# Patient Record
Sex: Female | Born: 1960 | Race: Black or African American | Hispanic: No | Smoking: Never smoker
Health system: Southern US, Community
[De-identification: ages and names within clinical notes are randomized; demographics above are authoritative.]

## PROBLEM LIST (undated history)

## (undated) DIAGNOSIS — F32A Depression, unspecified: Secondary | ICD-10-CM

## (undated) DIAGNOSIS — R079 Chest pain, unspecified: Secondary | ICD-10-CM

## (undated) DIAGNOSIS — D649 Anemia, unspecified: Secondary | ICD-10-CM

## (undated) DIAGNOSIS — M1990 Unspecified osteoarthritis, unspecified site: Secondary | ICD-10-CM

## (undated) DIAGNOSIS — F329 Major depressive disorder, single episode, unspecified: Secondary | ICD-10-CM

## (undated) DIAGNOSIS — E559 Vitamin D deficiency, unspecified: Secondary | ICD-10-CM

## (undated) DIAGNOSIS — J45909 Unspecified asthma, uncomplicated: Secondary | ICD-10-CM

## (undated) DIAGNOSIS — D219 Benign neoplasm of connective and other soft tissue, unspecified: Secondary | ICD-10-CM

## (undated) DIAGNOSIS — G4700 Insomnia, unspecified: Secondary | ICD-10-CM

## (undated) DIAGNOSIS — R002 Palpitations: Secondary | ICD-10-CM

## (undated) DIAGNOSIS — K219 Gastro-esophageal reflux disease without esophagitis: Secondary | ICD-10-CM

## (undated) DIAGNOSIS — Z91018 Allergy to other foods: Secondary | ICD-10-CM

## (undated) DIAGNOSIS — R0602 Shortness of breath: Secondary | ICD-10-CM

## (undated) DIAGNOSIS — M2550 Pain in unspecified joint: Secondary | ICD-10-CM

## (undated) HISTORY — DX: Gastro-esophageal reflux disease without esophagitis: K21.9

## (undated) HISTORY — DX: Unspecified osteoarthritis, unspecified site: M19.90

## (undated) HISTORY — DX: Benign neoplasm of connective and other soft tissue, unspecified: D21.9

## (undated) HISTORY — DX: Depression, unspecified: F32.A

## (undated) HISTORY — DX: Vitamin D deficiency, unspecified: E55.9

## (undated) HISTORY — DX: Chest pain, unspecified: R07.9

## (undated) HISTORY — PX: ABDOMINAL HYSTERECTOMY: SHX81

## (undated) HISTORY — PX: KNEE SURGERY: SHX244

## (undated) HISTORY — PX: OOPHORECTOMY: SHX86

## (undated) HISTORY — DX: Pain in unspecified joint: M25.50

## (undated) HISTORY — PX: HERNIA REPAIR: SHX51

## (undated) HISTORY — DX: Anemia, unspecified: D64.9

## (undated) HISTORY — DX: Insomnia, unspecified: G47.00

## (undated) HISTORY — DX: Allergy to other foods: Z91.018

---
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
## 1999-08-27 ENCOUNTER — Other Ambulatory Visit: Admission: RE | Admit: 1999-08-27 | Discharge: 1999-08-27 | Payer: Self-pay | Admitting: *Deleted

## 1999-10-23 ENCOUNTER — Encounter (INDEPENDENT_AMBULATORY_CARE_PROVIDER_SITE_OTHER): Payer: Self-pay | Admitting: Specialist

## 1999-10-23 ENCOUNTER — Inpatient Hospital Stay (HOSPITAL_COMMUNITY): Admission: RE | Admit: 1999-10-23 | Discharge: 1999-10-24 | Payer: Self-pay | Admitting: *Deleted

## 2000-05-28 ENCOUNTER — Emergency Department (HOSPITAL_COMMUNITY): Admission: EM | Admit: 2000-05-28 | Discharge: 2000-05-28 | Payer: Self-pay | Admitting: *Deleted

## 2000-09-21 ENCOUNTER — Emergency Department (HOSPITAL_COMMUNITY): Admission: EM | Admit: 2000-09-21 | Discharge: 2000-09-21 | Payer: Self-pay | Admitting: Emergency Medicine

## 2000-11-28 ENCOUNTER — Encounter: Payer: Self-pay | Admitting: *Deleted

## 2000-11-28 ENCOUNTER — Encounter: Admission: RE | Admit: 2000-11-28 | Discharge: 2000-11-28 | Payer: Self-pay | Admitting: *Deleted

## 2000-11-28 ENCOUNTER — Other Ambulatory Visit: Admission: RE | Admit: 2000-11-28 | Discharge: 2000-11-28 | Payer: Self-pay | Admitting: *Deleted

## 2002-05-06 ENCOUNTER — Other Ambulatory Visit: Admission: RE | Admit: 2002-05-06 | Discharge: 2002-05-06 | Payer: Self-pay | Admitting: *Deleted

## 2003-05-30 ENCOUNTER — Other Ambulatory Visit: Admission: RE | Admit: 2003-05-30 | Discharge: 2003-05-30 | Payer: Self-pay | Admitting: *Deleted

## 2003-06-02 ENCOUNTER — Encounter: Payer: Self-pay | Admitting: *Deleted

## 2003-06-02 ENCOUNTER — Encounter: Admission: RE | Admit: 2003-06-02 | Discharge: 2003-06-02 | Payer: Self-pay | Admitting: *Deleted

## 2003-07-18 ENCOUNTER — Emergency Department (HOSPITAL_COMMUNITY): Admission: EM | Admit: 2003-07-18 | Discharge: 2003-07-19 | Payer: Self-pay | Admitting: Emergency Medicine

## 2004-06-17 ENCOUNTER — Emergency Department (HOSPITAL_COMMUNITY): Admission: EM | Admit: 2004-06-17 | Discharge: 2004-06-17 | Payer: Self-pay | Admitting: Emergency Medicine

## 2004-11-30 ENCOUNTER — Emergency Department (HOSPITAL_COMMUNITY): Admission: EM | Admit: 2004-11-30 | Discharge: 2004-11-30 | Payer: Self-pay | Admitting: Family Medicine

## 2005-08-02 ENCOUNTER — Encounter: Admission: RE | Admit: 2005-08-02 | Discharge: 2005-08-02 | Payer: Self-pay | Admitting: Internal Medicine

## 2006-09-05 ENCOUNTER — Observation Stay (HOSPITAL_COMMUNITY): Admission: EM | Admit: 2006-09-05 | Discharge: 2006-09-06 | Payer: Self-pay | Admitting: Family Medicine

## 2008-01-19 ENCOUNTER — Other Ambulatory Visit: Admission: RE | Admit: 2008-01-19 | Discharge: 2008-01-19 | Payer: Self-pay | Admitting: Internal Medicine

## 2008-03-02 ENCOUNTER — Encounter: Admission: RE | Admit: 2008-03-02 | Discharge: 2008-03-02 | Payer: Self-pay | Admitting: Internal Medicine

## 2009-03-03 ENCOUNTER — Encounter: Admission: RE | Admit: 2009-03-03 | Discharge: 2009-03-03 | Payer: Self-pay | Admitting: Internal Medicine

## 2009-03-06 ENCOUNTER — Other Ambulatory Visit: Admission: RE | Admit: 2009-03-06 | Discharge: 2009-03-06 | Payer: Self-pay | Admitting: Internal Medicine

## 2009-03-16 ENCOUNTER — Encounter: Admission: RE | Admit: 2009-03-16 | Discharge: 2009-03-16 | Payer: Self-pay | Admitting: Internal Medicine

## 2009-10-02 HISTORY — PX: CORONARY ANGIOGRAM: SHX5786

## 2009-10-22 ENCOUNTER — Observation Stay (HOSPITAL_COMMUNITY): Admission: EM | Admit: 2009-10-22 | Discharge: 2009-10-24 | Payer: Self-pay | Admitting: Emergency Medicine

## 2010-03-05 ENCOUNTER — Encounter: Admission: RE | Admit: 2010-03-05 | Discharge: 2010-03-05 | Payer: Self-pay | Admitting: Internal Medicine

## 2010-10-09 ENCOUNTER — Emergency Department (HOSPITAL_COMMUNITY): Admission: EM | Admit: 2010-10-09 | Discharge: 2010-10-09 | Payer: Self-pay | Admitting: Emergency Medicine

## 2011-02-12 LAB — POCT I-STAT, CHEM 8
BUN: 8 mg/dL (ref 6–23)
Calcium, Ion: 1.22 mmol/L (ref 1.12–1.32)
Chloride: 108 mEq/L (ref 96–112)
Creatinine, Ser: 0.9 mg/dL (ref 0.4–1.2)
Glucose, Bld: 93 mg/dL (ref 70–99)
HCT: 41 % (ref 36.0–46.0)
Hemoglobin: 13.9 g/dL (ref 12.0–15.0)
Potassium: 4.3 mEq/L (ref 3.5–5.1)
Sodium: 140 mEq/L (ref 135–145)
TCO2: 23 mmol/L (ref 0–100)

## 2011-02-12 LAB — DIFFERENTIAL
Basophils Absolute: 0 10*3/uL (ref 0.0–0.1)
Basophils Relative: 0 % (ref 0–1)
Eosinophils Absolute: 0.1 10*3/uL (ref 0.0–0.7)
Eosinophils Relative: 1 % (ref 0–5)
Lymphocytes Relative: 25 % (ref 12–46)
Lymphs Abs: 2.6 10*3/uL (ref 0.7–4.0)
Monocytes Absolute: 0.7 10*3/uL (ref 0.1–1.0)
Monocytes Relative: 7 % (ref 3–12)
Neutro Abs: 6.9 10*3/uL (ref 1.7–7.7)
Neutrophils Relative %: 67 % (ref 43–77)

## 2011-02-12 LAB — POCT CARDIAC MARKERS
CKMB, poc: <1 ng/mL — ABNORMAL LOW (ref 1.0–8.0)
CKMB, poc: <1 ng/mL — ABNORMAL LOW (ref 1.0–8.0)
Myoglobin, poc: 41.1 ng/mL (ref 12–200)
Myoglobin, poc: 41.9 ng/mL (ref 12–200)
Troponin i, poc: <0.05 ng/mL (ref 0.00–0.09)
Troponin i, poc: <0.05 ng/mL (ref 0.00–0.09)

## 2011-02-12 LAB — CBC
HCT: 37.5 % (ref 36.0–46.0)
Hemoglobin: 12.7 g/dL (ref 12.0–15.0)
MCH: 31.3 pg (ref 26.0–34.0)
MCHC: 33.9 g/dL (ref 30.0–36.0)
MCV: 92.4 fL (ref 78.0–100.0)
Platelets: 286 10*3/uL (ref 150–400)
RBC: 4.06 MIL/uL (ref 3.87–5.11)
RDW: 12.9 % (ref 11.5–15.5)
WBC: 10.3 10*3/uL (ref 4.0–10.5)

## 2011-02-13 ENCOUNTER — Other Ambulatory Visit: Payer: Self-pay | Admitting: Internal Medicine

## 2011-02-13 DIAGNOSIS — Z1231 Encounter for screening mammogram for malignant neoplasm of breast: Secondary | ICD-10-CM

## 2011-03-06 LAB — DIFFERENTIAL
Basophils Absolute: 0.1 10*3/uL (ref 0.0–0.1)
Basophils Relative: 1 % (ref 0–1)
Eosinophils Absolute: 0.1 10*3/uL (ref 0.0–0.7)
Eosinophils Relative: 1 % (ref 0–5)
Lymphocytes Relative: 30 % (ref 12–46)
Lymphs Abs: 2.9 10*3/uL (ref 0.7–4.0)
Monocytes Absolute: 0.5 10*3/uL (ref 0.1–1.0)
Monocytes Relative: 5 % (ref 3–12)
Neutro Abs: 6.2 10*3/uL (ref 1.7–7.7)
Neutrophils Relative %: 64 % (ref 43–77)

## 2011-03-06 LAB — CBC
HCT: 32.2 % — ABNORMAL LOW (ref 36.0–46.0)
HCT: 32.9 % — ABNORMAL LOW (ref 36.0–46.0)
HCT: 34.6 % — ABNORMAL LOW (ref 36.0–46.0)
Hemoglobin: 10.8 g/dL — ABNORMAL LOW (ref 12.0–15.0)
Hemoglobin: 11 g/dL — ABNORMAL LOW (ref 12.0–15.0)
Hemoglobin: 11.7 g/dL — ABNORMAL LOW (ref 12.0–15.0)
MCHC: 33.3 g/dL (ref 30.0–36.0)
MCHC: 33.6 g/dL (ref 30.0–36.0)
MCHC: 33.7 g/dL (ref 30.0–36.0)
MCV: 93.1 fL (ref 78.0–100.0)
MCV: 93.4 fL (ref 78.0–100.0)
MCV: 93.9 fL (ref 78.0–100.0)
Platelets: 294 10*3/uL (ref 150–400)
Platelets: 304 10*3/uL (ref 150–400)
Platelets: 310 10*3/uL (ref 150–400)
RBC: 3.44 MIL/uL — ABNORMAL LOW (ref 3.87–5.11)
RBC: 3.51 MIL/uL — ABNORMAL LOW (ref 3.87–5.11)
RBC: 3.72 MIL/uL — ABNORMAL LOW (ref 3.87–5.11)
RDW: 13.2 % (ref 11.5–15.5)
RDW: 13.5 % (ref 11.5–15.5)
RDW: 13.8 % (ref 11.5–15.5)
WBC: 9.3 10*3/uL (ref 4.0–10.5)
WBC: 9.4 10*3/uL (ref 4.0–10.5)
WBC: 9.7 10*3/uL (ref 4.0–10.5)

## 2011-03-06 LAB — TSH: TSH: 1.351 u[IU]/mL (ref 0.350–4.500)

## 2011-03-06 LAB — CK TOTAL AND CKMB (NOT AT ARMC)
CK, MB: 0.4 ng/mL (ref 0.3–4.0)
CK, MB: 0.4 ng/mL (ref 0.3–4.0)
CK, MB: 0.4 ng/mL (ref 0.3–4.0)
Relative Index: INVALID (ref 0.0–2.5)
Relative Index: INVALID (ref 0.0–2.5)
Relative Index: INVALID (ref 0.0–2.5)
Total CK: 51 U/L (ref 7–177)
Total CK: 55 U/L (ref 7–177)
Total CK: 59 U/L (ref 7–177)

## 2011-03-06 LAB — BASIC METABOLIC PANEL
BUN: 7 mg/dL (ref 6–23)
CO2: 25 mEq/L (ref 19–32)
Calcium: 8.9 mg/dL (ref 8.4–10.5)
Chloride: 106 mEq/L (ref 96–112)
Creatinine, Ser: 0.83 mg/dL (ref 0.4–1.2)
GFR calc Af Amer: >60 mL/min (ref >60)
GFR calc non Af Amer: >60 mL/min (ref >60)
Glucose, Bld: 89 mg/dL (ref 70–99)
Potassium: 3.8 mEq/L (ref 3.5–5.1)
Sodium: 138 mEq/L (ref 135–145)

## 2011-03-06 LAB — COMPREHENSIVE METABOLIC PANEL
ALT: 12 U/L (ref 0–35)
AST: 18 U/L (ref 0–37)
Albumin: 3.4 g/dL — ABNORMAL LOW (ref 3.5–5.2)
Alkaline Phosphatase: 61 U/L (ref 39–117)
BUN: 8 mg/dL (ref 6–23)
CO2: 26 mEq/L (ref 19–32)
Calcium: 8.6 mg/dL (ref 8.4–10.5)
Chloride: 109 mEq/L (ref 96–112)
Creatinine, Ser: 0.89 mg/dL (ref 0.4–1.2)
GFR calc Af Amer: >60 mL/min (ref >60)
GFR calc non Af Amer: >60 mL/min (ref >60)
Glucose, Bld: 109 mg/dL — ABNORMAL HIGH (ref 70–99)
Potassium: 3.4 mEq/L — ABNORMAL LOW (ref 3.5–5.1)
Sodium: 140 mEq/L (ref 135–145)
Total Bilirubin: 0.6 mg/dL (ref 0.3–1.2)
Total Protein: 6.4 g/dL (ref 6.0–8.3)

## 2011-03-06 LAB — POCT CARDIAC MARKERS
CKMB, poc: <1 ng/mL — ABNORMAL LOW (ref 1.0–8.0)
Myoglobin, poc: 43.6 ng/mL (ref 12–200)
Troponin i, poc: <0.05 ng/mL (ref 0.00–0.09)

## 2011-03-06 LAB — T4, FREE: Free T4: 1.07 ng/dL (ref 0.80–1.80)

## 2011-03-06 LAB — LIPID PANEL
Cholesterol: 132 mg/dL (ref 0–200)
HDL: 41 mg/dL (ref >39)
LDL Cholesterol: 82 mg/dL (ref 0–99)
Total CHOL/HDL Ratio: 3.2 RATIO
Triglycerides: 44 mg/dL (ref <150)
VLDL: 9 mg/dL (ref 0–40)

## 2011-03-06 LAB — APTT
aPTT: 39 seconds — ABNORMAL HIGH (ref 24–37)
aPTT: 40 seconds — ABNORMAL HIGH (ref 24–37)

## 2011-03-06 LAB — LIPASE, BLOOD: Lipase: 16 U/L (ref 11–59)

## 2011-03-06 LAB — D-DIMER, QUANTITATIVE: D-Dimer, Quant: 0.3 ug/mL-FEU (ref 0.00–0.48)

## 2011-03-06 LAB — AMYLASE: Amylase: 84 U/L (ref 27–131)

## 2011-03-12 ENCOUNTER — Ambulatory Visit
Admission: RE | Admit: 2011-03-12 | Discharge: 2011-03-12 | Disposition: A | Discharge status: Home / Self Care | Payer: BLUE CROSS/BLUE SHIELD | Source: Ambulatory Visit | Attending: Internal Medicine | Admitting: Internal Medicine

## 2011-03-12 DIAGNOSIS — Z1231 Encounter for screening mammogram for malignant neoplasm of breast: Secondary | ICD-10-CM

## 2011-04-19 NOTE — H&P (Signed)
Angela Mcconnell, UNGERER NO.:  [id number]   MEDICAL RECORD NO.:  [id number]          PATIENT TYPE:  OBV   LOCATION:  6526                         FACILITY:  [facility]   PHYSICIAN:  Jackie Plum, M.D.DATE OF BIRTH:  1961-04-26   DATE OF ADMISSION:  09/05/2006  DATE OF DISCHARGE:                                HISTORY & PHYSICAL   CHIEF COMPLAINT:  Chest pain.   HISTORY OF PRESENT ILLNESS:  The patient is a 50 year old lady without any  previous history of hypertension, diabetes, or dyslipidemia.  The patient  does not smoke cigarettes.  She has not had any heart problems before.  She  denies any history of reflux esophagitis or any reflux disease.  She says  she has had intermittent chest pain which is pressure like and has not had  any radiation.  No diaphoresis, dizziness, dyspnea, fever, or chills.  The  patient came to the ED whereupon, 12 lead EKG was done which showed sinus  rhythm at 68 beats per minute with right bundle branch block.  There was no  acute ST-T wave changes.  The patient, on an x-ray, was diagnosed with  cardiomegaly.  The patient was admitted for further evaluation.  I discussed  the patient with Dr. Elsie Lincoln of [facility] and he agrees that  the patient, who is chest pain free now, is OK for cycling of cardiac  enzymes and if these are negative, the patient can be referred to his  office.  The office has written out the patient's address and they are going  to try to arrange for a stress test as an outpatient if she rules out.   PAST MEDICAL HISTORY:  As noted above.   FAMILY HISTORY:  Positive for hypertension.  No family history of heart  disease.   MEDICATION HISTORY:  The patient is not on any medications at the moment.   ALLERGIES:  EGGS which causes hives.   PHYSICAL EXAMINATION:  VITAL SIGNS:  Blood pressure 124/33, pulse 71, respirations 20, temperature  98.1, O2 saturation 100%.  GENERAL:  Not in acute  cardiopulmonary distress.  HEENT:  Normocephalic, atraumatic, pupils equal, round, reactive to light.  EXTREMITIES:  No cyanosis, no edema.  ABDOMEN:  Soft, nontender.  Bowel sounds present.  LUNGS:  Clear to auscultation.  CARDIAC:  Regular rate and rhythm, no gallops or murmur.  CNS:  Alert and appropriate.   LABORATORY DATA:  EKG and x-ray as noted above.  WBC 10, hemoglobin 12.8,  hematocrit 38.3, MCV 95.4, platelet count 312.  Sodium 137, potassium 4.4,  chloride 107, CO2 25, glucose 89, BUN 8, creatinine 0.9, calcium 8.9.  Point  of care cardiac markers were negative.   IMPRESSION:  Chest pain, resolved.  The patient is admitted for cycling  cardiac enzymes.  If negative for MI, she will be discharged with  appointment to follow up with [facility] for possible stress  test.  We will check her lipid panel.      Jackie Plum, M.D.  Electronically Signed     GO/MEDQ  D:  09/05/2006  T:  09/06/2006  Job:  [id number]

## 2011-04-19 NOTE — Discharge Summary (Signed)
Angela Mcconnell, Angela [name]:  [id number]   MEDICAL RECORD NO.:  [id number]          PATIENT TYPE:  OBV   LOCATION:  3743                         FACILITY:  [facility]   PHYSICIAN:  Jackie Plum, M.D.DATE OF BIRTH:  10-14-1961   DATE OF ADMISSION:  09/05/2006  DATE OF DISCHARGE:  09/06/2006                                 DISCHARGE SUMMARY   DISCHARGE DIAGNOSES:  1. Chest pain, resolved.      a.     Serial cardiac enzymes negative.      b.     Patient is going to follow up with cardiology as an outpatient.  2. Abnormal electrocardiogram with right bundle-branch block.  Patient      follow up recommended, per cardiology.   DISCHARGE MEDICATIONS:  The patient is going to continue the home  medications of Imitrex, albuterol, B-complex, vitamin C as previously.  New  medicine is Protonix 40 mg daily.   REASON FOR ADMISSION:  Chest pain.  Angela Mcconnell is a very pleasant 50-year-  old African American lady who presented to the ER with complaint of chest  pain, which was atypical in presentation.  She does not have any known  history of diabetes, hypertension, dyslipidemia.  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  In the emergency room, 12-lead EKG showed right bundle-branch block  without any acute ST wave changes.  Her chest pain is said to have been  relieved with nitroglycerin, according to the ED doctor, and she is admitted  for further evaluation and management.  Her cardiac enzymes were serially  obtained, which came back negative for MI.  She is chest pain free this  morning.  I discussed the patient with Dr. Elsie Lincoln in the emergency room, who  agreed that patient would be okay for outpatient evaluation by cardiology on  discharge.  She is going to be discharged home on Protonix 40 mg daily and I  have asked her to take some time  off of work.  I also added Xanax, she  declined and said that she would be fine with just some days off.  Possibly,  her chest pain is related to anxiety.  We cannot rule out any GI source of  pain other cardiac source at this time.  She is going to be discharged to  follow up with Dr. Elsie Lincoln next week.  Patient is discharged in stable and  satisfactory condition.   Her discharge exam was unremarkable for cardiopulmonary examination.  She  was alert and appropriate.  Her abdomen was soft, nontender.  Extremities  had no cyanosis.  Her CBC and chemistries were not repeated.  Her total  cholesterol was133, triglycerides 70, HDL 58, LDL 81.   DISCHARGE VITAL SIGNS:  BP 115/60, pulse 81, respirations 20, temperature  97.7, O2 sat of 100% on room air.      Jackie Plum, M.D.  Electronically Signed     GO/MEDQ  D:  09/06/2006  T:  09/06/2006  Job:  [id number]   cc:   Madaline Savage, M.D.

## 2011-04-19 NOTE — Discharge Summary (Signed)
[facility]  Patient:    Angela Mcconnell                         MRN: [id number] Adm. Date:  10/23/99 Disc. Date: 10/24/99 Attending:  Georgina Peer, M.D.                           Discharge Summary  ADMISSION DIAGNOSES:          1. Uterine fibroids.                               2. Abdominal pain.                               3. Anemia.  DISCHARGE DIAGNOSES:          1. Uterine fibroids.                               2. Abdominal pain.                               3. Anemia.  PROCEDURE:                    Laparoscopically-assisted vaginal hysterectomy.  BRIEF HISTORY:                This is a 50 year old black female, gravida 3, para 2, with enlarging fibroids causing pelvic pain and heavy bleeding.  HOSPITAL COURSE:              The patient was admitted for a laparoscopically-assisted vaginal hysterectomy which she underwent this on November 21, under general anesthesia.  Estimated blood loss was 150 cc.  The operation as uneventful.  The patient had adequate pain relief and minimal bleeding.  She voided without difficulty.  On postoperative day #1, she was passing flatus, ambulating, and tolerating a regular diet.  Hemoglobin went from 12.0 to 11.2.  Abdomen was  soft and nontender.  Her incisional ports were intact.  She was discharged home to follow up in the office in two weeks.  She was advised to avoid heavy lifting, intercourse, and driving until her two-week visit. DD:  12/13/99 TD:  12/13/99 Job: [id number]

## 2011-05-28 ENCOUNTER — Encounter (HOSPITAL_COMMUNITY)
Admission: RE | Admit: 2011-05-28 | Discharge: 2011-05-28 | Disposition: A | Discharge status: Home / Self Care | Payer: BC Managed Care – PPO | Source: Ambulatory Visit | Attending: Obstetrics and Gynecology | Admitting: Obstetrics and Gynecology

## 2011-05-28 LAB — CBC
HCT: 35.5 % — ABNORMAL LOW (ref 36.0–46.0)
Hemoglobin: 11.6 g/dL — ABNORMAL LOW (ref 12.0–15.0)
MCH: 30.1 pg (ref 26.0–34.0)
MCHC: 32.7 g/dL (ref 30.0–36.0)
MCV: 92.2 fL (ref 78.0–100.0)
Platelets: 327 10*3/uL (ref 150–400)
RBC: 3.85 MIL/uL — ABNORMAL LOW (ref 3.87–5.11)
RDW: 14 % (ref 11.5–15.5)
WBC: 10.8 10*3/uL — ABNORMAL HIGH (ref 4.0–10.5)

## 2011-05-28 LAB — SURGICAL PCR SCREEN
MRSA, PCR: POSITIVE — AB
Staphylococcus aureus: POSITIVE — AB

## 2011-06-04 ENCOUNTER — Ambulatory Visit (HOSPITAL_COMMUNITY)
Admission: RE | Admit: 2011-06-04 | Discharge: 2011-06-04 | Disposition: A | Discharge status: Home / Self Care | Payer: BC Managed Care – PPO | Source: Ambulatory Visit | Attending: Obstetrics and Gynecology | Admitting: Obstetrics and Gynecology

## 2011-06-04 ENCOUNTER — Other Ambulatory Visit: Payer: Self-pay | Admitting: Obstetrics and Gynecology

## 2011-06-04 DIAGNOSIS — R1031 Right lower quadrant pain: Secondary | ICD-10-CM | POA: Insufficient documentation

## 2011-06-04 DIAGNOSIS — Z01818 Encounter for other preprocedural examination: Secondary | ICD-10-CM | POA: Insufficient documentation

## 2011-06-04 DIAGNOSIS — Z01812 Encounter for preprocedural laboratory examination: Secondary | ICD-10-CM | POA: Insufficient documentation

## 2011-06-04 DIAGNOSIS — N83209 Unspecified ovarian cyst, unspecified side: Secondary | ICD-10-CM | POA: Insufficient documentation

## 2011-06-04 LAB — URINALYSIS, ROUTINE W REFLEX MICROSCOPIC
Bilirubin Urine: NEGATIVE
Glucose, UA: NEGATIVE mg/dL
Hgb urine dipstick: NEGATIVE
Ketones, ur: NEGATIVE mg/dL
Leukocytes, UA: NEGATIVE
Nitrite: NEGATIVE
Protein, ur: NEGATIVE mg/dL
Specific Gravity, Urine: <1.005 — ABNORMAL LOW (ref 1.005–1.030)
Urobilinogen, UA: 0.2 mg/dL (ref 0.0–1.0)
pH: 7 (ref 5.0–8.0)

## 2011-06-04 LAB — BASIC METABOLIC PANEL
BUN: 9 mg/dL (ref 6–23)
CO2: 25 mEq/L (ref 19–32)
Calcium: 9.2 mg/dL (ref 8.4–10.5)
Chloride: 105 mEq/L (ref 96–112)
Creatinine, Ser: 0.95 mg/dL (ref 0.50–1.10)
GFR calc Af Amer: >60 mL/min (ref >60)
GFR calc non Af Amer: >60 mL/min (ref >60)
Glucose, Bld: 81 mg/dL (ref 70–99)
Potassium: 3.6 mEq/L (ref 3.5–5.1)
Sodium: 139 mEq/L (ref 135–145)

## 2011-06-08 NOTE — Op Note (Signed)
Angela [name]:  [id number]  MEDICAL RECORD NO.:  [id number]  LOCATION:  [facility]                          FACILITY:  [facility]  PHYSICIAN:  Randye Lobo, M.D.   DATE OF BIRTH:  [birth year]  DATE OF PROCEDURE:  06/04/2011 DATE OF DISCHARGE:                              OPERATIVE REPORT   PREOPERATIVE DIAGNOSIS:  Right lower quadrant pain.  POSTOPERATIVE DIAGNOSES: 1. Right lower quadrant pain 2. Pelvic adhesions.  PROCEDURES:  Laparoscopy with lysis of adhesions and bilateral salpingo- oophorectomy.  SURGEON:  Randye Lobo, MD  ASSISTANT:  Luvenia Redden, MD  ANESTHESIA:  General endotracheal, local with 0.25% Marcaine.  IV FLUIDS:  1300 mL Ringer's lactate.  ESTIMATED BLOOD LOSS:  Minimal.  URINE OUTPUT:  400 mL.  COMPLICATIONS:  None.  INDICATIONS FOR THE PROCEDURE:  The patient is a 50 year old gravida 3, para 1-1-1-2 African American female who presents with chronic right lower quadrant pain of several years' duration.  The patient is status post laparoscopically-assisted vaginal hysterectomy in 2001 for symptomatic uterine fibroids.  The patient's pain persists despite use of nonsteroidal anti-inflammatory medication and heat.  She has used oral contraceptive pills in the past.  The patient reports a history of chronic ovarian cysts.  The patient is now desiring surgical evaluation and treatment of pain and a plan is made to proceed with a laparoscopy with anticipated lysis of adhesions and bilateral salpingo-oophorectomy. Risks, benefits, and alternatives have been discussed with the patient who wishes to proceed.  FINDINGS:  Laparoscopy demonstrated an absent uterus.  There were dense adhesions between the omentum and the vaginal cuff.  Thin omental adhesions extended across the entire pelvic floor which appeared to be along the patient's previous surgical site.  There was a small left ovarian cyst.  The right ovary and bilateral  fallopian tubes were unremarkable.  The appendix was normal.  It was visualized all the way to the tips.  In the upper abdomen, the liver was unremarkable.  There was no evidence of any adhesive disease in the upper abdomen.  SPECIMENS:  The bilateral tubes and ovaries were sent to Pathology separately.  PROCEDURE IN DETAILS:  The patient was reidentified in the preoperative hold area.  She received Ancef IV for antibiotic prophylaxis.  She received TED hose and PAS stockings for DVT prophylaxis.  In the operating room, general endotracheal anesthesia was induced and the patient was placed in the dorsal lithotomy position.  The abdomen, vagina and perineum were all sterilely prepped and draped.  A Foley catheter was placed in the bladder and left to gravity drainage throughout the procedure.  A sponge on sponge stick was placed in the vagina for the procedure.  Attention was then turned to the abdomen where a transverse infraumbilical incision was created sharply with a scalpel.  An Allis clamp was used to dissect down to the fascia.  A 10-mm trocar was then placed into the peritoneal cavity without difficulty.  The laparoscope confirmed proper placement.  The patient was placed in the Trendelenburg position.  A 5 mm right and left lower quadrant incisions were created with scalpels and the trocars were placed under the direct  visualization of the laparoscope.  The procedure then began by performing a sharp lysis of adhesions of the omental adhesions from the vaginal cuff.  Monopolar cautery and the gyrus instrument were used to create hemostasis along the omental side of the dissection.  Care was taken to ensure that there was no bowel in the region of the dissection.  After the omentum was 100% freed from the vaginal cuff, attention was then turned to the adnexa regions.  The left ureter was identified.  The left infundibulopelvic ligament was then triply cauterized and cut  with the Gyrus instrument.  Dissection was continued through the broad ligament again using the Gyrus instrument for cautery and for cutting. The left tube and ovary were freed at this time and were placed inside the cul-de-sac.  The same procedure was then performed on the right-hand side after the right ureter had been identified.  The right lower quadrant incision was then enlarged to accommodate a 10/11-mm trocar which was placed under visualization of the laparoscope. The EndoCatch bag was then placed inside the peritoneal cavity and the adnexal structures were placed inside the EndoCatch bag and were removed from the right lower quadrant incision identified, and sent separately to Pathology.  The pelvis was then irrigated and suctioned at this time.  A few additional areas along the omentum were cauterized to create good hemostasis.  A piece of Interceed was then placed across the vaginal cuff.  All operative sites were hemostatic prior to placing this.  The lower abdominal trocars were removed under visualization of the laparoscope.  A CO2 pneumoperitoneum was released and the umbilical trocar and laparoscope were removed simultaneously.  The right lower quadrant incision was closed along the fascia with 2 figure-of-eight sutures of 0 Vicryl.  All skin incisions were closed with subcuticular sutures of 3-0 plain gut suture and Dermabond was placed over the incisions.  The vaginal sponge and sponge stick were removed from the vagina and a Foley catheter was taken out.  The patient was awakened, extubated and escorted to the recovery room in stable condition.  There were no complications to the procedure.  All needle, instrument, and sponge counts were correct.     Randye Lobo, M.D.     BES/MEDQ  D:  06/04/2011  T:  06/05/2011  Job:  [id number]  Electronically Signed by Conley Simmonds M.D. on 06/08/2011 09:46:39 AM

## 2012-02-07 ENCOUNTER — Other Ambulatory Visit: Payer: Self-pay | Admitting: Internal Medicine

## 2012-02-07 DIAGNOSIS — Z1231 Encounter for screening mammogram for malignant neoplasm of breast: Secondary | ICD-10-CM

## 2012-03-12 ENCOUNTER — Ambulatory Visit
Admission: RE | Admit: 2012-03-12 | Discharge: 2012-03-12 | Disposition: A | Discharge status: Home / Self Care | Payer: BC Managed Care – PPO | Source: Ambulatory Visit | Attending: Internal Medicine | Admitting: Internal Medicine

## 2012-03-12 DIAGNOSIS — Z1231 Encounter for screening mammogram for malignant neoplasm of breast: Secondary | ICD-10-CM

## 2012-09-11 ENCOUNTER — Emergency Department (HOSPITAL_COMMUNITY)
Admission: EM | Admit: 2012-09-11 | Discharge: 2012-09-11 | Disposition: A | Discharge status: Home / Self Care | Payer: BC Managed Care – PPO | Attending: Emergency Medicine | Admitting: Emergency Medicine

## 2012-09-11 ENCOUNTER — Encounter (HOSPITAL_COMMUNITY): Payer: Self-pay | Admitting: *Deleted

## 2012-09-11 DIAGNOSIS — N390 Urinary tract infection, site not specified: Secondary | ICD-10-CM | POA: Insufficient documentation

## 2012-09-11 DIAGNOSIS — Z91012 Allergy to eggs: Secondary | ICD-10-CM | POA: Insufficient documentation

## 2012-09-11 DIAGNOSIS — J45909 Unspecified asthma, uncomplicated: Secondary | ICD-10-CM | POA: Insufficient documentation

## 2012-09-11 HISTORY — DX: Unspecified asthma, uncomplicated: J45.909

## 2012-09-11 LAB — URINALYSIS, ROUTINE W REFLEX MICROSCOPIC
Glucose, UA: NEGATIVE mg/dL
Ketones, ur: 15 mg/dL — AB
Nitrite: POSITIVE — AB
Protein, ur: 100 mg/dL — AB
Specific Gravity, Urine: 1.017 (ref 1.005–1.030)
Urobilinogen, UA: 1 mg/dL (ref 0.0–1.0)
pH: 5 (ref 5.0–8.0)

## 2012-09-11 LAB — URINE MICROSCOPIC-ADD ON

## 2012-09-11 MED ORDER — SULFAMETHOXAZOLE-TMP DS 800-160 MG PO TABS
1.0000 | ORAL_TABLET | Freq: Once | ORAL | Status: AC
  Administered 2012-09-11: 1 via ORAL
  Filled 2012-09-11: qty 1

## 2012-09-11 MED ORDER — SULFAMETHOXAZOLE-TRIMETHOPRIM 800-160 MG PO TABS: 1.0000 | ORAL_TABLET | Freq: Two times a day (BID) | ORAL | Status: DC

## 2012-09-11 NOTE — ED Provider Notes (Signed)
History     CSN: [id number]  Arrival date & time 09/11/12  2841   First MD Initiated Contact with Patient 09/11/12 [phone]      Chief Complaint  Patient presents with  . Hematuria    (Consider location/radiation/quality/duration/timing/severity/associated sxs/prior treatment) HPI  51 year old female presents complaining of right flank pain and hematuria. Reports yesterday she noticed and achy and dull sensation to her right low back. Onset is gradual, wax and wane, does radiates to both thigh but has improved.  This AM she experience urinary urgency and frequency and begin to notice blood in her urine.  Initially it was trace of blood but it has increase and she is seeing small clots with it.  Denies vaginal bleeding, discharge, abd pain.  Denies fever, chills, n/v/d.  No significant hx of URI, no prior hx of kidney stone.  Currently denies any significant back pain or flank pain.  Pt has total hysterectomy.  Past Medical History  Diagnosis Date  . Asthma     Past Surgical History  Procedure Date  . Abdominal hysterectomy   . Knee surgery     No family history on file.  History  Substance Use Topics  . Smoking status: Not on file  . Smokeless tobacco: Not on file  . Alcohol Use: No    OB History    Grav Para Term Preterm Abortions TAB SAB Ect Mult Living                  Review of Systems  Constitutional: Negative for fever.  Gastrointestinal: Negative for abdominal pain, blood in stool and anal bleeding.  Genitourinary: Positive for dysuria and hematuria.  Skin: Negative for rash and wound.  All other systems reviewed and are negative.    Allergies  Eggs or egg-derived products  Home Medications  No current outpatient prescriptions on file.  BP 137/85  Pulse 83  Temp 98.8 F (37.1 C) (Oral)  Resp 22  SpO2 100%  Physical Exam  Nursing note and vitals reviewed. Constitutional: She appears well-developed and well-nourished. No distress.       Awake,  alert, nontoxic appearance  HENT:  Head: Atraumatic.  Eyes: Conjunctivae normal are normal. Right eye exhibits no discharge. Left eye exhibits no discharge.  Neck: Neck supple.  Cardiovascular: Normal rate and regular rhythm.   Pulmonary/Chest: Effort normal. No respiratory distress. She exhibits no tenderness.  Abdominal: Soft. There is no tenderness. There is no rebound.       No CVA tenderness  No suprapubic tenderness  Genitourinary:       deferred  Musculoskeletal: She exhibits no tenderness.       ROM appears intact, no obvious focal weakness.  No midline spine tenderness or step off  Neurological: She is alert.       Mental status and motor strength appears intact  Skin: No rash noted.  Psychiatric: She has a normal mood and affect.    ED Course  Procedures (including critical care time)   Labs Reviewed  CBC WITH DIFFERENTIAL  URINALYSIS, ROUTINE W REFLEX MICROSCOPIC   Results for orders placed during the hospital encounter of 09/11/12  URINALYSIS, ROUTINE W REFLEX MICROSCOPIC      Component Value Range   Color, Urine RED (*) YELLOW   APPearance CLOUDY (*) CLEAR   Specific Gravity, Urine 1.017  1.005 - 1.030   pH 5.0  5.0 - 8.0   Glucose, UA NEGATIVE  NEGATIVE mg/dL   Hgb urine dipstick  LARGE (*) NEGATIVE   Bilirubin Urine MODERATE (*) NEGATIVE   Ketones, ur 15 (*) NEGATIVE mg/dL   Protein, ur 130 (*) NEGATIVE mg/dL   Urobilinogen, UA 1.0  0.0 - 1.0 mg/dL   Nitrite POSITIVE (*) NEGATIVE   Leukocytes, UA LARGE (*) NEGATIVE  URINE MICROSCOPIC-ADD ON      Component Value Range   Squamous Epithelial / LPF RARE  RARE   WBC, UA 7-10  <3 WBC/hpf   RBC / HPF TOO NUMEROUS TO COUNT  <3 RBC/hpf   Bacteria, UA FEW (*) RARE   No results found.  1. UTI  MDM  Pt presents with Dysuria and hematuria x 1-2 days.  She has no significant CVA tenderness.  She has no reproducible abd pain.  Will check UA.  Pt otherwise afebrile, no acute distress.    8:59 AM UA positive  for UTI.  Doubt kidney stone.  Will treat with bactrim, and will send urine culture.  Pt otherwise stable to be d/c.   BP 137/85  Pulse 83  Temp 98.8 F (37.1 C) (Oral)  Resp 22  SpO2 100%  Nursing notes reviewed and considered in documentation  Previous records reviewed and considered  All labs/vitals reviewed and considered         Fayrene Helper, PA-C 09/11/12 0901  Fayrene Helper, PA-C 09/11/12 [id number]

## 2012-09-11 NOTE — ED Notes (Signed)
Pt reports hematuria that started this am.  Reports lower abdominal pain and (r) flank pain.  Reports pain with urination, frequency and urgency.  No acute distress noted.  Pt sitting calmly in bed.

## 2012-09-11 NOTE — Discharge Instructions (Signed)
Urinary Tract Infection Urinary tract infections (UTIs) can develop anywhere along your urinary tract. Your urinary tract is your body's drainage system for removing wastes and extra water. Your urinary tract includes two kidneys, two ureters, a bladder, and a urethra. Your kidneys are a pair of bean-shaped organs. Each kidney is about the size of your fist. They are located below your ribs, one on each side of your spine. CAUSES Infections are caused by microbes, which are microscopic organisms, including fungi, viruses, and bacteria. These organisms are so small that they can only be seen through a microscope. Bacteria are the microbes that most commonly cause UTIs. SYMPTOMS  Symptoms of UTIs may vary by age and gender of the patient and by the location of the infection. Symptoms in young women typically include a frequent and intense urge to urinate and a painful, burning feeling in the bladder or urethra during urination. Older women and men are more likely to be tired, shaky, and weak and have muscle aches and abdominal pain. A fever may mean the infection is in your kidneys. Other symptoms of a kidney infection include pain in your back or sides below the ribs, nausea, and vomiting. DIAGNOSIS To diagnose a UTI, your caregiver will ask you about your symptoms. Your caregiver also will ask to provide a urine sample. The urine sample will be tested for bacteria and white blood cells. White blood cells are made by your body to help fight infection. TREATMENT  Typically, UTIs can be treated with medication. Because most UTIs are caused by a bacterial infection, they usually can be treated with the use of antibiotics. The choice of antibiotic and length of treatment depend on your symptoms and the type of bacteria causing your infection. HOME CARE INSTRUCTIONS  If you were prescribed antibiotics, take them exactly as your caregiver instructs you. Finish the medication even if you feel better after you  have only taken some of the medication.  Drink enough water and fluids to keep your urine clear or pale yellow.  Avoid caffeine, tea, and carbonated beverages. They tend to irritate your bladder.  Empty your bladder often. Avoid holding urine for long periods of time.  Empty your bladder before and after sexual intercourse.  After a bowel movement, women should cleanse from front to back. Use each tissue only once. SEEK MEDICAL CARE IF:   You have back pain.  You develop a fever.  Your symptoms do not begin to resolve within 3 days. SEEK IMMEDIATE MEDICAL CARE IF:   You have severe back pain or lower abdominal pain.  You develop chills.  You have nausea or vomiting.  You have continued burning or discomfort with urination. MAKE SURE YOU:   Understand these instructions.  Will watch your condition.  Will get help right away if you are not doing well or get worse. Document Released: 08/28/2005 Document Revised: 05/19/2012 Document Reviewed: 12/27/2011 [employer] Patient Information 2013 [employer].  

## 2012-09-11 NOTE — ED Notes (Signed)
Pt has has right flank pain since yesterday.  When she woke up this am she had urinary frequency and noticed blood in her urine as well as clots.

## 2012-09-13 LAB — URINE CULTURE: Colony Count: >=100000

## 2012-09-13 NOTE — ED Provider Notes (Signed)
Medical screening examination/treatment/procedure(s) were performed by non-physician practitioner and as supervising physician I was immediately available for consultation/collaboration.  Darien Mignogna T Layann Bluett, MD 09/13/12 0856 

## 2012-09-14 NOTE — ED Notes (Signed)
+   Urine Patient treated with cipro(Home Medication)-STS

## 2012-11-11 ENCOUNTER — Encounter (HOSPITAL_COMMUNITY): Payer: Self-pay | Admitting: *Deleted

## 2012-11-11 ENCOUNTER — Emergency Department (HOSPITAL_COMMUNITY)
Admission: EM | Admit: 2012-11-11 | Discharge: 2012-11-11 | Disposition: A | Discharge status: Home / Self Care | Payer: BC Managed Care – PPO | Attending: Emergency Medicine | Admitting: Emergency Medicine

## 2012-11-11 ENCOUNTER — Other Ambulatory Visit: Payer: Self-pay

## 2012-11-11 ENCOUNTER — Emergency Department (HOSPITAL_COMMUNITY): Payer: BC Managed Care – PPO

## 2012-11-11 DIAGNOSIS — J209 Acute bronchitis, unspecified: Secondary | ICD-10-CM | POA: Insufficient documentation

## 2012-11-11 DIAGNOSIS — J3489 Other specified disorders of nose and nasal sinuses: Secondary | ICD-10-CM | POA: Insufficient documentation

## 2012-11-11 DIAGNOSIS — J45909 Unspecified asthma, uncomplicated: Secondary | ICD-10-CM | POA: Insufficient documentation

## 2012-11-11 DIAGNOSIS — Z90710 Acquired absence of both cervix and uterus: Secondary | ICD-10-CM | POA: Insufficient documentation

## 2012-11-11 DIAGNOSIS — R1033 Periumbilical pain: Secondary | ICD-10-CM | POA: Insufficient documentation

## 2012-11-11 DIAGNOSIS — R002 Palpitations: Secondary | ICD-10-CM | POA: Insufficient documentation

## 2012-11-11 DIAGNOSIS — J029 Acute pharyngitis, unspecified: Secondary | ICD-10-CM | POA: Insufficient documentation

## 2012-11-11 DIAGNOSIS — R112 Nausea with vomiting, unspecified: Secondary | ICD-10-CM | POA: Insufficient documentation

## 2012-11-11 DIAGNOSIS — R509 Fever, unspecified: Secondary | ICD-10-CM | POA: Insufficient documentation

## 2012-11-11 HISTORY — DX: Palpitations: R00.2

## 2012-11-11 LAB — COMPREHENSIVE METABOLIC PANEL
ALT: 13 U/L (ref 0–35)
AST: 19 U/L (ref 0–37)
Albumin: 3.8 g/dL (ref 3.5–5.2)
Alkaline Phosphatase: 76 U/L (ref 39–117)
BUN: 11 mg/dL (ref 6–23)
CO2: 22 mEq/L (ref 19–32)
Calcium: 9.4 mg/dL (ref 8.4–10.5)
Chloride: 106 mEq/L (ref 96–112)
Creatinine, Ser: 0.78 mg/dL (ref 0.50–1.10)
GFR calc Af Amer: >90 mL/min (ref >90)
GFR calc non Af Amer: >90 mL/min (ref >90)
Glucose, Bld: 83 mg/dL (ref 70–99)
Potassium: 3.6 mEq/L (ref 3.5–5.1)
Sodium: 140 mEq/L (ref 135–145)
Total Bilirubin: 0.3 mg/dL (ref 0.3–1.2)
Total Protein: 7.5 g/dL (ref 6.0–8.3)

## 2012-11-11 LAB — URINALYSIS, ROUTINE W REFLEX MICROSCOPIC
Bilirubin Urine: NEGATIVE
Glucose, UA: NEGATIVE mg/dL
Hgb urine dipstick: NEGATIVE
Ketones, ur: NEGATIVE mg/dL
Leukocytes, UA: NEGATIVE
Nitrite: NEGATIVE
Protein, ur: NEGATIVE mg/dL
Specific Gravity, Urine: 1.012 (ref 1.005–1.030)
Urobilinogen, UA: 0.2 mg/dL (ref 0.0–1.0)
pH: 8 (ref 5.0–8.0)

## 2012-11-11 LAB — LIPASE, BLOOD: Lipase: 30 U/L (ref 11–59)

## 2012-11-11 LAB — CBC WITH DIFFERENTIAL/PLATELET
Basophils Absolute: 0 10*3/uL (ref 0.0–0.1)
Basophils Relative: 0 % (ref 0–1)
Eosinophils Absolute: 0.4 10*3/uL (ref 0.0–0.7)
Eosinophils Relative: 5 % (ref 0–5)
HCT: 34.4 % — ABNORMAL LOW (ref 36.0–46.0)
Hemoglobin: 11.7 g/dL — ABNORMAL LOW (ref 12.0–15.0)
Lymphocytes Relative: 25 % (ref 12–46)
Lymphs Abs: 2.3 10*3/uL (ref 0.7–4.0)
MCH: 31.1 pg (ref 26.0–34.0)
MCHC: 34 g/dL (ref 30.0–36.0)
MCV: 91.5 fL (ref 78.0–100.0)
Monocytes Absolute: 0.7 10*3/uL (ref 0.1–1.0)
Monocytes Relative: 8 % (ref 3–12)
Neutro Abs: 5.6 10*3/uL (ref 1.7–7.7)
Neutrophils Relative %: 62 % (ref 43–77)
Platelets: 342 10*3/uL (ref 150–400)
RBC: 3.76 MIL/uL — ABNORMAL LOW (ref 3.87–5.11)
RDW: 14.2 % (ref 11.5–15.5)
WBC: 9.1 10*3/uL (ref 4.0–10.5)

## 2012-11-11 MED ORDER — AZITHROMYCIN 250 MG PO TABS: ORAL_TABLET | ORAL | Status: DC

## 2012-11-11 MED ORDER — SODIUM CHLORIDE 0.9 % IV SOLN
INTRAVENOUS | Status: DC
  Administered 2012-11-11: 09:00:00 via INTRAVENOUS

## 2012-11-11 MED ORDER — GUAIFENESIN-CODEINE 100-10 MG/5ML PO SYRP: 10.0000 mL | ORAL_SOLUTION | ORAL | Status: DC | PRN

## 2012-11-11 MED ORDER — GUAIFENESIN-CODEINE 100-10 MG/5ML PO SOLN
10.0000 mL | Freq: Once | ORAL | Status: AC
  Administered 2012-11-11: 10 mL via ORAL
  Filled 2012-11-11: qty 10

## 2012-11-11 MED ORDER — ONDANSETRON HCL 4 MG/2ML IJ SOLN
4.0000 mg | Freq: Once | INTRAMUSCULAR | Status: AC
  Administered 2012-11-11: 4 mg via INTRAVENOUS
  Filled 2012-11-11: qty 2

## 2012-11-11 MED ORDER — HYDROMORPHONE HCL PF 1 MG/ML IJ SOLN
1.0000 mg | Freq: Once | INTRAMUSCULAR | Status: AC
  Administered 2012-11-11: 1 mg via INTRAVENOUS
  Filled 2012-11-11: qty 1

## 2012-11-11 NOTE — ED Provider Notes (Signed)
History     CSN: [id number]  Arrival date & time 11/11/12  0704   None   7:57 AM In bathroom at 7:57 A.M.   Chief Complaint  Patient presents with  . Emesis  . heart racing     (Consider location/radiation/quality/duration/timing/severity/associated sxs/prior treatment) HPI Comments: 51 year old woman complains that she has had a cough, runny nose, and subjective sensation of fever since Sunday, 3 days ago. Last night she developed cramping abdominal pain and vomiting. She had taken some NyQuil, and then developed palpitations. She therefore sought evaluation.  Patient is a 51 y.o. female presenting with abdominal pain. The history is provided by the patient. No language interpreter was used.  Abdominal Pain The primary symptoms of the illness include abdominal pain, nausea and vomiting. The primary symptoms of the illness do not include diarrhea. The current episode started 13 to 24 hours ago. The onset of the illness was gradual. The problem has been rapidly worsening.  The abdominal pain began 13 to24 hours ago. The pain came on gradually. The abdominal pain has been gradually worsening since its onset. The abdominal pain is located in the periumbilical region. The abdominal pain does not radiate. The severity of the abdominal pain is 6/10. The abdominal pain is relieved by nothing. Exacerbated by: Nothing.  Associated with: Runny nose, sore throat, cough, for 3 days. The patient states that she believes she is currently not pregnant. The patient has not had a change in bowel habit. Risk factors for an acute abdominal problem include a history of abdominal surgery (Prior hysterectomy.). Additional symptoms associated with the illness include chills.    Past Medical History  Diagnosis Date  . Asthma   . Palpitations     Past Surgical History  Procedure Date  . Abdominal hysterectomy   . Knee surgery     No family history on file.  History  Substance Use Topics  . Smoking  status: Never Smoker   . Smokeless tobacco: Not on file  . Alcohol Use: No    OB History    Grav Para Term Preterm Abortions TAB SAB Ect Mult Living                  Review of Systems  Constitutional: Positive for chills.       Subjective sensation of fever.  HENT: Positive for sore throat and rhinorrhea.   Respiratory: Positive for cough.   Cardiovascular: Positive for palpitations.  Gastrointestinal: Positive for nausea, vomiting and abdominal pain. Negative for diarrhea.  Genitourinary: Negative.   Musculoskeletal: Negative.   Skin: Negative.   Neurological: Negative.   Psychiatric/Behavioral: Negative.     Allergies  Eggs or egg-derived products  Home Medications   Current Outpatient Rx  Name  Route  Sig  Dispense  Refill  . ESTRADIOL 0.05 MG/24HR TD PTTW   Transdermal   Place 1 patch onto the skin 2 (two) times a week.         . MULTI-VITAMIN/MINERALS PO TABS   Oral   Take 1 tablet by mouth daily.         . SULFAMETHOXAZOLE-TRIMETHOPRIM 800-160 MG PO TABS   Oral   Take 1 tablet by mouth 2 (two) times daily.   14 tablet   0     There were no vitals taken for this visit.  Physical Exam  Nursing note and vitals reviewed. Constitutional: She is oriented to person, place, and time. She appears well-developed and well-nourished.  In moderate distress with abdominal pain.  Has hacking cough.  HENT:  Head: Normocephalic and atraumatic.  Right Ear: External ear normal.  Left Ear: External ear normal.  Mouth/Throat: Oropharynx is clear and moist.  Eyes: Conjunctivae normal and EOM are normal. Pupils are equal, round, and reactive to light.  Neck: Normal range of motion. Neck supple.  Cardiovascular: Normal rate, regular rhythm and normal heart sounds.   Pulmonary/Chest: Effort normal and breath sounds normal.  Abdominal: Soft. Bowel sounds are normal.       Abdomen is soft, no mass or tenderness.  Bowel sounds are normal.    Musculoskeletal: Normal  range of motion.  Neurological: She is alert and oriented to person, place, and time.       No sensory or motor deficit.  Skin: Skin is warm and dry.  Psychiatric: She has a normal mood and affect. Her behavior is normal.    ED Course  Procedures (including critical care time)  7:14 AM  Date: 11/11/2012  Rate: 91  Rhythm: normal sinus rhythm  QRS Axis: normal  Intervals: normal  ST/T Wave abnormalities: normal  Conduction Disutrbances:right bundle branch block  Narrative Interpretation: Abnormal EKG  Old EKG Reviewed: none available  8:24 AM Pt seen --> physical exam performed.  Lab workup ordered.  IV Dilaudid and Zofran ordered for abdominal pain.  Robitussin AC for cough.  11:16 AM Results for orders placed during the hospital encounter of 11/11/12  CBC WITH DIFFERENTIAL      Component Value Range   WBC 9.1  4.0 - 10.5 K/uL   RBC 3.76 (*) 3.87 - 5.11 MIL/uL   Hemoglobin 11.7 (*) 12.0 - 15.0 g/dL   HCT 16.1 (*) 09.6 - 04.5 %   MCV 91.5  78.0 - 100.0 fL   MCH 31.1  26.0 - 34.0 pg   MCHC 34.0  30.0 - 36.0 g/dL   RDW 40.9  81.1 - 91.4 %   Platelets 342  150 - 400 K/uL   Neutrophils Relative 62  43 - 77 %   Neutro Abs 5.6  1.7 - 7.7 K/uL   Lymphocytes Relative 25  12 - 46 %   Lymphs Abs 2.3  0.7 - 4.0 K/uL   Monocytes Relative 8  3 - 12 %   Monocytes Absolute 0.7  0.1 - 1.0 K/uL   Eosinophils Relative 5  0 - 5 %   Eosinophils Absolute 0.4  0.0 - 0.7 K/uL   Basophils Relative 0  0 - 1 %   Basophils Absolute 0.0  0.0 - 0.1 K/uL  COMPREHENSIVE METABOLIC PANEL      Component Value Range   Sodium 140  135 - 145 mEq/L   Potassium 3.6  3.5 - 5.1 mEq/L   Chloride 106  96 - 112 mEq/L   CO2 22  19 - 32 mEq/L   Glucose, Bld 83  70 - 99 mg/dL   BUN 11  6 - 23 mg/dL   Creatinine, Ser 7.82  0.50 - 1.10 mg/dL   Calcium 9.4  8.4 - 95.6 mg/dL   Total Protein 7.5  6.0 - 8.3 g/dL   Albumin 3.8  3.5 - 5.2 g/dL   AST 19  0 - 37 U/L   ALT 13  0 - 35 U/L   Alkaline Phosphatase 76   39 - 117 U/L   Total Bilirubin 0.3  0.3 - 1.2 mg/dL   GFR calc non Af Amer >90  >90 mL/min  GFR calc Af Amer >90  >90 mL/min  LIPASE, BLOOD      Component Value Range   Lipase 30  11 - 59 U/L  URINALYSIS, ROUTINE W REFLEX MICROSCOPIC      Component Value Range   Color, Urine YELLOW  YELLOW   APPearance CLEAR  CLEAR   Specific Gravity, Urine 1.012  1.005 - 1.030   pH 8.0  5.0 - 8.0   Glucose, UA NEGATIVE  NEGATIVE mg/dL   Hgb urine dipstick NEGATIVE  NEGATIVE   Bilirubin Urine NEGATIVE  NEGATIVE   Ketones, ur NEGATIVE  NEGATIVE mg/dL   Protein, ur NEGATIVE  NEGATIVE mg/dL   Urobilinogen, UA 0.2  0.0 - 1.0 mg/dL   Nitrite NEGATIVE  NEGATIVE   Leukocytes, UA NEGATIVE  NEGATIVE   Dg Abd Acute W/chest  11/11/2012  *RADIOLOGY REPORT*  Clinical Data: Cough with fever.  Nausea and vomiting.  Mid abdominal cramping.  ACUTE ABDOMEN SERIES (ABDOMEN 2 VIEW & CHEST 1 VIEW)  Comparison: Chest x-ray from 10/09/2010  Findings: The lungs are clear without focal consolidation, edema, effusion or pneumothorax.  Cardiopericardial silhouette is within normal limits for size.  Imaged bony structures of the thorax are intact.  Upright film shows no evidence for intraperitoneal free air. Supine film shows no gaseous bowel dilatation to suggest obstruction.  No unexpected abdominopelvic calcification.  Several phleboliths are seen overlying the anatomic pelvis.  IMPRESSION: Normal chest x-ray.  No evidence for intraperitoneal free air or bowel obstruction.   Original Report Authenticated By: Kennith Center, M.D.     Lab tests were reassuringly normal.  Pt feeling better, cough better.  Rx for bronchitis with Z-pak, Robitussin AC.  1. Acute bronchitis         Carleene Cooper III, MD 11/11/12 1119

## 2012-11-11 NOTE — ED Notes (Signed)
Patient transported to X-ray 

## 2012-11-11 NOTE — ED Notes (Signed)
Pt is here with abdominal pain that started last nite.  Reported cold symptoms.  Started vomiting this am.  Pt reports heart is racing.

## 2012-11-11 NOTE — Discharge Instructions (Signed)

## 2013-02-02 ENCOUNTER — Other Ambulatory Visit: Payer: Self-pay

## 2013-02-02 DIAGNOSIS — Z1231 Encounter for screening mammogram for malignant neoplasm of breast: Secondary | ICD-10-CM

## 2013-03-15 ENCOUNTER — Ambulatory Visit
Admission: RE | Admit: 2013-03-15 | Discharge: 2013-03-15 | Disposition: A | Discharge status: Home / Self Care | Payer: BC Managed Care – PPO | Source: Ambulatory Visit

## 2013-03-15 DIAGNOSIS — Z1231 Encounter for screening mammogram for malignant neoplasm of breast: Secondary | ICD-10-CM

## 2013-04-01 ENCOUNTER — Other Ambulatory Visit: Payer: Self-pay | Admitting: Gastroenterology

## 2013-05-01 ENCOUNTER — Other Ambulatory Visit: Payer: Self-pay | Admitting: Obstetrics and Gynecology

## 2013-07-15 ENCOUNTER — Emergency Department (HOSPITAL_COMMUNITY): Payer: BC Managed Care – PPO

## 2013-07-15 ENCOUNTER — Emergency Department (HOSPITAL_COMMUNITY)
Admission: EM | Admit: 2013-07-15 | Discharge: 2013-07-15 | Disposition: A | Discharge status: Nursing Home (Includes ICF) | Payer: BC Managed Care – PPO | Source: Home / Self Care | Attending: Emergency Medicine | Admitting: Emergency Medicine

## 2013-07-15 ENCOUNTER — Encounter (HOSPITAL_COMMUNITY): Payer: Self-pay

## 2013-07-15 ENCOUNTER — Observation Stay (HOSPITAL_COMMUNITY): Payer: BC Managed Care – PPO

## 2013-07-15 ENCOUNTER — Observation Stay (HOSPITAL_COMMUNITY)
Admission: EM | Admit: 2013-07-15 | Discharge: 2013-07-16 | Disposition: A | Discharge status: Home / Self Care | Payer: BC Managed Care – PPO | Attending: Internal Medicine | Admitting: Internal Medicine

## 2013-07-15 ENCOUNTER — Encounter (HOSPITAL_COMMUNITY): Payer: Self-pay | Admitting: Emergency Medicine

## 2013-07-15 DIAGNOSIS — Z79899 Other long term (current) drug therapy: Secondary | ICD-10-CM | POA: Insufficient documentation

## 2013-07-15 DIAGNOSIS — R0609 Other forms of dyspnea: Secondary | ICD-10-CM | POA: Diagnosis present

## 2013-07-15 DIAGNOSIS — I4510 Unspecified right bundle-branch block: Secondary | ICD-10-CM | POA: Diagnosis present

## 2013-07-15 DIAGNOSIS — R079 Chest pain, unspecified: Principal | ICD-10-CM | POA: Diagnosis present

## 2013-07-15 DIAGNOSIS — IMO0001 Reserved for inherently not codable concepts without codable children: Secondary | ICD-10-CM

## 2013-07-15 DIAGNOSIS — I200 Unstable angina: Secondary | ICD-10-CM

## 2013-07-15 DIAGNOSIS — R0989 Other specified symptoms and signs involving the circulatory and respiratory systems: Secondary | ICD-10-CM | POA: Insufficient documentation

## 2013-07-15 DIAGNOSIS — R0600 Dyspnea, unspecified: Secondary | ICD-10-CM | POA: Insufficient documentation

## 2013-07-15 DIAGNOSIS — D649 Anemia, unspecified: Secondary | ICD-10-CM | POA: Diagnosis present

## 2013-07-15 DIAGNOSIS — J45909 Unspecified asthma, uncomplicated: Secondary | ICD-10-CM | POA: Diagnosis present

## 2013-07-15 HISTORY — DX: Shortness of breath: R06.02

## 2013-07-15 HISTORY — DX: Unspecified osteoarthritis, unspecified site: M19.90

## 2013-07-15 LAB — CBC
HCT: 32.1 % — ABNORMAL LOW (ref 36.0–46.0)
HCT: 30.4 % — ABNORMAL LOW (ref 36.0–46.0)
Hemoglobin: 11.1 g/dL — ABNORMAL LOW (ref 12.0–15.0)
Hemoglobin: 10.4 g/dL — ABNORMAL LOW (ref 12.0–15.0)
MCH: 30.9 pg (ref 26.0–34.0)
MCH: 30.6 pg (ref 26.0–34.0)
MCHC: 34.6 g/dL (ref 30.0–36.0)
MCHC: 34.2 g/dL (ref 30.0–36.0)
MCV: 89.4 fL (ref 78.0–100.0)
MCV: 89.4 fL (ref 78.0–100.0)
Platelets: 323 10*3/uL (ref 150–400)
Platelets: 316 10*3/uL (ref 150–400)
RBC: 3.59 MIL/uL — ABNORMAL LOW (ref 3.87–5.11)
RBC: 3.4 MIL/uL — ABNORMAL LOW (ref 3.87–5.11)
RDW: 14.1 % (ref 11.5–15.5)
RDW: 14.1 % (ref 11.5–15.5)
WBC: 8.1 10*3/uL (ref 4.0–10.5)
WBC: 9.4 10*3/uL (ref 4.0–10.5)

## 2013-07-15 LAB — D-DIMER, QUANTITATIVE: D-Dimer, Quant: <0.27 ug/mL-FEU (ref 0.00–0.48)

## 2013-07-15 LAB — T4, FREE: Free T4: 1.02 ng/dL (ref 0.80–1.80)

## 2013-07-15 LAB — BASIC METABOLIC PANEL
BUN: 12 mg/dL (ref 6–23)
CO2: 24 mEq/L (ref 19–32)
Calcium: 9.5 mg/dL (ref 8.4–10.5)
Chloride: 105 mEq/L (ref 96–112)
Creatinine, Ser: 0.9 mg/dL (ref 0.50–1.10)
GFR calc Af Amer: 85 mL/min — ABNORMAL LOW (ref >90)
GFR calc non Af Amer: 73 mL/min — ABNORMAL LOW (ref >90)
Glucose, Bld: 87 mg/dL (ref 70–99)
Potassium: 3.7 mEq/L (ref 3.5–5.1)
Sodium: 139 mEq/L (ref 135–145)

## 2013-07-15 LAB — TSH: TSH: 1.646 u[IU]/mL (ref 0.350–4.500)

## 2013-07-15 LAB — TROPONIN I
Troponin I: <0.3 ng/mL (ref <0.30)
Troponin I: <0.3 ng/mL (ref <0.30)

## 2013-07-15 LAB — CREATININE, SERUM
Creatinine, Ser: 0.84 mg/dL (ref 0.50–1.10)
GFR calc Af Amer: >90 mL/min (ref >90)
GFR calc non Af Amer: 80 mL/min — ABNORMAL LOW (ref >90)

## 2013-07-15 LAB — POCT I-STAT TROPONIN I: Troponin i, poc: 0 ng/mL (ref 0.00–0.08)

## 2013-07-15 LAB — MRSA PCR SCREENING: MRSA by PCR: NEGATIVE

## 2013-07-15 MED ORDER — ESTRADIOL 0.05 MG/24HR TD PTWK
0.0500 mg | MEDICATED_PATCH | TRANSDERMAL | Status: DC
  Filled 2013-07-15: qty 1

## 2013-07-15 MED ORDER — SODIUM CHLORIDE 0.9 % IV SOLN: 250.0000 mL | INTRAVENOUS | Status: DC | PRN

## 2013-07-15 MED ORDER — SODIUM CHLORIDE 0.9 % IJ SOLN: 3.0000 mL | Freq: Two times a day (BID) | INTRAMUSCULAR | Status: DC

## 2013-07-15 MED ORDER — ONDANSETRON HCL 4 MG PO TABS: 4.0000 mg | ORAL_TABLET | Freq: Four times a day (QID) | ORAL | Status: DC | PRN

## 2013-07-15 MED ORDER — ADULT MULTIVITAMIN W/MINERALS CH
1.0000 | ORAL_TABLET | Freq: Every day | ORAL | Status: DC
  Administered 2013-07-15 – 2013-07-16 (×2): 1 via ORAL
  Filled 2013-07-15 (×2): qty 1

## 2013-07-15 MED ORDER — GI COCKTAIL ~~LOC~~: 30.0000 mL | Freq: Two times a day (BID) | ORAL | Status: DC | PRN

## 2013-07-15 MED ORDER — ONDANSETRON HCL 4 MG/2ML IJ SOLN: 4.0000 mg | Freq: Four times a day (QID) | INTRAMUSCULAR | Status: DC | PRN

## 2013-07-15 MED ORDER — NITROGLYCERIN 0.4 MG SL SUBL
SUBLINGUAL_TABLET | SUBLINGUAL | Status: AC
  Filled 2013-07-15: qty 25

## 2013-07-15 MED ORDER — ASPIRIN 81 MG PO CHEW
CHEWABLE_TABLET | ORAL | Status: AC
  Filled 2013-07-15: qty 1

## 2013-07-15 MED ORDER — NITROGLYCERIN 0.4 MG SL SUBL
0.4000 mg | SUBLINGUAL_TABLET | SUBLINGUAL | Status: DC | PRN
  Administered 2013-07-15: 0.4 mg via SUBLINGUAL

## 2013-07-15 MED ORDER — ALBUTEROL SULFATE HFA 108 (90 BASE) MCG/ACT IN AERS
2.0000 | INHALATION_SPRAY | RESPIRATORY_TRACT | Status: DC | PRN
  Filled 2013-07-15: qty 6.7

## 2013-07-15 MED ORDER — NITROGLYCERIN 0.4 MG SL SUBL
0.4000 mg | SUBLINGUAL_TABLET | SUBLINGUAL | Status: AC | PRN
  Administered 2013-07-15: 0.4 mg via SUBLINGUAL

## 2013-07-15 MED ORDER — SENNOSIDES-DOCUSATE SODIUM 8.6-50 MG PO TABS
1.0000 | ORAL_TABLET | Freq: Every evening | ORAL | Status: DC | PRN
  Filled 2013-07-15: qty 1

## 2013-07-15 MED ORDER — HYDROCODONE-ACETAMINOPHEN 5-325 MG PO TABS
1.0000 | ORAL_TABLET | ORAL | Status: DC | PRN
  Administered 2013-07-15 (×2): 1 via ORAL
  Filled 2013-07-15: qty 1
  Filled 2013-07-15: qty 2

## 2013-07-15 MED ORDER — HYDROCOD POLST-CHLORPHEN POLST 10-8 MG/5ML PO LQCR
5.0000 mL | Freq: Once | ORAL | Status: AC
Start: 2013-07-15 — End: 2013-07-15
  Administered 2013-07-15: 5 mL via ORAL
  Filled 2013-07-15: qty 5

## 2013-07-15 MED ORDER — MORPHINE SULFATE 2 MG/ML IJ SOLN
2.0000 mg | INTRAMUSCULAR | Status: DC | PRN
  Administered 2013-07-15 – 2013-07-16 (×2): 2 mg via INTRAVENOUS
  Filled 2013-07-15 (×2): qty 1

## 2013-07-15 MED ORDER — GUAIFENESIN ER 600 MG PO TB12
1200.0000 mg | ORAL_TABLET | Freq: Two times a day (BID) | ORAL | Status: DC | PRN
  Filled 2013-07-15: qty 2

## 2013-07-15 MED ORDER — NITROGLYCERIN 0.4 MG SL SUBL: 0.4000 mg | SUBLINGUAL_TABLET | SUBLINGUAL | Status: DC | PRN

## 2013-07-15 MED ORDER — ENOXAPARIN SODIUM 40 MG/0.4ML ~~LOC~~ SOLN
40.0000 mg | SUBCUTANEOUS | Status: DC
  Administered 2013-07-15: 40 mg via SUBCUTANEOUS
  Filled 2013-07-15 (×2): qty 0.4

## 2013-07-15 MED ORDER — ASPIRIN 81 MG PO CHEW
324.0000 mg | CHEWABLE_TABLET | Freq: Once | ORAL | Status: AC
  Administered 2013-07-15: 324 mg via ORAL

## 2013-07-15 MED ORDER — ALUM & MAG HYDROXIDE-SIMETH 200-200-20 MG/5ML PO SUSP: 30.0000 mL | Freq: Four times a day (QID) | ORAL | Status: DC | PRN

## 2013-07-15 MED ORDER — IOHEXOL 350 MG/ML SOLN
100.0000 mL | Freq: Once | INTRAVENOUS | Status: AC | PRN
  Administered 2013-07-15: 100 mL via INTRAVENOUS

## 2013-07-15 MED ORDER — ALBUTEROL SULFATE (5 MG/ML) 0.5% IN NEBU
2.5000 mg | INHALATION_SOLUTION | Freq: Once | RESPIRATORY_TRACT | Status: AC
  Administered 2013-07-15: 2.5 mg via RESPIRATORY_TRACT
  Filled 2013-07-15: qty 0.5

## 2013-07-15 MED ORDER — ASPIRIN 81 MG PO CHEW
CHEWABLE_TABLET | ORAL | Status: AC
  Filled 2013-07-15: qty 4

## 2013-07-15 MED ORDER — SODIUM CHLORIDE 0.9 % IV SOLN
INTRAVENOUS | Status: DC
  Administered 2013-07-15: 11:00:00 via INTRAVENOUS

## 2013-07-15 MED ORDER — PANTOPRAZOLE SODIUM 40 MG PO TBEC
40.0000 mg | DELAYED_RELEASE_TABLET | Freq: Every day | ORAL | Status: DC
  Administered 2013-07-15 – 2013-07-16 (×2): 40 mg via ORAL
  Filled 2013-07-15 (×2): qty 1

## 2013-07-15 MED ORDER — ACETAMINOPHEN 650 MG RE SUPP: 650.0000 mg | Freq: Four times a day (QID) | RECTAL | Status: DC | PRN

## 2013-07-15 MED ORDER — ASPIRIN EC 81 MG PO TBEC
81.0000 mg | DELAYED_RELEASE_TABLET | Freq: Every day | ORAL | Status: DC
  Administered 2013-07-16: 81 mg via ORAL
  Filled 2013-07-15 (×2): qty 1

## 2013-07-15 MED ORDER — SODIUM CHLORIDE 0.9 % IJ SOLN
3.0000 mL | Freq: Two times a day (BID) | INTRAMUSCULAR | Status: DC
  Administered 2013-07-15 – 2013-07-16 (×2): 3 mL via INTRAVENOUS

## 2013-07-15 MED ORDER — SODIUM CHLORIDE 0.9 % IJ SOLN: 3.0000 mL | INTRAMUSCULAR | Status: DC | PRN

## 2013-07-15 MED ORDER — ACETAMINOPHEN 325 MG PO TABS: 650.0000 mg | ORAL_TABLET | Freq: Four times a day (QID) | ORAL | Status: DC | PRN

## 2013-07-15 NOTE — H&P (Addendum)
[facility] History and Physical  Angela Mcconnell ZOX:[id number] DOB: [birth year] DOA: 07/15/2013  Referring physician: EDP PCP: No PCP Per Patient  Specialists: [facility]  Chief Complaint: chest pain and shortness of breath  HPI: Angela Mcconnell is a 52 y.o. female with a history of asthma and right bundle branch block, who had a clean cardiac cath in 10/2009 by [facility].  She presented to [facility] this am with complaints of chest pain and shortness of breath.  Per Angela Mcconnell, Sunday and Monday she felt very fatigued and developed a dry cough which she attributed to seasonal allergies.  On Tuesday night she was awakened from sleep with sudden shortness of breath and mid sternal chest pain.  She had to sit up for the rest of the night.  She normally exercises (water aerobics) and takes the stairs every day at work.  She noticed decreased exercise tolerance this week in that she could not climb the stairs.  Wednesday night she experienced PND again and noticed chest pain radiating to her right jaw and right arm.  She went to urgent care this morning and was sent to the emergency department for further work up.  She does not drink alcohol or smoke tobacco.  She does not have DM, HTN, HLD.  She is not obese.  She has had no recent travel or surgery, but she is sedentary at work and has an estrogen patch.  POC troponin is negative, EKG shows pre-existing RBBB, D-Dimer and CT Angio Chest are ordered.  Review of Systems: The patient denies anorexia, fever, weight loss,, vision loss, decreased hearing, hoarseness, abdominal pain, melena, hematochezia, severe indigestion/heartburn, hematuria, incontinence, genital sores, muscle weakness, suspicious skin lesions, transient blindness, difficulty walking, depression, unusual weight change, abnormal bleeding.  She endorses some lower extremity swelling bilaterally and fatigue.   Past Medical History  Diagnosis Date  . Asthma   . Palpitations    Past  Surgical History  Procedure Laterality Date  . Abdominal hysterectomy    . Knee surgery     Social History:  reports that she has never smoked. She does not have any smokeless tobacco history on file. She reports that she does not drink alcohol or use illicit drugs.  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   Allergies  Allergen Reactions  . Eggs Or Egg-Derived Products     Hives, mouth and throat itching    History reviewed. No pertinent family history.  Mother with   Prior to Admission medications   Medication Sig Start Date End Date Taking? Authorizing Provider  BIOTIN PO Take 1 tablet by mouth daily.   Yes Historical Provider, MD  Ephedrine-Guaifenesin (BRONKAID PO) Take 1 tablet by mouth daily as needed (for difficulty breathing).    Yes Historical Provider, MD  estradiol (VIVELLE-DOT) 0.05 MG/24HR Place 1 patch onto the skin 2 (two) times a week. On Saturday and Wednesday   Yes Historical Provider, MD  guaiFENesin (MUCINEX) 600 MG 12 hr tablet Take 1,200 mg by mouth 2 (two) times daily as needed for congestion.   Yes Historical Provider, MD  Multiple Vitamins-Minerals (MULTIVITAMIN WITH MINERALS) tablet Take 1 tablet by mouth daily.   Yes Historical Provider, MD   Physical Exam: Filed Vitals:   07/15/13 1300  BP: 133/84  Pulse: 78  Temp:   Resp: 11     General:  Wd, wn, aa female, slightly tremulous  Eyes: PEERLA  ENT: oropharynx with out exudates or erythema  Neck:  supple, no jvd, no lymphadenopathy  Cardiovascular: rrr, no m/r/g, no LE edema. Chest non tender to palpation.    Respiratory: CTA  Abdomen: soft, nt, nd, +BS, no masses  Skin: no rashes, bruises, or lesions  Musculoskeletal: 5/5  Psychiatric: A&O, well groomed, cooperative  Neurologic: cn 2-12 grossly in tact, non focal  Labs on Admission:  Basic Metabolic Panel:  Recent Labs Lab 07/15/13 1136  NA 139  K 3.7  CL 105  CO2 24  GLUCOSE 87  BUN 12  CREATININE 0.90   CALCIUM 9.5   CBC:  Recent Labs Lab 07/15/13 1136  WBC 8.1  HGB 11.1*  HCT 32.1*  MCV 89.4  PLT 323    Radiological Exams on Admission: Dg Chest Port 1 View  07/15/2013   *RADIOLOGY REPORT*  Clinical Data: Mid chest pain with difficulty breathing.  Coughing.  PORTABLE CHEST - 1 VIEW  Comparison: 11/11/2012.  Findings: Trachea is midline.  Heart size normal.  Lungs are clear. No pleural fluid.  IMPRESSION: No acute findings.   Original Report Authenticated By: Leanna Battles, M.D.    EKG: Independently reviewed. RBBB  Assessment/Plan Active Problems:   Chest pain at rest   PND (paroxysmal nocturnal dyspnea)   Asthma  Chest pain with shortness of breath Will rule out ACS and PE.  Does not appear to be GI related or musculo-skeletal  Admit to cardiac tele / observation T1 q 6 hours x 3, TSH 2D Echo CT Angio Chest SL Nitro PRN, Protonix, Maalox / GI Cocktail.  Asthma Stable  CXR Clear, exam clear PRN albuterol inhaler.  Code Status: full Family Communication: daughter at bedside Disposition Plan:  Observation   Time spent: 60 min.  Conley Canal [facility] Pager [phone]  If 7PM-7AM, please contact night-coverage [email or web address] Password [facility] 07/15/2013, 1:59 PM   Patient seen and examined. Agree with above note, assessment and plan by Remigio Eisenmenger, PA. Patient here with CP and SOB. Doubt cardiac in etiology given her clean cath in 2010. Suspicious for PE given her sedentary lifestyle and the way SOB plays a prominent role. She is also on HRT with estrogen. Will check CT Angio. If positive will need to discuss anticoagulation and cessation of HRT. Will also check TSH to r/o hypo/hyperthyroidism. Can probably go home in am if rules out and CT Angio is negative.  Peggye Pitt, MD [facility] Pager: [phone]

## 2013-07-15 NOTE — ED Notes (Signed)
Notified dr Lorenz Coaster of patient complaints

## 2013-07-15 NOTE — ED Notes (Signed)
Patient reports chest pressure, dry cough.  Patient reports dry cough started Tuesday, denies cold symptoms.  Reports pressure in chest.  "someone standing on chest" .  Pressure in chest, right arm tightness.  Cough worse with lying down, climbing stairs, and with water aerobics she felt like water was heavy on chest.  Patient took "bronkaid" and reports this made her heart speed up last night.  Patient reports similar episode approx 2 years ago and had cardiac work up, but "everything fine".

## 2013-07-15 NOTE — ED Notes (Signed)
Phlebotomy back at the bedside. Physician at the bedside.

## 2013-07-15 NOTE — ED Provider Notes (Signed)
CSN: [id number]     Arrival date & time 07/15/13  1048 History     First MD Initiated Contact with Patient 07/15/13 1144     Chief Complaint  Patient presents with  . Chest Pain   (Consider location/radiation/quality/duration/timing/severity/associated sxs/prior Treatment) HPI Comments: 52 year old African American female presents emergency polyp chief complaint of chest pain. Patient was seen in urgent care today and transferred to the emergency department for further evaluation and management. Patient received full dose aspirin at the urgent care clinic and was given nitroglycerin and her pain improved.  Patient is a 52 y.o. female presenting with chest pain. The history is provided by the patient.  Chest Pain Pain location:  R chest Pain quality: aching and radiating   Pain quality: not burning, not crushing, not dull, not hot, no pressure, not sharp and not shooting   Pain radiates to:  R arm Pain radiates to the back: no   Pain severity:  Mild Onset quality:  Gradual Timing:  Intermittent Progression:  Waxing and waning Chronicity:  New Context: breathing   Context: no drug use, not eating, no intercourse, not lifting, no movement, not raising an arm, not at rest, no stress and no trauma   Relieved by:  None tried Worsened by:  Nothing tried Ineffective treatments:  None tried Associated symptoms: shortness of breath   Associated symptoms: no abdominal pain, no altered mental status, no anxiety, no back pain, no diaphoresis, no fatigue and no palpitations   Associated symptoms comment:  Patient has a history of asthma and thought that her current symptoms were related to her asthma. She does not take any outpatient medications for her asthma the   Past Medical History  Diagnosis Date  . Asthma   . Palpitations    Past Surgical History  Procedure Laterality Date  . Abdominal hysterectomy    . Knee surgery     History reviewed. No pertinent family history. History   Substance Use Topics  . Smoking status: Never Smoker   . Smokeless tobacco: Not on file  . Alcohol Use: No   OB History   Grav Para Term Preterm Abortions TAB SAB Ect Mult Living                 Review of Systems  Constitutional: Positive for activity change. Negative for chills, diaphoresis, appetite change and fatigue.  HENT: Positive for neck pain. Negative for hearing loss, ear pain, facial swelling and ear discharge.   Eyes: Negative.  Negative for photophobia, pain, redness and visual disturbance.  Respiratory: Positive for chest tightness and shortness of breath. Negative for apnea.        Patient reports a subjective shortness of breath that she attributed to her asthma  Cardiovascular: Positive for chest pain. Negative for palpitations and leg swelling.  Gastrointestinal: Positive for blood in stool. Negative for abdominal pain, constipation and anal bleeding.  Endocrine: Negative.   Genitourinary: Negative.   Musculoskeletal: Negative for back pain.  Neurological: Negative.     Allergies  Eggs or egg-derived products  Home Medications   Current Outpatient Rx  Name  Route  Sig  Dispense  Refill  . BIOTIN PO   Oral   Take 1 tablet by mouth daily.         Marland Kitchen Ephedrine-Guaifenesin (BRONKAID PO)   Oral   Take 1 tablet by mouth daily as needed (for difficulty breathing).          Marland Kitchen estradiol (VIVELLE-DOT) 0.05 MG/24HR  Transdermal   Place 1 patch onto the skin 2 (two) times a week. On Saturday and Wednesday         . guaiFENesin (MUCINEX) 600 MG 12 hr tablet   Oral   Take 1,200 mg by mouth 2 (two) times daily as needed for congestion.         . Multiple Vitamins-Minerals (MULTIVITAMIN WITH MINERALS) tablet   Oral   Take 1 tablet by mouth daily.          BP 134/73  Pulse 69  Temp(Src) 97.9 F (36.6 C) (Oral)  Resp 11  Ht 5\' 3"  (1.6 m)  Wt 179 lb (81.194 kg)  BMI 31.72 kg/m2  SpO2 100% Physical Exam  Constitutional: She is oriented to  person, place, and time. She appears well-developed and well-nourished.  HENT:  Head: Normocephalic and atraumatic.  Eyes: Conjunctivae and EOM are normal. Pupils are equal, round, and reactive to light.  Neck: Normal range of motion. Neck supple.  Cardiovascular: Normal rate, regular rhythm and normal heart sounds.   Pulmonary/Chest: Effort normal and breath sounds normal.  Abdominal: Soft. Bowel sounds are normal. She exhibits no distension. There is no tenderness. There is no rebound and no guarding.  Musculoskeletal: Normal range of motion. She exhibits no edema and no tenderness.  Neurological: She is alert and oriented to person, place, and time. She has normal reflexes.    ED Course   Procedures (including critical care time)  Labs Reviewed  CBC - Abnormal; Notable for the following:    RBC 3.59 (*)    Hemoglobin 11.1 (*)    HCT 32.1 (*)    All other components within normal limits  BASIC METABOLIC PANEL - Abnormal; Notable for the following:    GFR calc non Af Amer 73 (*)    GFR calc Af Amer 85 (*)    All other components within normal limits  TROPONIN I  TROPONIN I  TROPONIN I  POCT I-STAT TROPONIN I   Dg Chest Port 1 View  07/15/2013   *RADIOLOGY REPORT*  Clinical Data: Mid chest pain with difficulty breathing.  Coughing.  PORTABLE CHEST - 1 VIEW  Comparison: 11/11/2012.  Findings: Trachea is midline.  Heart size normal.  Lungs are clear. No pleural fluid.  IMPRESSION: No acute findings.   Original Report Authenticated By: Leanna Battles, M.D.    Results for orders placed during the hospital encounter of 07/15/13  CBC      Result Value Range   WBC 8.1  4.0 - 10.5 K/uL   RBC 3.59 (*) 3.87 - 5.11 MIL/uL   Hemoglobin 11.1 (*) 12.0 - 15.0 g/dL   HCT 16.1 (*) 09.6 - 04.5 %   MCV 89.4  78.0 - 100.0 fL   MCH 30.9  26.0 - 34.0 pg   MCHC 34.6  30.0 - 36.0 g/dL   RDW 40.9  81.1 - 91.4 %   Platelets 323  150 - 400 K/uL  BASIC METABOLIC PANEL      Result Value Range    Sodium 139  135 - 145 mEq/L   Potassium 3.7  3.5 - 5.1 mEq/L   Chloride 105  96 - 112 mEq/L   CO2 24  19 - 32 mEq/L   Glucose, Bld 87  70 - 99 mg/dL   BUN 12  6 - 23 mg/dL   Creatinine, Ser 7.82  0.50 - 1.10 mg/dL   Calcium 9.5  8.4 - 95.6 mg/dL   GFR calc non  Af Amer 73 (*) >90 mL/min   GFR calc Af Amer 85 (*) >90 mL/min  POCT I-STAT TROPONIN I      Result Value Range   Troponin i, poc 0.00  0.00 - 0.08 ng/mL   Comment 3              Date: 07/15/2013  Rate: 71  Rhythm: normal sinus rhythm  QRS Axis: normal  Intervals: normal  ST/T Wave abnormalities: nonspecific ST changes  Conduction Disutrbances:right bundle branch block  Narrative Interpretation:   Old EKG Reviewed: unchanged  12:56 PM VSS, Pt in nad, plan for admit for r/o acs.  Discussed with medicine.  Pt is aware.    No diagnosis found.  MDM  52 year old female presents emergency department with chest pain which radiated to right arm and right jaw. Symptoms were improved with nitroglycerin and aspirin. Patient was transferred from urgent care clinic for further workup and management.12:55 PM vital signs are stable patient is pain-free. First EKG and troponin were negative. Place on patient's risk factors and symptoms improve nitroglycerin we'll plan for consult for admission.  Patient also has history of asthma however she is on no outpatient therapy.  We'll give albuterol times one.  Consult Internal Medicine for admission.     Darlys Gales, MD 07/15/13 1256

## 2013-07-15 NOTE — ED Notes (Signed)
Pt from [facility] for chest pain for past three days. States it woke her up out of her sleep three days ago. Was having SOB with exertion. States the pain is in mid chest and under right breast and feels like a pressure feeling. Given ASA 324 mg at [facility] gave 1 NTG and pain went from 5/10 to 3/10. Placed on O2 at 2 liters and 20g to LAC.

## 2013-07-15 NOTE — ED Notes (Signed)
[facility] notified 

## 2013-07-15 NOTE — ED Notes (Signed)
Pt returned from [facility]. Pain in chest still 3/10. Denies any SOB. States will try another nitroglycerin tablet.

## 2013-07-15 NOTE — ED Notes (Signed)
Attempted report 

## 2013-07-15 NOTE — ED Provider Notes (Signed)
Chief Complaint:   Chief Complaint  Patient presents with  . Chest Pain    History of Present Illness:   Angela Mcconnell is a 52 year old female who has had a three-day history of chest discomfort. She describes a pressure or tightness in the substernal area which radiates to the right arm and into both shoulders and the neck. It sometimes painful. She finds a hard to breathe and notes dyspnea on exertion particularly when going up a flight of steps. She's had a dry cough but no wheezing. She denies any diaphoresis, nausea, or vomiting. She's had no fever, chills, or abdominal pain. She has had no prior history of heart disease. She denies any risk factors.  Review of Systems:  Other than noted above, the patient denies any of the following symptoms. Systemic:  No fever, chills, sweats, or fatigue. ENT:  No nasal congestion, rhinorrhea, or sore throat. Pulmonary:  No cough, wheezing, shortness of breath, sputum production, hemoptysis. Cardiac:  No palpitations, rapid heartbeat, dizziness, presyncope or syncope. GI:  No abdominal pain, heartburn, nausea, or vomiting. Ext:  No leg pain or swelling.  PMFSH:  Past medical history, family history, social history, meds, and allergies were reviewed and updated as needed. She's allergic to eggs. She uses a hormone patch. She's had asthma for years, but this usually occurs in the springtime. Her father died at age 75 of an MI. Her mother has high blood pressure.  Physical Exam:   Vital signs:  BP 129/87  Pulse 80  Temp(Src) 98 F (36.7 C) (Oral)  Resp 20  SpO2 100% Gen:  Alert, oriented, in no distress, skin warm and dry. Eye:  PERRL, lids and conjunctivas normal.  Sclera non-icteric. ENT:  Mucous membranes moist, pharynx clear. Neck:  Supple, no adenopathy or tenderness.  No JVD. Lungs:  Clear to auscultation, no wheezes, rales or rhonchi.  No respiratory distress. Heart:  Regular rhythm.  No gallops, murmers, clicks or rubs. Chest:  No  chest wall tenderness. Abdomen:  Soft, nontender, no organomegaly or mass.  Bowel sounds normal.  No pulsatile abdominal mass or bruit. Ext:  No edema.  No calf tenderness and Homann's sign negative.  Pulses full and equal. Skin:  Warm and dry.  No rash.  EKG:   Date: 07/15/2013  Rate: 77  Rhythm: normal sinus rhythm  QRS Axis: normal  Intervals: normal  ST/T Wave abnormalities: normal  Conduction Disutrbances:right bundle branch block  Narrative Interpretation: Normal sinus rhythm with right bundle branch block.  Old EKG Reviewed: none available  Course in Urgent Care Center:   She was placed on monitor, given oxygen at 2 L per minute via nasal cannula, and an IV was started with normal saline at 50 mL per hour. She was given aspirin 325 mg by mouth a nitroglycerin 0.4 mg sublingually. She will be transported to the hospital by [facility].  Assessment:  The encounter diagnosis was Unstable angina pectoris.  Plan:   1.  The following meds were prescribed:   New Prescriptions   No medications on file   2.  The patient was transported to the hospital via [facility] in stable condition.  Medical Decision Making:  52 year old female has 3 day history of chest tightness, shortness of breath, substernal pressure, DOE, dry cough, right arm pain, and bilateral neck and shoulder pain.  She has a history of asthma, but has not had any wheezing and her lungs are clear.  No risk factors.  EKG shows RBBB  which she was told she has had before.  She is being sent to ED for suspicion of unstable angina pectoris.     Reuben Likes, MD 07/15/13 1021

## 2013-07-15 NOTE — ED Notes (Signed)
Pt transported to CT ?

## 2013-07-15 NOTE — ED Notes (Signed)
Phlebotomy at the bedside  

## 2013-07-15 NOTE — ED Notes (Signed)
Pt returned from CT °

## 2013-07-15 NOTE — ED Notes (Signed)
Dr. Masneri at the bedside.  

## 2013-07-15 NOTE — Discharge Instructions (Signed)
We have determined that your problem requires further evaluation in the emergency department.  We will take care of your transport there.  Once at the emergency department, you will be evaluated by a provider and they will order whatever treatment or tests they deem necessary.  We cannot guarantee that they will do any specific test or do any specific treatment.  ° °

## 2013-07-15 NOTE — ED Notes (Signed)
Placed in gown.

## 2013-07-15 NOTE — ED Notes (Signed)
Dr. Redgie Grayer back at the bedside.

## 2013-07-16 ENCOUNTER — Observation Stay (HOSPITAL_COMMUNITY): Payer: BC Managed Care – PPO

## 2013-07-16 ENCOUNTER — Encounter (HOSPITAL_COMMUNITY): Payer: Self-pay | Admitting: Cardiology

## 2013-07-16 DIAGNOSIS — Z0389 Encounter for observation for other suspected diseases and conditions ruled out: Secondary | ICD-10-CM

## 2013-07-16 DIAGNOSIS — IMO0001 Reserved for inherently not codable concepts without codable children: Secondary | ICD-10-CM

## 2013-07-16 DIAGNOSIS — R079 Chest pain, unspecified: Secondary | ICD-10-CM

## 2013-07-16 DIAGNOSIS — R0609 Other forms of dyspnea: Secondary | ICD-10-CM

## 2013-07-16 DIAGNOSIS — R0600 Dyspnea, unspecified: Secondary | ICD-10-CM | POA: Diagnosis present

## 2013-07-16 DIAGNOSIS — I517 Cardiomegaly: Secondary | ICD-10-CM

## 2013-07-16 DIAGNOSIS — J45909 Unspecified asthma, uncomplicated: Secondary | ICD-10-CM

## 2013-07-16 DIAGNOSIS — D649 Anemia, unspecified: Secondary | ICD-10-CM

## 2013-07-16 DIAGNOSIS — I4510 Unspecified right bundle-branch block: Secondary | ICD-10-CM | POA: Diagnosis present

## 2013-07-16 LAB — CBC
HCT: 30.7 % — ABNORMAL LOW (ref 36.0–46.0)
Hemoglobin: 10.2 g/dL — ABNORMAL LOW (ref 12.0–15.0)
MCH: 30.1 pg (ref 26.0–34.0)
MCHC: 33.2 g/dL (ref 30.0–36.0)
MCV: 90.6 fL (ref 78.0–100.0)
Platelets: 306 10*3/uL (ref 150–400)
RBC: 3.39 MIL/uL — ABNORMAL LOW (ref 3.87–5.11)
RDW: 14.3 % (ref 11.5–15.5)
WBC: 8.4 10*3/uL (ref 4.0–10.5)

## 2013-07-16 LAB — BASIC METABOLIC PANEL
BUN: 13 mg/dL (ref 6–23)
CO2: 26 mEq/L (ref 19–32)
Calcium: 9 mg/dL (ref 8.4–10.5)
Chloride: 104 mEq/L (ref 96–112)
Creatinine, Ser: 0.95 mg/dL (ref 0.50–1.10)
GFR calc Af Amer: 80 mL/min — ABNORMAL LOW (ref >90)
GFR calc non Af Amer: 69 mL/min — ABNORMAL LOW (ref >90)
Glucose, Bld: 89 mg/dL (ref 70–99)
Potassium: 3.7 mEq/L (ref 3.5–5.1)
Sodium: 139 mEq/L (ref 135–145)

## 2013-07-16 LAB — TROPONIN I: Troponin I: <0.3 ng/mL (ref <0.30)

## 2013-07-16 MED ORDER — IPRATROPIUM BROMIDE 0.02 % IN SOLN
0.5000 mg | Freq: Three times a day (TID) | RESPIRATORY_TRACT | Status: DC
  Administered 2013-07-16: 0.5 mg via RESPIRATORY_TRACT
  Filled 2013-07-16: qty 2.5

## 2013-07-16 MED ORDER — ALBUTEROL SULFATE (5 MG/ML) 0.5% IN NEBU
2.5000 mg | INHALATION_SOLUTION | Freq: Three times a day (TID) | RESPIRATORY_TRACT | Status: DC
  Administered 2013-07-16: 2.5 mg via RESPIRATORY_TRACT
  Filled 2013-07-16: qty 0.5

## 2013-07-16 MED ORDER — TECHNETIUM TC 99M SESTAMIBI - CARDIOLITE
30.0000 | Freq: Once | INTRAVENOUS | Status: AC | PRN
  Administered 2013-07-16: 30 via INTRAVENOUS

## 2013-07-16 MED ORDER — HYDROCHLOROTHIAZIDE 12.5 MG PO CAPS
12.5000 mg | ORAL_CAPSULE | Freq: Every day | ORAL | Status: DC
  Filled 2013-07-16: qty 1

## 2013-07-16 MED ORDER — ALBUTEROL SULFATE HFA 108 (90 BASE) MCG/ACT IN AERS: 2.0000 | INHALATION_SPRAY | RESPIRATORY_TRACT | Status: DC | PRN

## 2013-07-16 MED ORDER — HYDROCHLOROTHIAZIDE 12.5 MG PO CAPS: 12.5000 mg | ORAL_CAPSULE | Freq: Every day | ORAL | Status: DC

## 2013-07-16 MED ORDER — TECHNETIUM TC 99M SESTAMIBI GENERIC - CARDIOLITE
10.0000 | Freq: Once | INTRAVENOUS | Status: AC | PRN
  Administered 2013-07-16: 10 via INTRAVENOUS

## 2013-07-16 MED ORDER — IPRATROPIUM BROMIDE 0.02 % IN SOLN: 0.5000 mg | RESPIRATORY_TRACT | Status: DC | PRN

## 2013-07-16 MED ORDER — ALBUTEROL SULFATE (5 MG/ML) 0.5% IN NEBU: 2.5000 mg | INHALATION_SOLUTION | Freq: Three times a day (TID) | RESPIRATORY_TRACT | Status: DC

## 2013-07-16 MED ORDER — REGADENOSON 0.4 MG/5ML IV SOLN
0.4000 mg | Freq: Once | INTRAVENOUS | Status: AC
  Administered 2013-07-16: 0.4 mg via INTRAVENOUS
  Filled 2013-07-16: qty 5

## 2013-07-16 MED ORDER — ALBUTEROL SULFATE (5 MG/ML) 0.5% IN NEBU: 2.5000 mg | INHALATION_SOLUTION | RESPIRATORY_TRACT | Status: DC | PRN

## 2013-07-16 NOTE — Progress Notes (Addendum)
[facility] PROGRESS NOTE  Angela Mcconnell ZOX:[id number] DOB: 11/05/1961 DOA: 07/15/2013 PCP: No PCP Per Patient  Assessment/Plan  Chest pain with shortness of breath this week with episode overnight that responded to morphine.   -  Telemetry unremarkable -  Troponins negative, however, story sounds concerning -  CTa negative for PE  -  ECHO pending -  Spoke with Dr. Tresa Endo from cardiology who will consult:  Possible stress test vs. Repeat catheterization? - SL Nitro PRN, Protonix, Maalox / GI Cocktail.  -  Continue asa -  Patient not on BB or statin at this time - will defer to cardiology  Asthma sounds clear, but may still be having some bronchospasm contributing to symptoms   CXR and CTa neg for PNA/PE. -  D/c PRN albuterol inhaler. -  Start duonebs to see if this helps symptoms.  Diet:  NPO Access:  PIV IVF:  KVO Proph:  lovenox  Code Status: full Family Communication: spoke with patient alone Disposition Plan: pending further eval for CP   Consultants:  [facility]  Procedures:  ECHO pending  CTa  Antibiotics:  None   HPI/Subjective:  Episode of chest heaviness with radiation to right jaw with associated SOB, lightheadedness, mild nausea, but without diaphoresis overnight.  ECG unchanged.  Got better with morphine.    Objective: Filed Vitals:   07/15/13 1458 07/15/13 2051 07/16/13 0021 07/16/13 0650  BP: 146/77 131/72 117/68 114/58  Pulse: 76 68 75 77  Temp: 97.7 F (36.5 C) 98.2 F (36.8 C) 97.9 F (36.6 C) 98.5 F (36.9 C)  TempSrc:  Oral Oral Oral  Resp: 16 16 18 16   Height:      Weight:      SpO2: 100% 100% 100% 100%    Intake/Output Summary (Last 24 hours) at 07/16/13 0841 Last data filed at 07/15/13 1950  Gross per 24 hour  Intake   1000 ml  Output    800 ml  Net    200 ml   Filed Weights   07/15/13 1057  Weight: 81.194 kg (179 lb)    Exam:   General:  Overweight AAF, No acute distress  HEENT:  NCAT,  MMM  Cardiovascular:  RRR, nl S1, S2 no mrg, 2+ pulses, warm extremities  Respiratory:  CTAB, no increased WOB  Abdomen:   NABS, soft, NT/ND  MSK:   Normal tone and bulk, no LEE  Neuro:  Grossly intact  Data Reviewed: Basic Metabolic Panel:  Recent Labs Lab 07/15/13 1136 07/15/13 1543 07/16/13 0125  NA 139  --  139  K 3.7  --  3.7  CL 105  --  104  CO2 24  --  26  GLUCOSE 87  --  89  BUN 12  --  13  CREATININE 0.90 0.84 0.95  CALCIUM 9.5  --  9.0   Liver Function Tests: No results found for this basename: AST, ALT, ALKPHOS, BILITOT, PROT, ALBUMIN,  in the last 168 hours No results found for this basename: LIPASE, AMYLASE,  in the last 168 hours No results found for this basename: AMMONIA,  in the last 168 hours CBC:  Recent Labs Lab 07/15/13 1136 07/15/13 1543 07/16/13 0125  WBC 8.1 9.4 8.4  HGB 11.1* 10.4* 10.2*  HCT 32.1* 30.4* 30.7*  MCV 89.4 89.4 90.6  PLT 323 316 306   Cardiac Enzymes:  Recent Labs Lab 07/15/13 1310 07/15/13 1824 07/16/13 0133  TROPONINI <0.30 <0.30 <0.30   BNP (last 3 results) No  results found for this basename: PROBNP,  in the last 8760 hours CBG: No results found for this basename: GLUCAP,  in the last 168 hours  Recent Results (from the past 240 hour(s))  MRSA PCR SCREENING     Status: None   Collection Time    07/15/13  7:11 PM      Result Value Range Status   MRSA by PCR NEGATIVE  NEGATIVE Final   Comment:            The GeneXpert MRSA Assay (FDA     approved for NASAL specimens     only), is one component of a     comprehensive MRSA colonization     surveillance program. It is not     intended to diagnose MRSA     infection nor to guide or     monitor treatment for     MRSA infections.     Studies: Ct Angio Chest Pe W/cm &/or Wo Cm  07/15/2013   *RADIOLOGY REPORT*  Clinical Data: Dry cough and body aches with chest heaviness.  CT ANGIOGRAPHY CHEST  Technique:  Multidetector CT imaging of the chest using the  standard protocol during bolus administration of intravenous contrast. Multiplanar reconstructed images including MIPs were obtained and reviewed to evaluate the vascular anatomy.  Contrast: OMNIPAQUE IOHEXOL 350 MG/ML SOLN  Comparison: 09/05/2006.  Findings: No pulmonary embolus.  No pathologically enlarged mediastinal, hilar or axillary lymph nodes.  Heart is at the upper limits of normal in size.  No pericardial effusion.  Minimal dependent atelectasis in the lower lobes.  Lungs are otherwise clear.  No pleural fluid.  Airway is unremarkable.  Incidental imaging of the upper abdomen shows no acute findings. No worrisome lytic or sclerotic lesions.  IMPRESSION: Negative for pulmonary embolus.  No findings to explain the patient's given symptoms.   Original Report Authenticated By: Leanna Battles, M.D.   Dg Chest Port 1 View  07/15/2013   *RADIOLOGY REPORT*  Clinical Data: Mid chest pain with difficulty breathing.  Coughing.  PORTABLE CHEST - 1 VIEW  Comparison: 11/11/2012.  Findings: Trachea is midline.  Heart size normal.  Lungs are clear. No pleural fluid.  IMPRESSION: No acute findings.   Original Report Authenticated By: Leanna Battles, M.D.    Scheduled Meds: . aspirin EC  81 mg Oral Daily  . enoxaparin (LOVENOX) injection  40 mg Subcutaneous Q24H  . [START ON 07/17/2013] estradiol  0.05 mg Transdermal Custom  . multivitamin with minerals  1 tablet Oral Daily  . pantoprazole  40 mg Oral Daily  . sodium chloride  3 mL Intravenous Q12H  . sodium chloride  3 mL Intravenous Q12H   Continuous Infusions:   Active Problems:   Chest pain at rest   PND (paroxysmal nocturnal dyspnea)   Asthma    Time spent: 30 min    Hildy Nicholl, [facility]  [facility] Pager [phone]. If 7PM-7AM, please contact night-coverage at [email or web address], password [facility] 07/16/2013, 8:41 AM  LOS: 1 day

## 2013-07-16 NOTE — Progress Notes (Signed)
Echocardiogram 2D Echocardiogram has been performed.  Safwan Tomei 07/16/2013, 9:37 AM

## 2013-07-16 NOTE — Progress Notes (Signed)
Pt c/o 6/10 mid chest pressure around 0020. VS stable and charted. Pt given 2 mg IV morpine and was pain free around 0050. Enzymes negative times 3. Will continue to monitor the pt. Sanda Linger, RN

## 2013-07-16 NOTE — Care Management (Signed)
Case Manager Provided pt with the [facility] Number for new PCP. NO further needs from CM at this time. Gala Lewandowsky, RN,BSN [phone]

## 2013-07-16 NOTE — Consult Note (Signed)
Reason for Consult: Chest pain, SOB  Requesting Physician: Claybon Jabs  HPI: This is a 52 y.o. female with a past medical history significant for prior cath in Nov 2010 that showed Nl coronaries and Nl LVF. She presented to an urgent care yesterday with complaints of SOB and DOE for several days. She also mentioned she had some SSCP "pressure". She was sent [facility] ER for further evaluation. She feels her symptoms were secondary to her asthma though she admits to have some jaw and arm pain earlier in the week. Her CTA, Troponin, and CXR are all normal.   PMHx:  Past Medical History  Diagnosis Date  . Asthma   . Palpitations   . Shortness of breath   . Arthritis    Past Surgical History  Procedure Laterality Date  . Abdominal hysterectomy    . Knee surgery    . Coronary angiogram  Nov 2010    Normal coronary arteries    FAMHx: Family History  Problem Relation Age of Onset  . Cancer Sister     SOCHx:  reports that she has never smoked. She does not have any smokeless tobacco history on file. She reports that she does not drink alcohol or use illicit drugs.  ALLERGIES: Allergies  Allergen Reactions  . Eggs Or Egg-Derived Products     Hives, mouth and throat itching    ROS: A comprehensive review of systems was negative. She denies any recent febrile illness.  She has occasional LE edema  HOME MEDICATIONS: Prescriptions prior to admission  Medication Sig Dispense Refill  . BIOTIN PO Take 1 tablet by mouth daily.      Marland Kitchen Ephedrine-Guaifenesin (BRONKAID PO) Take 1 tablet by mouth daily as needed (for difficulty breathing).       Marland Kitchen estradiol (VIVELLE-DOT) 0.05 MG/24HR Place 1 patch onto the skin 2 (two) times a week. On Saturday and Wednesday      . guaiFENesin (MUCINEX) 600 MG 12 hr tablet Take 1,200 mg by mouth 2 (two) times daily as needed for congestion.      . Multiple Vitamins-Minerals (MULTIVITAMIN WITH MINERALS) tablet Take 1 tablet by mouth daily.        HOSPITAL  MEDICATIONS: I have reviewed the patient's current medications.  VITALS: Blood pressure 114/58, pulse 77, temperature 98.5 F (36.9 C), temperature source Oral, resp. rate 16, height 5\' 3"  (1.6 m), weight 179 lb (81.194 kg), SpO2 100.00%.  PHYSICAL EXAM: General appearance: alert, cooperative, appears stated age and no distress Neck: no carotid bruit and no JVD Lungs: clear to auscultation bilaterally Heart: regular rate and rhythm, S1, S2 normal, no murmur, click, rub or gallop Abdomen: soft, non-tender; bowel sounds normal; no masses,  no organomegaly Extremities: extremities normal, atraumatic, no cyanosis or edema Pulses: 2+ and symmetric Skin: Skin color, texture, turgor normal. No rashes or lesions Neurologic: Grossly normal  LABS: Results for orders placed during the hospital encounter of 07/15/13 (from the past 48 hour(s))  CBC     Status: Abnormal   Collection Time    07/15/13 11:36 AM      Result Value Range   WBC 8.1  4.0 - 10.5 K/uL   RBC 3.59 (*) 3.87 - 5.11 MIL/uL   Hemoglobin 11.1 (*) 12.0 - 15.0 g/dL   HCT 60.4 (*) 54.0 - 98.1 %   MCV 89.4  78.0 - 100.0 fL   MCH 30.9  26.0 - 34.0 pg   MCHC 34.6  30.0 - 36.0 g/dL   RDW  14.1  11.5 - 15.5 %   Platelets 323  150 - 400 K/uL  BASIC METABOLIC PANEL     Status: Abnormal   Collection Time    07/15/13 11:36 AM      Result Value Range   Sodium 139  135 - 145 mEq/L   Potassium 3.7  3.5 - 5.1 mEq/L   Chloride 105  96 - 112 mEq/L   CO2 24  19 - 32 mEq/L   Glucose, Bld 87  70 - 99 mg/dL   BUN 12  6 - 23 mg/dL   Creatinine, Ser 1.61  0.50 - 1.10 mg/dL   Calcium 9.5  8.4 - 09.6 mg/dL   GFR calc non Af Amer 73 (*) >90 mL/min   GFR calc Af Amer 85 (*) >90 mL/min   Comment: (NOTE)     The eGFR has been calculated using the CKD EPI equation.     This calculation has not been validated in all clinical situations.     eGFR's persistently <90 mL/min signify possible Chronic Kidney     Disease.  POCT I-STAT TROPONIN I      Status: None   Collection Time    07/15/13 11:38 AM      Result Value Range   Troponin i, poc 0.00  0.00 - 0.08 ng/mL   Comment 3            Comment: Due to the release kinetics of cTnI,     a negative result within the first hours     of the onset of symptoms does not rule out     myocardial infarction with certainty.     If myocardial infarction is still suspected,     repeat the test at appropriate intervals.  TROPONIN I     Status: None   Collection Time    07/15/13  1:10 PM      Result Value Range   Troponin I <0.30  <0.30 ng/mL   Comment:            Due to the release kinetics of cTnI,     a negative result within the first hours     of the onset of symptoms does not rule out     myocardial infarction with certainty.     If myocardial infarction is still suspected,     repeat the test at appropriate intervals.  D-DIMER, QUANTITATIVE     Status: None   Collection Time    07/15/13  1:18 PM      Result Value Range   D-Dimer, Quant <0.27  0.00 - 0.48 ug/mL-FEU   Comment:            AT THE INHOUSE ESTABLISHED CUTOFF     VALUE OF 0.48 ug/mL FEU,     THIS ASSAY HAS BEEN DOCUMENTED     IN THE LITERATURE TO HAVE     A SENSITIVITY AND NEGATIVE     PREDICTIVE VALUE OF AT LEAST     98 TO 99%.  THE TEST RESULT     SHOULD BE CORRELATED WITH     AN ASSESSMENT OF THE CLINICAL     PROBABILITY OF DVT / VTE.  CBC     Status: Abnormal   Collection Time    07/15/13  3:43 PM      Result Value Range   WBC 9.4  4.0 - 10.5 K/uL   RBC 3.40 (*) 3.87 - 5.11 MIL/uL   Hemoglobin 10.4 (*)  12.0 - 15.0 g/dL   HCT 16.1 (*) 09.6 - 04.5 %   MCV 89.4  78.0 - 100.0 fL   MCH 30.6  26.0 - 34.0 pg   MCHC 34.2  30.0 - 36.0 g/dL   RDW 40.9  81.1 - 91.4 %   Platelets 316  150 - 400 K/uL  CREATININE, SERUM     Status: Abnormal   Collection Time    07/15/13  3:43 PM      Result Value Range   Creatinine, Ser 0.84  0.50 - 1.10 mg/dL   GFR calc non Af Amer 80 (*) >90 mL/min   GFR calc Af Amer >90   >90 mL/min   Comment: (NOTE)     The eGFR has been calculated using the CKD EPI equation.     This calculation has not been validated in all clinical situations.     eGFR's persistently <90 mL/min signify possible Chronic Kidney     Disease.  TSH     Status: None   Collection Time    07/15/13  3:43 PM      Result Value Range   TSH 1.646  0.350 - 4.500 uIU/mL   Comment: Performed at [facility]  T4, FREE     Status: None   Collection Time    07/15/13  3:43 PM      Result Value Range   Free T4 1.02  0.80 - 1.80 ng/dL   Comment: Performed at [facility]  TROPONIN I     Status: None   Collection Time    07/15/13  6:24 PM      Result Value Range   Troponin I <0.30  <0.30 ng/mL   Comment:            Due to the release kinetics of cTnI,     a negative result within the first hours     of the onset of symptoms does not rule out     myocardial infarction with certainty.     If myocardial infarction is still suspected,     repeat the test at appropriate intervals.  MRSA PCR SCREENING     Status: None   Collection Time    07/15/13  7:11 PM      Result Value Range   MRSA by PCR NEGATIVE  NEGATIVE   Comment:            The GeneXpert MRSA Assay (FDA     approved for NASAL specimens     only), is one component of a     comprehensive MRSA colonization     surveillance program. It is not     intended to diagnose MRSA     infection nor to guide or     monitor treatment for     MRSA infections.  BASIC METABOLIC PANEL     Status: Abnormal   Collection Time    07/16/13  1:25 AM      Result Value Range   Sodium 139  135 - 145 mEq/L   Potassium 3.7  3.5 - 5.1 mEq/L   Chloride 104  96 - 112 mEq/L   CO2 26  19 - 32 mEq/L   Glucose, Bld 89  70 - 99 mg/dL   BUN 13  6 - 23 mg/dL   Creatinine, Ser 7.82  0.50 - 1.10 mg/dL   Calcium 9.0  8.4 - 95.6 mg/dL   GFR calc non Af Amer 69 (*) >90 mL/min  GFR calc Af Amer 80 (*) >90 mL/min   Comment: (NOTE)     The eGFR has been  calculated using the CKD EPI equation.     This calculation has not been validated in all clinical situations.     eGFR's persistently <90 mL/min signify possible Chronic Kidney     Disease.  CBC     Status: Abnormal   Collection Time    07/16/13  1:25 AM      Result Value Range   WBC 8.4  4.0 - 10.5 K/uL   RBC 3.39 (*) 3.87 - 5.11 MIL/uL   Hemoglobin 10.2 (*) 12.0 - 15.0 g/dL   HCT 14.7 (*) 82.9 - 56.2 %   MCV 90.6  78.0 - 100.0 fL   MCH 30.1  26.0 - 34.0 pg   MCHC 33.2  30.0 - 36.0 g/dL   RDW 13.0  86.5 - 78.4 %   Platelets 306  150 - 400 K/uL  TROPONIN I     Status: None   Collection Time    07/16/13  1:33 AM      Result Value Range   Troponin I <0.30  <0.30 ng/mL   Comment:            Due to the release kinetics of cTnI,     a negative result within the first hours     of the onset of symptoms does not rule out     myocardial infarction with certainty.     If myocardial infarction is still suspected,     repeat the test at appropriate intervals.    EKG: NSR RBBB (old)  IMAGING: Ct Angio Chest Pe W/cm &/or Wo Cm  07/15/2013   *RADIOLOGY REPORT*  Clinical Data: Dry cough and body aches with chest heaviness.  CT ANGIOGRAPHY CHEST  Technique:  Multidetector CT imaging of the chest using the standard protocol during bolus administration of intravenous contrast. Multiplanar reconstructed images including MIPs were obtained and reviewed to evaluate the vascular anatomy.  Contrast: OMNIPAQUE IOHEXOL 350 MG/ML SOLN  Comparison: 09/05/2006.  Findings: No pulmonary embolus.  No pathologically enlarged mediastinal, hilar or axillary lymph nodes.  Heart is at the upper limits of normal in size.  No pericardial effusion.  Minimal dependent atelectasis in the lower lobes.  Lungs are otherwise clear.  No pleural fluid.  Airway is unremarkable.  Incidental imaging of the upper abdomen shows no acute findings. No worrisome lytic or sclerotic lesions.  IMPRESSION: Negative for pulmonary  embolus.  No findings to explain the patient's given symptoms.   Original Report Authenticated By: Leanna Battles, M.D.   Dg Chest Port 1 View  07/15/2013   *RADIOLOGY REPORT*  Clinical Data: Mid chest pain with difficulty breathing.  Coughing.  PORTABLE CHEST - 1 VIEW  Comparison: 11/11/2012.  Findings: Trachea is midline.  Heart size normal.  Lungs are clear. No pleural fluid.  IMPRESSION: No acute findings.   Original Report Authenticated By: Leanna Battles, M.D.    IMPRESSION: Principal Problem:   Chest pain at rest Active Problems:   Asthma   Dyspnea on exertion   RBBB   Normal coronary arteries- Nov 2010   Anemia   RECOMMENDATION: MD to see, preliminary echo report is normal, MD to read. ? Myoview, she is NPO.  Time Spent Directly with Patient: 45 minutes  Abelino Derrick [phone] beeper 07/16/2013, 9:08 AM    Patient seen and examined. Agree with assessment and plan. Pt has a history of  asthma. Prior cath in 2010 revealed normal coronaries. Pt has had recurrent similar chest pain which she thinks is her asthma, but also has noticed left arm discomfort and shortnes of breath with activity. Will risk stratisfy with nuclear scan today and assess for ishemia.   Lennette Bihari, MD, [facility] 07/16/2013 9:28 AM

## 2013-07-16 NOTE — Progress Notes (Signed)
Utilization review completed.  

## 2013-07-16 NOTE — Discharge Summary (Signed)
Physician Discharge Summary  Blayne Garlick Kusek WUJ:[id number] DOB: 1961-10-23 DOA: 07/15/2013  PCP: No PCP Per Patient  Admit date: 07/15/2013 Discharge date: 07/16/2013  Recommendations for Outpatient Follow-up:  1. Primary care doctor within 1 week for BP check and BMP after starting HCTZ.  Further work up for anemia if not already complete.   2. Continue albuterol inhaler every 4-6 hours until follow up with primary care doctor.  Consider inhaled or systemic steroids if SOB continues.    Discharge Diagnoses:  Principal Problem:   Chest pain at rest Active Problems:   Asthma   RBBB   Normal coronary arteries- Nov 2010   Dyspnea on exertion   Anemia   Discharge Condition: stable, improved  Diet recommendation:  regular  Wt Readings from Last 3 Encounters:  07/15/13 81.194 kg (179 lb)    History of present illness:  Angela Mcconnell is a 52 y.o. female with a history of asthma and right bundle branch block, who had a clean cardiac cath in 10/2009 by [facility]. She presented to [facility]gent care this am with complaints of chest pain and shortness of breath. Per Ms. Gettis, Sunday and Monday she felt very fatigued and developed a dry cough which she attributed to seasonal allergies. On Tuesday night she was awakened from sleep with sudden shortness of breath and mid sternal chest pain. She had to sit up for the rest of the night. She normally exercises (water aerobics) and takes the stairs every day at work. She noticed decreased exercise tolerance this week in that she could not climb the stairs. Wednesday night she experienced PND again and noticed chest pain radiating to her right jaw and right arm. She went to urgent care this morning and was sent to the emergency department for further work up. She does not drink alcohol or smoke tobacco. She does not have DM, HTN, HLD. She is not obese. She has had no recent travel or surgery, but she is sedentary at work and has an estrogen patch. POC  troponin is negative, EKG shows pre-existing RBBB, D-Dimer and CT Angio Chest are ordered.  Hospital Course:   Chest pain with shortness of breath.  Story was concerning for possible angina or asthma.  Her pain responded to morphine. Her telemetry was unremarkable and her troponins were negative. Her CT angiogram the chest was negative for pulmonary embolism or pneumonia. Echocardiogram demonstrated mild LVH with mild concentric hypertrophy and an ejection fraction of 60-65%.  Valves were normal. She underwent a nuclear medicine stress test which demonstrated no areas of reversible or irreversible ischemia.  She was given Protonix, Maalox.  She was given a breathing treatment which may have helped her symptoms somewhat.  Recommended to continue using her albuterol inhaler at home. Follow up with her primary care doctor.  LVH, suggestive of underlying hypertension and her blood pressures have been somewhat elevated.  We will start her on HCTZ and have her followup with her primary care doctor in approximately one to 2 weeks.   Asthma, with clear respiratory exam. Patient is not coughing frequently and does not have forced expiratory phase or wheezing. She was given a breathing treatment and this may have helped her symptoms. Recommend she continue using an albuterol inhaler at home.  Normocytic anemia, hgb stable.  Follow up with primary care doctor for further investigation if not already complete.    Consultants:  [facility] Procedures:  ECHO pending  CTa Antibiotics:  None    Discharge Exam:  Filed Vitals:   07/16/13 1400  BP: 138/81  Pulse: 91  Temp: 99.2 F (37.3 C)  Resp: 20   Filed Vitals:   07/16/13 1208 07/16/13 1210 07/16/13 1250 07/16/13 1400  BP: 122/60 130/51  138/81  Pulse: 111 100 84 91  Temp:    99.2 F (37.3 C)  TempSrc:    Oral  Resp:   16 20  Height:      Weight:      SpO2:   100% 100%    General: Overweight AAF, No acute distress  HEENT: NCAT, MMM   Cardiovascular: RRR, nl S1, S2 no mrg, 2+ pulses, warm extremities  Respiratory: CTAB, no increased WOB  Abdomen: NABS, soft, NT/ND  MSK: Normal tone and bulk, no LEE  Neuro: Grossly intact   Discharge Instructions      Discharge Orders   Future Orders Complete By Expires   Call MD for:  difficulty breathing, headache or visual disturbances  As directed    Call MD for:  extreme fatigue  As directed    Call MD for:  hives  As directed    Call MD for:  persistant dizziness or light-headedness  As directed    Call MD for:  persistant nausea and vomiting  As directed    Call MD for:  severe uncontrolled pain  As directed    Call MD for:  temperature >100.4  As directed    Diet general  As directed    Discharge instructions  As directed    Comments:     Follow up with your primary care doctor in one week.  Please continue to use your albuterol inhaler every 4-6 hours at home until you follow up with your doctor.  You were tested for heart attack and blood clot in the lungs and all of your tests have been negative.  Your blood tests for heart attack were normal, your ultrasound of your heart showed normal function.  The muscle of your heart is a little thickened, which is usually caused by mildly elevated blood pressure.  I have given you a prescription for blood pressure medication HCTZ to try.  Please have your primary care doctor check your blood pressure and potassium at your follow up appointment.  Your stress test was negative.  Please try over the counter ranitidine for the next few weeks to see if this will help your chest pain.  You may also try naprosyn, or aleve, for your chest pain.  If you continue to have shortness of breath, talk to your doctor about trying some inhaled steroids to suppress inflammation from asthma.   Increase activity slowly  As directed        Medication List         albuterol 108 (90 BASE) MCG/ACT inhaler  Commonly known as:  PROVENTIL HFA;VENTOLIN HFA   Inhale 2 puffs into the lungs every 4 (four) hours as needed for wheezing or shortness of breath.     BIOTIN PO  Take 1 tablet by mouth daily.     BRONKAID PO  Take 1 tablet by mouth daily as needed (for difficulty breathing).     estradiol 0.05 MG/24HR patch  Commonly known as:  VIVELLE-DOT  Place 1 patch onto the skin 2 (two) times a week. On Saturday and Wednesday     guaiFENesin 600 MG 12 hr tablet  Commonly known as:  MUCINEX  Take 1,200 mg by mouth 2 (two) times daily as needed for congestion.  hydrochlorothiazide 12.5 MG capsule  Commonly known as:  MICROZIDE  Take 1 capsule (12.5 mg total) by mouth daily.     multivitamin with minerals tablet  Take 1 tablet by mouth daily.       Follow-up Information   Follow up with No PCP Per Patient. Schedule an appointment as soon as possible for a visit in 1 week.   Specialty:  General Practice   Contact information:   [address] [place] [address] [phone]        The results of significant diagnostics from this hospitalization (including imaging, microbiology, ancillary and laboratory) are listed below for reference.    Significant Diagnostic Studies: Ct Angio Chest Pe W/cm &/or Wo Cm  07/15/2013   *RADIOLOGY REPORT*  Clinical Data: Dry cough and body aches with chest heaviness.  CT ANGIOGRAPHY CHEST  Technique:  Multidetector CT imaging of the chest using the standard protocol during bolus administration of intravenous contrast. Multiplanar reconstructed images including MIPs were obtained and reviewed to evaluate the vascular anatomy.  Contrast: OMNIPAQUE IOHEXOL 350 MG/ML SOLN  Comparison: 09/05/2006.  Findings: No pulmonary embolus.  No pathologically enlarged mediastinal, hilar or axillary lymph nodes.  Heart is at the upper limits of normal in size.  No pericardial effusion.  Minimal dependent atelectasis in the lower lobes.  Lungs are otherwise clear.  No pleural fluid.  Airway is unremarkable.   Incidental imaging of the upper abdomen shows no acute findings. No worrisome lytic or sclerotic lesions.  IMPRESSION: Negative for pulmonary embolus.  No findings to explain the patient's given symptoms.   Original Report Authenticated By: Leanna Battles, M.D.   Nm Myocar Multi W/spect W/wall Motion / Ef  07/16/2013   *RADIOLOGY REPORT*  Clinical Data:  Chest pain.  Technique:  Standard myocardial SPECT imaging performed after resting intravenous injection of 10 mCi Tc-66m sestimibi. Subsequently, intravenous infusion of regadenoson performed under the supervision of the Cardiology staff.  At peak effect of the drug, 30 mCi Tc-36m sestimibi injected intravenously and standard myocardial SPECT imaging performed.  Quantitative gated imaging also performed to evaluate left ventricular wall motion and estimate left ventricular ejection fraction.  Comparison:  None  MYOCARDIAL IMAGING WITH SPECT (REST AND PHARMACOLOGIC-STRESS)  Findings:  The left ventricle myocardial perfusion is normal.  No evidence for a fixed or reversible defect.  GATED LEFT VENTRICULAR WALL MOTION STUDY  Findings:  Review of the gated images demonstrates normal wall motion.  LEFT VENTRICULAR EJECTION FRACTION  Findings:  QGS ejection fraction measures 78% , with an end- diastolic volume of 49 ml and an end-systolic volume of 11 ml.  IMPRESSION: Normal myocardial perfusion examination.  No evidence for pharmacologically induced ischemia.   Original Report Authenticated By: Richarda Overlie, M.D.   Dg Chest Port 1 View  07/15/2013   *RADIOLOGY REPORT*  Clinical Data: Mid chest pain with difficulty breathing.  Coughing.  PORTABLE CHEST - 1 VIEW  Comparison: 11/11/2012.  Findings: Trachea is midline.  Heart size normal.  Lungs are clear. No pleural fluid.  IMPRESSION: No acute findings.   Original Report Authenticated By: Leanna Battles, M.D.    Microbiology: Recent Results (from the past 240 hour(s))  MRSA PCR SCREENING     Status: None    Collection Time    07/15/13  7:11 PM      Result Value Range Status   MRSA by PCR NEGATIVE  NEGATIVE Final   Comment:  The GeneXpert MRSA Assay (FDA     approved for NASAL specimens     only), is one component of a     comprehensive MRSA colonization     surveillance program. It is not     intended to diagnose MRSA     infection nor to guide or     monitor treatment for     MRSA infections.     ------------------------------------------------------------ LV EF: 60% - 65%  ------------------------------------------------------------ Indications: Shortness of breath 786.05.  ------------------------------------------------------------ History: Risk factors: Asthma.  ------------------------------------------------------------ Study Conclusions  Left ventricle: The cavity size was normal. Wall thickness was increased in a pattern of mild LVH. There was mild concentric hypertrophy. Systolic function was normal. The estimated ejection fraction was in the range of 60% to 65%. Left ventricular diastolic function parameters were normal. Transthoracic echocardiography. M-mode, complete 2D, spectral Doppler, and color Doppler. Height: Height: 160cm. Height: 63in. Weight: Weight: 81.4kg. Weight: 179lb. Body mass index: BMI: 31.8kg/m^2. Body surface area: BSA: 1.79m^2. Blood pressure: 114/58. Patient status: Inpatient. Location: Bedside.  ------------------------------------------------------------  ------------------------------------------------------------ Left ventricle: The cavity size was normal. Wall thickness was increased in a pattern of mild LVH. There was mild concentric hypertrophy. Systolic function was normal. The estimated ejection fraction was in the range of 60% to 65%. The transmitral flow pattern was normal. The deceleration time of the early transmitral flow velocity was normal. The pulmonary vein flow pattern was normal. The tissue  Doppler parameters were normal. Left ventricular diastolic function parameters were normal.  ------------------------------------------------------------ Aortic valve: Structurally normal valve. Cusp separation was normal. Doppler: Transvalvular velocity was within the normal range. There was no stenosis. No regurgitation.  ------------------------------------------------------------ Mitral valve: Structurally normal valve. Leaflet separation was normal. Doppler: Transvalvular velocity was within the normal range. There was no evidence for stenosis. No regurgitation. Peak gradient: 3mm Hg (D).  ------------------------------------------------------------ Left atrium: The atrium was normal in size.  ------------------------------------------------------------ Right ventricle: The cavity size was normal. Wall thickness was normal. Systolic function was normal.  ------------------------------------------------------------ Pulmonic valve: Structurally normal valve. Cusp separation was normal. Doppler: Transvalvular velocity was within the normal range. No regurgitation.  ------------------------------------------------------------ Tricuspid valve: Structurally normal valve. Leaflet separation was normal. Doppler: Transvalvular velocity was within the normal range. No regurgitation.  ------------------------------------------------------------ Pulmonary artery: The main pulmonary artery was normal-sized.  ------------------------------------------------------------ Right atrium: The atrium was normal in size.  ------------------------------------------------------------ Pericardium: The pericardium was normal in appearance.  ------------------------------------------------------------ Systemic veins: Inferior vena cava: The vessel was normal in size; the respirophasic diameter changes were in the normal range (= 50%); findings are consistent with normal central  venous pressure.  ------------------------------------------------------------  2D measurements Normal Doppler measurements Normal Left ventricle Left ventricle LVID ED, 38.6 mm 43-52 Ea, lat ann, 9.6 cm/s ------ chord, tiss DP 5 PLAX E/Ea, lat 9.3 ------ LVID ES, 21.7 mm 23-38 ann, tiss DP 6 chord, Ea, med ann, 9.2 cm/s ------ PLAX tiss DP 1 FS, chord, 44 % >29 E/Ea, med 9.8 ------ PLAX ann, tiss DP LVPW, ED 11.3 mm ------ Mitral valve IVS/LVPW 0.96 <1.3 Peak E vel 90. cm/s ------ ratio, ED 3 Ventricular septum Peak A vel 87. cm/s ------ IVS, ED 10.8 mm ------ 4 Aorta Deceleration 176 ms 150-23 Root diam, 24 mm ------ time 0 ED Peak 3 mm ------ Left atrium gradient, D Hg AP dim 27 mm ------ Peak E/A 1 ------ AP dim 1.4 cm/m^2 <2.2 ratio index Right ventricle RVID ED, 32 mm 19-38 PLAX    Labs: Basic Metabolic Panel:  Recent Labs Lab 07/15/13 1136 07/15/13 1543 07/16/13 0125  NA 139  --  139  K 3.7  --  3.7  CL 105  --  104  CO2 24  --  26  GLUCOSE 87  --  89  BUN 12  --  13  CREATININE 0.90 0.84 0.95  CALCIUM 9.5  --  9.0   Liver Function Tests: No results found for this basename: AST, ALT, ALKPHOS, BILITOT, PROT, ALBUMIN,  in the last 168 hours No results found for this basename: LIPASE, AMYLASE,  in the last 168 hours No results found for this basename: AMMONIA,  in the last 168 hours CBC:  Recent Labs Lab 07/15/13 1136 07/15/13 1543 07/16/13 0125  WBC 8.1 9.4 8.4  HGB 11.1* 10.4* 10.2*  HCT 32.1* 30.4* 30.7*  MCV 89.4 89.4 90.6  PLT 323 316 306   Cardiac Enzymes:  Recent Labs Lab 07/15/13 1310 07/15/13 1824 07/16/13 0133  TROPONINI <0.30 <0.30 <0.30   BNP: BNP (last 3 results) No results found for this basename: PROBNP,  in the last 8760 hours CBG: No results found for this basename: GLUCAP,  in the last 168 hours  Time coordinating discharge: 45 minutes  Signed:  Laci Frenkel  [facility] 07/16/2013, 3:19 PM

## 2014-02-08 ENCOUNTER — Other Ambulatory Visit: Payer: Self-pay

## 2014-02-08 DIAGNOSIS — Z1231 Encounter for screening mammogram for malignant neoplasm of breast: Secondary | ICD-10-CM

## 2014-03-17 ENCOUNTER — Ambulatory Visit
Admission: RE | Admit: 2014-03-17 | Discharge: 2014-03-17 | Disposition: A | Discharge status: Home / Self Care | Payer: BC Managed Care – PPO | Source: Ambulatory Visit

## 2014-03-17 DIAGNOSIS — Z1231 Encounter for screening mammogram for malignant neoplasm of breast: Secondary | ICD-10-CM

## 2015-02-14 ENCOUNTER — Other Ambulatory Visit: Payer: Self-pay

## 2015-02-14 DIAGNOSIS — Z1231 Encounter for screening mammogram for malignant neoplasm of breast: Secondary | ICD-10-CM

## 2015-03-20 ENCOUNTER — Ambulatory Visit
Admission: RE | Admit: 2015-03-20 | Discharge: 2015-03-20 | Disposition: A | Discharge status: Home / Self Care | Payer: BLUE CROSS/BLUE SHIELD | Source: Ambulatory Visit

## 2015-03-20 DIAGNOSIS — Z1231 Encounter for screening mammogram for malignant neoplasm of breast: Secondary | ICD-10-CM

## 2015-07-10 ENCOUNTER — Encounter (HOSPITAL_COMMUNITY): Payer: Self-pay

## 2015-07-10 ENCOUNTER — Emergency Department (HOSPITAL_COMMUNITY)
Admission: EM | Admit: 2015-07-10 | Discharge: 2015-07-10 | Disposition: A | Discharge status: Home / Self Care | Payer: BLUE CROSS/BLUE SHIELD | Attending: Emergency Medicine | Admitting: Emergency Medicine

## 2015-07-10 ENCOUNTER — Emergency Department (HOSPITAL_COMMUNITY): Payer: BLUE CROSS/BLUE SHIELD

## 2015-07-10 DIAGNOSIS — R079 Chest pain, unspecified: Secondary | ICD-10-CM | POA: Diagnosis present

## 2015-07-10 DIAGNOSIS — J45901 Unspecified asthma with (acute) exacerbation: Secondary | ICD-10-CM | POA: Insufficient documentation

## 2015-07-10 DIAGNOSIS — M1990 Unspecified osteoarthritis, unspecified site: Secondary | ICD-10-CM | POA: Insufficient documentation

## 2015-07-10 DIAGNOSIS — Z79899 Other long term (current) drug therapy: Secondary | ICD-10-CM | POA: Diagnosis not present

## 2015-07-10 LAB — CBC WITH DIFFERENTIAL/PLATELET
Basophils Absolute: 0 10*3/uL (ref 0.0–0.1)
Basophils Relative: 0 % (ref 0–1)
Eosinophils Absolute: 0.2 10*3/uL (ref 0.0–0.7)
Eosinophils Relative: 2 % (ref 0–5)
HCT: 33.2 % — ABNORMAL LOW (ref 36.0–46.0)
Hemoglobin: 10.7 g/dL — ABNORMAL LOW (ref 12.0–15.0)
Lymphocytes Relative: 37 % (ref 12–46)
Lymphs Abs: 3.7 10*3/uL (ref 0.7–4.0)
MCH: 29.2 pg (ref 26.0–34.0)
MCHC: 32.2 g/dL (ref 30.0–36.0)
MCV: 90.7 fL (ref 78.0–100.0)
Monocytes Absolute: 0.5 10*3/uL (ref 0.1–1.0)
Monocytes Relative: 5 % (ref 3–12)
Neutro Abs: 5.6 10*3/uL (ref 1.7–7.7)
Neutrophils Relative %: 56 % (ref 43–77)
Platelets: 339 10*3/uL (ref 150–400)
RBC: 3.66 MIL/uL — ABNORMAL LOW (ref 3.87–5.11)
RDW: 14.5 % (ref 11.5–15.5)
WBC: 10 10*3/uL (ref 4.0–10.5)

## 2015-07-10 LAB — COMPREHENSIVE METABOLIC PANEL
ALT: 16 U/L (ref 14–54)
AST: 24 U/L (ref 15–41)
Albumin: 3.8 g/dL (ref 3.5–5.0)
Alkaline Phosphatase: 81 U/L (ref 38–126)
Anion gap: 8 (ref 5–15)
BUN: 15 mg/dL (ref 6–20)
CO2: 26 mmol/L (ref 22–32)
Calcium: 9.3 mg/dL (ref 8.9–10.3)
Chloride: 104 mmol/L (ref 101–111)
Creatinine, Ser: 0.82 mg/dL (ref 0.44–1.00)
GFR calc Af Amer: >60 mL/min (ref >60)
GFR calc non Af Amer: >60 mL/min (ref >60)
Glucose, Bld: 90 mg/dL (ref 65–99)
Potassium: 4.1 mmol/L (ref 3.5–5.1)
Sodium: 138 mmol/L (ref 135–145)
Total Bilirubin: 0.2 mg/dL — ABNORMAL LOW (ref 0.3–1.2)
Total Protein: 7.5 g/dL (ref 6.5–8.1)

## 2015-07-10 LAB — D-DIMER, QUANTITATIVE: D-Dimer, Quant: <0.27 ug/mL-FEU (ref 0.00–0.48)

## 2015-07-10 LAB — BRAIN NATRIURETIC PEPTIDE: B Natriuretic Peptide: 2.8 pg/mL (ref 0.0–100.0)

## 2015-07-10 LAB — TROPONIN I
Troponin I: <0.03 ng/mL (ref <0.031)
Troponin I: <0.03 ng/mL (ref <0.031)

## 2015-07-10 MED ORDER — MORPHINE SULFATE 4 MG/ML IJ SOLN
4.0000 mg | Freq: Once | INTRAMUSCULAR | Status: AC
  Administered 2015-07-10: 4 mg via INTRAVENOUS
  Filled 2015-07-10: qty 1

## 2015-07-10 MED ORDER — TRAMADOL HCL 50 MG PO TABS: 50.0000 mg | ORAL_TABLET | Freq: Four times a day (QID) | ORAL | Status: DC | PRN

## 2015-07-10 MED ORDER — PREDNISONE 20 MG PO TABS: 40.0000 mg | ORAL_TABLET | Freq: Every day | ORAL | Status: DC

## 2015-07-10 MED ORDER — ONDANSETRON HCL 4 MG/2ML IJ SOLN
4.0000 mg | Freq: Once | INTRAMUSCULAR | Status: AC
  Administered 2015-07-10: 4 mg via INTRAVENOUS
  Filled 2015-07-10: qty 2

## 2015-07-10 MED ORDER — ALBUTEROL SULFATE HFA 108 (90 BASE) MCG/ACT IN AERS
2.0000 | INHALATION_SPRAY | RESPIRATORY_TRACT | Status: DC
  Administered 2015-07-10: 2 via RESPIRATORY_TRACT
  Filled 2015-07-10: qty 6.7

## 2015-07-10 NOTE — Discharge Instructions (Signed)

## 2015-07-10 NOTE — ED Provider Notes (Signed)
CSN: [id number]     Arrival date & time 07/10/15  1617 History   First MD Initiated Contact with Patient 07/10/15 1619     Chief Complaint  Patient presents with  . Chest Pain     (Consider location/radiation/quality/duration/timing/severity/associated sxs/prior Treatment) HPI Comments: Patient presents to the emergency room for evaluation of chest pain and shortness of breath. Patient reports that symptoms began 2 days ago. The first day she was experiencing sharp and stabbing pain in the left side of her chest that was constant for approximately 2 hours and then resolved. Since then she has had intermittent chest discomfort, but not as severe as 2 days ago. Today she started to notice shortness of breath. Patient reports that she walked approximately 1 block and became very short of breath, which is unusual for her.  Patient reports that she has had similar symptoms in the past. She reports that when she had this last time, she had a heart catheterization and there were no blockages. She was told that it was "stress".  Patient is a 54 y.o. female presenting with chest pain.  Chest Pain Associated symptoms: shortness of breath     Past Medical History  Diagnosis Date  . Asthma   . Palpitations   . Shortness of breath   . Arthritis    Past Surgical History  Procedure Laterality Date  . Abdominal hysterectomy    . Knee surgery    . Coronary angiogram  Nov 2010    Normal coronary arteries   Family History  Problem Relation Age of Onset  . Cancer Sister    History  Substance Use Topics  . Smoking status: Never Smoker   . Smokeless tobacco: Not on file  . Alcohol Use: No   OB History    No data available     Review of Systems  Respiratory: Positive for shortness of breath.   Cardiovascular: Positive for chest pain.  All other systems reviewed and are negative.     Allergies  Eggs or egg-derived products and Hydrocodone  Home Medications   Prior to Admission  medications   Medication Sig Start Date End Date Taking? Authorizing Provider  albuterol (PROVENTIL HFA;VENTOLIN HFA) 108 (90 BASE) MCG/ACT inhaler Inhale 2 puffs into the lungs every 4 (four) hours as needed for wheezing or shortness of breath. 07/16/13  Yes Janece Canterbury, MD  estradiol (VIVELLE-DOT) 0.05 MG/24HR Place 1 patch onto the skin 2 (two) times a week. On Saturday and Wednesday   Yes Historical Provider, MD  Multiple Vitamins-Minerals (MULTIVITAMIN WITH MINERALS) tablet Take 1 tablet by mouth daily.   Yes Historical Provider, MD  hydrochlorothiazide (MICROZIDE) 12.5 MG capsule Take 1 capsule (12.5 mg total) by mouth daily. Patient not taking: Reported on 07/10/2015 07/16/13   Janece Canterbury, MD   BP 132/61 mmHg  Pulse 82  Temp(Src) 98.7 F (37.1 C) (Oral)  Resp 14  SpO2 100% Physical Exam  Constitutional: She is oriented to person, place, and time. She appears well-developed and well-nourished. No distress.  HENT:  Head: Normocephalic and atraumatic.  Right Ear: Hearing normal.  Left Ear: Hearing normal.  Nose: Nose normal.  Mouth/Throat: Oropharynx is clear and moist and mucous membranes are normal.  Eyes: Conjunctivae and EOM are normal. Pupils are equal, round, and reactive to light.  Neck: Normal range of motion. Neck supple.  Cardiovascular: Regular rhythm, S1 normal and S2 normal.  Exam reveals no gallop and no friction rub.   No murmur heard. Pulmonary/Chest: Effort  normal and breath sounds normal. No respiratory distress. She exhibits no tenderness.  Abdominal: Soft. Normal appearance and bowel sounds are normal. There is no hepatosplenomegaly. There is no tenderness. There is no rebound, no guarding, no tenderness at McBurney's point and negative Murphy's sign. No hernia.  Musculoskeletal: Normal range of motion.  Neurological: She is alert and oriented to person, place, and time. She has normal strength. No cranial nerve deficit or sensory deficit. Coordination  normal. GCS eye subscore is 4. GCS verbal subscore is 5. GCS motor subscore is 6.  Skin: Skin is warm, dry and intact. No rash noted. No cyanosis.  Psychiatric: She has a normal mood and affect. Her speech is normal and behavior is normal. Thought content normal.  Nursing note and vitals reviewed.   ED Course  Procedures (including critical care time) Labs Review Labs Reviewed  CBC WITH DIFFERENTIAL/PLATELET - Abnormal; Notable for the following:    RBC 3.66 (*)    Hemoglobin 10.7 (*)    HCT 33.2 (*)    All other components within normal limits  COMPREHENSIVE METABOLIC PANEL - Abnormal; Notable for the following:    Total Bilirubin 0.2 (*)    All other components within normal limits  TROPONIN I  BRAIN NATRIURETIC PEPTIDE  D-DIMER, QUANTITATIVE (NOT AT [facility])  TROPONIN I    Imaging Review Dg Chest 2 View  07/10/2015   CLINICAL DATA:  Right side chest pain for 1 day.  Cough today.  EXAM: CHEST  2 VIEW  COMPARISON:  CT chest and single view of the chest 07/15/2013.  FINDINGS: Heart size and mediastinal contours are within normal limits. Both lungs are clear. Visualized skeletal structures are unremarkable.  IMPRESSION: Negative exam.   Electronically Signed   By: Inge Rise M.D.   On: 07/10/2015 17:42     EKG Interpretation   Date/Time:  Monday July 10 2015 16:30:30 EDT Ventricular Rate:  92 PR Interval:  138 QRS Duration: 124 QT Interval:  392 QTC Calculation: 484 R Axis:   72 Text Interpretation:  Normal sinus rhythm Right bundle branch block  Abnormal ECG No significant change since last tracing Confirmed by POLLINA   MD, [place] [phone]) on 07/10/2015 4:35:15 PM      MDM   Final diagnoses:  Chest pain    Patient presents to the emergency department for evaluation of chest pain. Patient has been having chest pain is felt to be atypical. In the ER. She started with sharp stabbing pains in the left side of her chest 2 days ago and now has been having more  aching pain and shortness of breath. She has noticed increased shortness of breath with exertion. Reviewing her records reveals multiple cardiac workups. She had a heart catheterization in 2010 that showed normal coronary arteries. She had stress testing in 2014 for similar type symptoms and there was no evidence of cardiac etiology noted.  Patient has been observed here in the ER. Vital signs have been stable. No hypoxia or significant wheezing. D-dimer was negative. Troponin negative 2 with an EKG that is unchanged from previous. Patient is felt to be low risk, appropriate for discharge and outpatient follow-up with primary care.    Orpah Greek, MD 07/10/15 2007

## 2015-07-10 NOTE — ED Notes (Signed)
Per EMS: Pt complaining of chest pain since Saturday with some SOB. Sent here by urgent care. Urgent care gave 324 ASA. EMS gave 4 zofran and Nitro x 2. BP 138/84, HR 100.

## 2016-02-13 ENCOUNTER — Other Ambulatory Visit: Payer: Self-pay

## 2016-02-13 DIAGNOSIS — Z1231 Encounter for screening mammogram for malignant neoplasm of breast: Secondary | ICD-10-CM

## 2016-03-20 ENCOUNTER — Ambulatory Visit
Admission: RE | Admit: 2016-03-20 | Discharge: 2016-03-20 | Disposition: A | Discharge status: Home / Self Care | Payer: BLUE CROSS/BLUE SHIELD | Source: Ambulatory Visit

## 2016-03-20 DIAGNOSIS — Z1231 Encounter for screening mammogram for malignant neoplasm of breast: Secondary | ICD-10-CM

## 2016-12-05 DIAGNOSIS — E669 Obesity, unspecified: Secondary | ICD-10-CM | POA: Diagnosis present

## 2016-12-05 DIAGNOSIS — Z6832 Body mass index (BMI) 32.0-32.9, adult: Secondary | ICD-10-CM | POA: Diagnosis present

## 2016-12-05 DIAGNOSIS — E7800 Pure hypercholesterolemia, unspecified: Secondary | ICD-10-CM | POA: Insufficient documentation

## 2017-02-17 ENCOUNTER — Other Ambulatory Visit: Payer: Self-pay | Admitting: Family Medicine

## 2017-02-17 DIAGNOSIS — Z1231 Encounter for screening mammogram for malignant neoplasm of breast: Secondary | ICD-10-CM

## 2017-03-21 ENCOUNTER — Ambulatory Visit
Admission: RE | Admit: 2017-03-21 | Discharge: 2017-03-21 | Disposition: A | Discharge status: Home / Self Care | Payer: BLUE CROSS/BLUE SHIELD | Source: Ambulatory Visit | Attending: Family Medicine | Admitting: Family Medicine

## 2017-03-21 DIAGNOSIS — Z1231 Encounter for screening mammogram for malignant neoplasm of breast: Secondary | ICD-10-CM

## 2017-04-21 ENCOUNTER — Ambulatory Visit (INDEPENDENT_AMBULATORY_CARE_PROVIDER_SITE_OTHER): Payer: BLUE CROSS/BLUE SHIELD | Admitting: Podiatry

## 2017-04-21 ENCOUNTER — Encounter: Payer: Self-pay | Admitting: Podiatry

## 2017-04-21 VITALS — BP 128/86

## 2017-04-21 DIAGNOSIS — B351 Tinea unguium: Secondary | ICD-10-CM | POA: Diagnosis not present

## 2017-04-21 DIAGNOSIS — M79675 Pain in left toe(s): Secondary | ICD-10-CM | POA: Diagnosis not present

## 2017-04-22 NOTE — Progress Notes (Signed)
   Subjective: Patient presents today for pain in the left fifth toenail that has been present for the past 3 months. Wearing shoes exacerbates the pain. She has been soaking the foot in Epsom salt without resolution of the symptoms. Patient presents today for further treatment and evaluation.  Objective: Physical Exam General: The patient is alert and oriented x3 in no acute distress.  Dermatology: Hyperkeratotic, discolored, thickened, onychodystrophy of the right great toe and second toe. Skin is warm, dry and supple bilateral lower extremities. Negative for open lesions or macerations.  Vascular: Palpable pedal pulses bilaterally. No edema or erythema noted. Capillary refill within normal limits.  Neurological: Epicritic and protective threshold grossly intact bilaterally.   Musculoskeletal Exam: Pain with palpation to the left fifth toe. Range of motion within normal limits to all pedal and ankle joints bilateral. Muscle strength 5/5 in all groups bilateral.   Assessment: #1    Plan of Care:  #1 Patient was evaluated. #2 Today nail biopsy was taken and sent to pathology for fungal culture. #3 Mechanical debridement of left fifth toenail performed using a nail nipper. Filed with dremel without incident.  #4 patient is to return to clinic in 4 weeks to discuss fungal culture nail biopsy findings and LFT results and discuss different treatment options.   Edrick Kins, DPM [facility]  Dr. Edrick Kins, [address]                                        [place], [place] [address]                Office [phone]  Fax [phone]

## 2017-05-21 ENCOUNTER — Ambulatory Visit: Payer: BLUE CROSS/BLUE SHIELD | Admitting: Podiatry

## 2017-06-08 ENCOUNTER — Ambulatory Visit (HOSPITAL_COMMUNITY)
Admission: EM | Admit: 2017-06-08 | Discharge: 2017-06-08 | Disposition: A | Discharge status: Home / Self Care | Payer: BLUE CROSS/BLUE SHIELD | Attending: Family Medicine | Admitting: Family Medicine

## 2017-06-08 ENCOUNTER — Encounter (HOSPITAL_COMMUNITY): Payer: Self-pay | Admitting: Emergency Medicine

## 2017-06-08 DIAGNOSIS — S161XXA Strain of muscle, fascia and tendon at neck level, initial encounter: Secondary | ICD-10-CM | POA: Diagnosis not present

## 2017-06-08 MED ORDER — CYCLOBENZAPRINE HCL 10 MG PO TABS: 10.0000 mg | ORAL_TABLET | Freq: Two times a day (BID) | ORAL | 0 refills | Status: DC | PRN
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
## 2018-02-10 ENCOUNTER — Other Ambulatory Visit: Payer: Self-pay | Admitting: Family Medicine

## 2018-02-10 DIAGNOSIS — Z1231 Encounter for screening mammogram for malignant neoplasm of breast: Secondary | ICD-10-CM

## 2018-03-23 ENCOUNTER — Ambulatory Visit: Payer: BLUE CROSS/BLUE SHIELD

## 2018-03-24 ENCOUNTER — Ambulatory Visit
Admission: RE | Admit: 2018-03-24 | Discharge: 2018-03-24 | Disposition: A | Discharge status: Home / Self Care | Payer: BLUE CROSS/BLUE SHIELD | Source: Ambulatory Visit | Attending: Family Medicine | Admitting: Family Medicine

## 2018-03-24 DIAGNOSIS — Z1231 Encounter for screening mammogram for malignant neoplasm of breast: Secondary | ICD-10-CM

## 2018-05-29 ENCOUNTER — Other Ambulatory Visit (HOSPITAL_COMMUNITY): Payer: Self-pay | Admitting: Gastroenterology

## 2018-05-29 DIAGNOSIS — R1310 Dysphagia, unspecified: Secondary | ICD-10-CM

## 2018-06-09 ENCOUNTER — Encounter (HOSPITAL_COMMUNITY)
Admission: RE | Admit: 2018-06-09 | Discharge: 2018-06-09 | Disposition: A | Discharge status: Home / Self Care | Payer: BLUE CROSS/BLUE SHIELD | Source: Ambulatory Visit | Attending: Gastroenterology | Admitting: Gastroenterology

## 2018-06-09 DIAGNOSIS — R1310 Dysphagia, unspecified: Secondary | ICD-10-CM | POA: Diagnosis present

## 2018-06-09 MED ORDER — TECHNETIUM TC 99M SULFUR COLLOID
2.0000 | Freq: Once | INTRAVENOUS | Status: AC | PRN
  Administered 2018-06-09: 2 via ORAL

## 2018-06-29 ENCOUNTER — Ambulatory Visit: Payer: Self-pay | Admitting: Surgery

## 2018-06-29 NOTE — H&P (Signed)
Ellsie Violette Mcconnell Documented: 06/29/2018 3:01 PM Location: [facility] Patient #: [id number] DOB: March 14, 1961 Single / Language: Angela Mcconnell / Race: Black or African American Female  History of Present Illness [name] MD; 06/29/2018 4:27 PM) The patient is a 57 year old female who presents with a hiatal hernia. Note for "Hiatal hernia": ` ` ` Patient sent for surgical consultation at the request of Dr Clarene Essex  Chief Complaint: Worsening heartburn/reflux. Hiatal hernia. ` ` The patient is a pleasant woman that struggle with some weight issues. She's had heartburn for less than a decade. Got to the point of taking Tums and Rolaids several times a day. Worsened. Establish with primary and gastroenterology. Started on Protonix. Now twice a day. It helps somewhat but she still has issues of heartburn. Lately she's been having episodes of food sticking. Occasionally having to spit up foods. Happening more often. Dysphagia to salads and other solid foods especially. Occasionally liquids if she drinks too quickly but not too severe. She is avoiding carbonated fractures since she cannot tolerate those anymore. Followed by Dr. Watt Climes with [facility] gastro[facility]. He did get EGD on her last month. Apparently confirmed hiatal hernia. Looking the CT scan of her chest done 5 years ago for chest pain, I wonder if she had a small one then as well. She had extensive negative cardiac workup for episodes of chest pain. Not consistent with very colic. She does not smoke. She is not diabetic. She had a hysterectomy but no other surgeries. She walks a mile or 2 without difficulty. She's been working aggressively with physical tremor and has intentionally lost about 30 pounds the past year but feels frustrated that she can lose more. I looked and weight loss. Surgery was told she is a little too thin to consider that. She is worried about staying on proton pump inhibitors  indefinitely. She would like to get off of that and consider surgery. Therefore consultation requested  She had episode of chest pain in 2014 with an ink negative workup. Clean cardiac catheterization. Angiography of the chest underwhelming. Small hiatal hernia seems apparent to my view at that time.  (Review of systems as stated in this history (HPI) or in the review of systems. Otherwise all other 12 point ROS are negative) ` ` `   Vitals ([name] CMA; 06/29/2018 3:05 PM) 06/29/2018 3:04 PM Weight: 195.2 lb Height: 63in Body Surface Area: 1.91 m Body Mass Index: 34.58 kg/m  Pulse: 94 (Regular)  BP: 132/80 (Sitting, Left Arm, Standard)      Physical Exam [name] MD; 06/29/2018 3:31 PM)  General Mental Status-Alert. General Appearance-Not in acute distress, Not Sickly. Orientation-Oriented X3. Hydration-Well hydrated. Voice-Normal.  Integumentary Global Assessment Upon inspection and palpation of skin surfaces of the - Axillae: non-tender, no inflammation or ulceration, no drainage. and Distribution of scalp and body hair is normal. General Characteristics Temperature - normal warmth is noted.  Head and Neck Head-normocephalic, atraumatic with no lesions or palpable masses. Face Global Assessment - atraumatic, no absence of expression. Neck Global Assessment - no abnormal movements, no bruit auscultated on the right, no bruit auscultated on the left, no decreased range of motion, non-tender. Trachea-midline. Thyroid Gland Characteristics - non-tender.  Eye Eyeball - Left-Extraocular movements intact, No Nystagmus. Eyeball - Right-Extraocular movements intact, No Nystagmus. Cornea - Left-No Hazy. Cornea - Right-No Hazy. Sclera/Conjunctiva - Left-No scleral icterus, No Discharge. Sclera/Conjunctiva - Right-No scleral icterus, No Discharge. Pupil - Left-Direct reaction to light normal. Pupil -  Right-Direct  reaction to light normal.  ENMT Ears Pinna - Left - no drainage observed, no generalized tenderness observed. Right - no drainage observed, no generalized tenderness observed. Nose and Sinuses External Inspection of the Nose - no destructive lesion observed. Inspection of the nares - Left - quiet respiration. Right - quiet respiration. Mouth and Throat Lips - Upper Lip - no fissures observed, no pallor noted. Lower Lip - no fissures observed, no pallor noted. Nasopharynx - no discharge present. Oral Cavity/Oropharynx - Tongue - no dryness observed. Oral Mucosa - no cyanosis observed. Hypopharynx - no evidence of airway distress observed.  Chest and Lung Exam Inspection Movements - Normal and Symmetrical. Accessory muscles - No use of accessory muscles in breathing. Palpation Palpation of the chest reveals - Non-tender. Auscultation Breath sounds - Normal and Clear.  Cardiovascular Auscultation Rhythm - Regular. Murmurs & Other Heart Sounds - Auscultation of the heart reveals - No Murmurs and No Systolic Clicks.  Abdomen Inspection Inspection of the abdomen reveals - No Visible peristalsis and No Abnormal pulsations. Umbilicus - No Bleeding, No Urine drainage. Palpation/Percussion Palpation and Percussion of the abdomen reveal - Soft, Non Tender, No Rebound tenderness, No Rigidity (guarding) and No Cutaneous hyperesthesia. Note: Abdomen overweight but soft. Not distended. No distasis recti. No umbilical or other anterior abdominal wall hernias  Female Genitourinary Sexual Maturity Tanner 5 - Adult hair pattern. Note: No vaginal bleeding nor discharge  Peripheral Vascular Upper Extremity Inspection - Left - No Cyanotic nailbeds, Not Ischemic. Right - No Cyanotic nailbeds, Not Ischemic.  Neurologic Neurologic evaluation reveals -normal attention span and ability to concentrate, able to name objects and repeat phrases. Appropriate fund of knowledge , normal sensation and  normal coordination. Mental Status Affect - not angry, not paranoid. Cranial Nerves-Normal Bilaterally. Gait-Normal.  Neuropsychiatric Mental status exam performed with findings of-able to articulate well with normal speech/language, rate, volume and coherence, thought content normal with ability to perform basic computations and apply abstract reasoning and no evidence of hallucinations, delusions, obsessions or homicidal/suicidal ideation.  Musculoskeletal Global Assessment Spine, Ribs and Pelvis - no instability, subluxation or laxity. Right Upper Extremity - no instability, subluxation or laxity.  Lymphatic Head & Neck  General Head & Neck Lymphatics: Bilateral - Description - No Localized lymphadenopathy. Axillary  General Axillary Region: Bilateral - Description - No Localized lymphadenopathy. Femoral & Inguinal  Generalized Femoral & Inguinal Lymphatics: Left - Description - No Localized lymphadenopathy. Right - Description - No Localized lymphadenopathy.    Assessment & Plan [name] MD; 06/29/2018 3:31 PM)  PARAESOPHAGEAL HIATAL HERNIA (K44.9) Impression: Patient with known hiatal hernia based on EGD and suspected on CT scan 5 years ago. Worsening episodes of dysphagia heartburn and reflux despite an antacid medication and gastroenterology supervision. She strongly wishes to avoid being on proton pump inhibitors indefinitely since she suspects friends have had issues with that after being on it for decades.  Dysphagia seems to be happening more often and worsening. EGD done last month without any endoluminal masses. Would like to actually look at the report myself.  Gastric emptying study shows normal emptying, arguing against a primary gastric disorder.  She is are had cardiac workup ruling out any cardiac issues. Good exercise tolerance. No worsening pulmonary symptoms. No hepatopancreatic/ biliary symptoms.  I would like to get esophageal manometry to  rule out any primary esophageal disorder. See if she can tolerate complete fundoplication given her relatively young age.  I instincts is that she would benefit  from a hiatal hernia repair. Minimally invasive approach. Daily vitamin his robotically. Probably use absorbable mesh reinforcement to decrease recurrence. Fundoplication. Manometry quelled me tailor whether I can do a complete versus partial fundoplication.  Another option is to consider weight loss bariatric surgery. She didn't do that and was told that she is not a candidate for that since her BMI is less than 35 and she has no other comorbidities.. She's intentionally lost 30 pounds of weight and would like to hold off on that if possible. Don't know how durable the situation be a hiatal hernia if it is actually large.  Current Plans You are being scheduled for surgery- Our schedulers will call you.  You should hear from our office's scheduling department within 5 working days about the location, date, and time of surgery. We try to make accommodations for patient's preferences in scheduling surgery, but sometimes the OR schedule or the surgeon's schedule prevents [place] from making those accommodations.  If you have not heard from our office [phone]) in 5 working days, call the office and ask for your surgeon's nurse.  If you have other questions about your diagnosis, plan, or surgery, call the office and ask for your surgeon's nurse.  Follow Up - Call [facility] office after tests / studies doneto discuss further plans Pt Education - [facility] Esophageal Surgery Diet HCI (Sylvan Lahm): discussed with patient and provided information. Pt Education - [facility] Laparoscopic Surgery HCI The anatomy & physiology of the foregut and anti-reflux mechanism was discussed. The pathophysiology of hiatal herniation and GERD was discussed. Natural history risks without surgery was discussed. The patient's symptoms are not adequately controlled by medicines and  other non-operative treatments. I feel the risks of no intervention will lead to serious problems that outweigh the operative risks; therefore, I recommended surgery to reduce the hiatal hernia out of the chest and fundoplication to rebuild the anti-reflux valve and control reflux better. Need for a thorough workup to rule out the differential diagnosis and plan treatment was explained. I explained laparoscopic techniques with possible need for an open approach.  Risks such as bleeding, infection, abscess, leak, need for further treatment, heart attack, death, and other risks were discussed. I noted a good likelihood this will help address the problem. Goals of post-operative recovery were discussed as well. Possibility that this will not correct all symptoms was explained. Post-operative dysphagia, need for short-term liquid & pureed diet, inability to vomit, possibility of reherniation, possible need for medicines to help control symptoms in addition to surgery were discussed. We will work to minimize complications. Educational handouts further explaining the pathology, treatment options, and dysphagia diet was given as well. Questions were answered. The patient expresses understanding & wishes to proceed with surgery.  [name], MD, FACS, MASCRS Gastrointestinal and Minimally Invasive Surgery    [address], [place], [place] [address] [phone] Main / Paging [phone] Fax

## 2018-07-31 ENCOUNTER — Other Ambulatory Visit: Payer: Self-pay | Admitting: Gastroenterology

## 2018-08-12 ENCOUNTER — Encounter (HOSPITAL_COMMUNITY)
Admission: RE | Disposition: A | Discharge status: Home / Self Care | Payer: Self-pay | Source: Ambulatory Visit | Attending: Gastroenterology

## 2018-08-12 ENCOUNTER — Ambulatory Visit (HOSPITAL_COMMUNITY)
Admission: RE | Admit: 2018-08-12 | Discharge: 2018-08-12 | Disposition: A | Discharge status: Home / Self Care | Payer: BLUE CROSS/BLUE SHIELD | Source: Ambulatory Visit | Attending: Gastroenterology | Admitting: Gastroenterology

## 2018-08-12 DIAGNOSIS — R1310 Dysphagia, unspecified: Secondary | ICD-10-CM | POA: Diagnosis present

## 2018-08-12 HISTORY — PX: ESOPHAGEAL MANOMETRY: SHX5429

## 2018-08-12 SURGERY — MANOMETRY, ESOPHAGUS

## 2018-08-12 MED ORDER — LIDOCAINE VISCOUS HCL 2 % MT SOLN
OROMUCOSAL | Status: AC
  Filled 2018-08-12: qty 15

## 2018-08-12 SURGICAL SUPPLY — 2 items
FACESHIELD LNG OPTICON STERILE (SAFETY) IMPLANT
GLOVE BIO SURGEON STRL SZ8 (GLOVE) ×4 IMPLANT

## 2018-08-12 NOTE — Progress Notes (Signed)
Esophageal Manometry done per protocol. Pt tolerated well without distress or complication.  

## 2018-08-13 ENCOUNTER — Encounter (HOSPITAL_COMMUNITY): Payer: Self-pay | Admitting: Gastroenterology

## 2018-09-30 ENCOUNTER — Other Ambulatory Visit: Payer: Self-pay

## 2018-09-30 ENCOUNTER — Encounter (HOSPITAL_COMMUNITY): Payer: Self-pay

## 2018-09-30 ENCOUNTER — Ambulatory Visit (HOSPITAL_COMMUNITY)
Admission: EM | Admit: 2018-09-30 | Discharge: 2018-09-30 | Disposition: A | Discharge status: Home / Self Care | Payer: BLUE CROSS/BLUE SHIELD | Attending: Family Medicine | Admitting: Family Medicine

## 2018-09-30 DIAGNOSIS — M25512 Pain in left shoulder: Secondary | ICD-10-CM | POA: Diagnosis not present

## 2018-09-30 MED ORDER — CYCLOBENZAPRINE HCL 10 MG PO TABS: 10.0000 mg | ORAL_TABLET | Freq: Two times a day (BID) | ORAL | 0 refills | Status: DC | PRN

## 2018-09-30 MED ORDER — NAPROXEN 500 MG PO TABS: 500.0000 mg | ORAL_TABLET | Freq: Two times a day (BID) | ORAL | 0 refills | Status: DC

## 2018-09-30 NOTE — ED Triage Notes (Signed)
Pt c/o left shoulder pain. Pt state she was trying to lift weight at the gym.

## 2018-10-01 NOTE — ED Provider Notes (Signed)
[name]   [id number] 09/30/18 Arrival Time: 7322  ASSESSMENT & PLAN:  1. Acute pain of left shoulder    Impingement-like symptoms.  Meds ordered this encounter  Medications  . cyclobenzaprine (FLEXERIL) 10 MG tablet    Sig: Take 1 tablet (10 mg total) by mouth 2 (two) times daily as needed for muscle spasms.    Dispense:  20 tablet    Refill:  0  . naproxen (NAPROSYN) 500 MG tablet    Sig: Take 1 tablet (500 mg total) by mouth 2 (two) times daily with a meal.    Dispense:  20 tablet    Refill:  0   Encourage ROM as she tolerates.  Follow-up Information    Angela Ruff, MD.   Specialty:  Family Medicine Why:  As needed. Contact information: [place] [address] [phone]          Will plan to see her PCP if not improving over the next week.  Reviewed expectations re: course of current medical issues. Questions answered. Outlined signs and symptoms indicating need for more acute intervention. Patient verbalized understanding. After Visit Summary given.  SUBJECTIVE: History from: patient. Angela Mcconnell is a 57 y.o. female who reports fairly persistent mild to moderate pain of her left shoulder; described as aching without radiation. Injury/trama: yes, no trauma; reports abrupt onset of pain while lifting weight at gym today. Symptoms have stabilized since beginning. Relieved by: partially by not moving L shoulder. Worsened by: certain movements of LUE. Associated symptoms: none reported. Extremity sensation changes or weakness: none. Self treatment: has not tried OTCs for relief of pain. History of similar: no.  Past Surgical History:  Procedure Laterality Date  . ABDOMINAL HYSTERECTOMY    . CORONARY ANGIOGRAM  Nov 2010   Normal coronary arteries  . ESOPHAGEAL MANOMETRY N/A 08/12/2018   Procedure: ESOPHAGEAL MANOMETRY (EM);  Surgeon: Angela Essex, MD;  Location: [facility];  Service: Endoscopy;  Laterality:  N/A;  . KNEE SURGERY       ROS: As per HPI. All other systems negative.    OBJECTIVE:  Vitals:   09/30/18 1643 09/30/18 1645  BP:  (!) 143/92  Resp:  18  Temp:  97.6 F (36.4 C)  TempSrc:  Oral  SpO2:  98%  Weight: 82.6 kg     General appearance: alert; no distress Extremities: . LUE: warm and well perfused; poorly localized moderate tenderness over left superior shoulder; without gross deformities; with no swelling; with no bruising; ROM: normal but reports pain with abduction; some pain with internal rotation CV: brisk extremity capillary refill of LUE; 2+ radial pulse of LUE  Lungs: CTAB; unlabored Skin: warm and dry; no visible rashes Neurologic: normal reflexes of LUE; normal sensation of LUE; normal strength of LUE Psychological: alert and cooperative; normal mood and affect  Allergies  Allergen Reactions  . Sulfa Antibiotics Other (See Comments)    Surgical   . Tape Itching  . Eggs Or Egg-Derived Products     Hives, mouth and throat itching  . Hydrocodone     itching    Past Medical History:  Diagnosis Date  . Arthritis   . Asthma   . Palpitations   . Shortness of breath    Social History   Socioeconomic History  . Marital status: Significant Other    Spouse name: Not on file  . Number of children: Not on file  . Years of education: Not on file  . Highest  education level: Not on file  Occupational History  . Not on file  Social Needs  . Financial resource strain: Not on file  . Food insecurity:    Worry: Not on file    Inability: Not on file  . Transportation needs:    Medical: Not on file    Non-medical: Not on file  Tobacco Use  . Smoking status: Never Smoker  . Smokeless tobacco: Never Used  Substance and Sexual Activity  . Alcohol use: No  . Drug use: No  . Sexual activity: Not on file  Lifestyle  . Physical activity:    Days per week: Not on file    Minutes per session: Not on file  . Stress: Not on file  Relationships  . Social  connections:    Talks on phone: Not on file    Gets together: Not on file    Attends religious service: Not on file    Active member of club or organization: Not on file    Attends meetings of clubs or organizations: Not on file    Relationship status: Not on file  Other Topics Concern  . Not on file  Social History Narrative  . Not on file   Family History  Problem Relation Age of Onset  . Cancer Sister    Past Surgical History:  Procedure Laterality Date  . ABDOMINAL HYSTERECTOMY    . CORONARY ANGIOGRAM  Nov 2010   Normal coronary arteries  . ESOPHAGEAL MANOMETRY N/A 08/12/2018   Procedure: ESOPHAGEAL MANOMETRY (EM);  Surgeon: Angela Essex, MD;  Location: [facility];  Service: Endoscopy;  Laterality: N/A;  . KNEE SURGERY        Angela Kick, MD 10/01/18 [phone]

## 2018-10-07 NOTE — Patient Instructions (Signed)
Angela Mcconnell  10/07/2018   Your procedure is scheduled on: 10-14-18    Report to [facility] Main  Entrance    Report to Admitting at 8:30 AM    Call this number if you have problems the morning of surgery [phone]     Remember: Do not eat food or drink liquids :After Midnight.     Take these medicines the morning of surgery with A SIP OF WATER: Pantoprazole (Protonix)   BRUSH YOUR TEETH MORNING OF SURGERY AND RINSE YOUR MOUTH OUT, NO CHEWING GUM CANDY OR MINTS.                              You may not have any metal on your body including hair pins and              piercings  Do not wear jewelry, make-up, lotions, powders or perfumes, deodorant             Do not wear nail polish.  Do not shave  48 hours prior to surgery.                Do not bring valuables to the hospital. [place].  Contacts, dentures or bridgework may not be worn into surgery.  Leave suitcase in the car. After surgery it may be brought to your room.    Special Instructions: N/A              Please read over the following fact sheets you were given: _____________________________________________________________________          [facility] - Preparing for Surgery Before surgery, you can play an important role.  Because skin is not sterile, your skin needs to be as free of germs as possible.  You can reduce the number of germs on your skin by washing with CHG (chlorahexidine gluconate) soap before surgery.  CHG is an antiseptic cleaner which kills germs and bonds with the skin to continue killing germs even after washing. Please DO NOT use if you have an allergy to CHG or antibacterial soaps.  If your skin becomes reddened/irritated stop using the CHG and inform your nurse when you arrive at Short Stay. Do not shave (including legs and underarms) for at least 48 hours prior to the first CHG shower.  You may shave your  face/neck. Please follow these instructions carefully:  1.  Shower with CHG Soap the night before surgery and the  morning of Surgery.  2.  If you choose to wash your hair, wash your hair first as usual with your  normal  shampoo.  3.  After you shampoo, rinse your hair and body thoroughly to remove the  shampoo.                           4.  Use CHG as you would any other liquid soap.  You can apply chg directly  to the skin and wash                       Gently with a scrungie or clean washcloth.  5.  Apply the CHG Soap to your body ONLY FROM THE NECK DOWN.  Do not use on face/ open                           Wound or open sores. Avoid contact with eyes, ears mouth and genitals (private parts).                       Wash face,  Genitals (private parts) with your normal soap.             6.  Wash thoroughly, paying special attention to the area where your surgery  will be performed.  7.  Thoroughly rinse your body with warm water from the neck down.  8.  DO NOT shower/wash with your normal soap after using and rinsing off  the CHG Soap.                9.  Pat yourself dry with a clean towel.            10.  Wear clean pajamas.            11.  Place clean sheets on your bed the night of your first shower and do not  sleep with pets. Day of Surgery : Do not apply any lotions/deodorants the morning of surgery.  Please wear clean clothes to the hospital/surgery center.  FAILURE TO FOLLOW THESE INSTRUCTIONS MAY RESULT IN THE CANCELLATION OF YOUR SURGERY PATIENT SIGNATURE_________________________________  NURSE SIGNATURE__________________________________  ________________________________________________________________________

## 2018-10-09 ENCOUNTER — Encounter (HOSPITAL_COMMUNITY): Payer: Self-pay

## 2018-10-09 ENCOUNTER — Other Ambulatory Visit: Payer: Self-pay

## 2018-10-09 ENCOUNTER — Encounter (HOSPITAL_COMMUNITY)
Admission: RE | Admit: 2018-10-09 | Discharge: 2018-10-09 | Disposition: A | Discharge status: Home / Self Care | Payer: BLUE CROSS/BLUE SHIELD | Source: Ambulatory Visit | Attending: Surgery | Admitting: Surgery

## 2018-10-09 DIAGNOSIS — I10 Essential (primary) hypertension: Secondary | ICD-10-CM | POA: Insufficient documentation

## 2018-10-09 DIAGNOSIS — Z01818 Encounter for other preprocedural examination: Secondary | ICD-10-CM | POA: Diagnosis present

## 2018-10-09 LAB — CBC
HCT: 37.5 % (ref 36.0–46.0)
Hemoglobin: 11.5 g/dL — ABNORMAL LOW (ref 12.0–15.0)
MCH: 29 pg (ref 26.0–34.0)
MCHC: 30.7 g/dL (ref 30.0–36.0)
MCV: 94.7 fL (ref 80.0–100.0)
Platelets: 336 10*3/uL (ref 150–400)
RBC: 3.96 MIL/uL (ref 3.87–5.11)
RDW: 14.7 % (ref 11.5–15.5)
WBC: 7.9 10*3/uL (ref 4.0–10.5)
nRBC: 0 % (ref 0.0–0.2)

## 2018-10-09 LAB — BASIC METABOLIC PANEL
Anion gap: 8 (ref 5–15)
BUN: 15 mg/dL (ref 6–20)
CO2: 26 mmol/L (ref 22–32)
Calcium: 9.2 mg/dL (ref 8.9–10.3)
Chloride: 107 mmol/L (ref 98–111)
Creatinine, Ser: 0.98 mg/dL (ref 0.44–1.00)
GFR calc Af Amer: >60 mL/min (ref >60)
GFR calc non Af Amer: >60 mL/min (ref >60)
Glucose, Bld: 86 mg/dL (ref 70–99)
Potassium: 4.1 mmol/L (ref 3.5–5.1)
Sodium: 141 mmol/L (ref 135–145)

## 2018-10-13 NOTE — Anesthesia Preprocedure Evaluation (Addendum)
Anesthesia Evaluation  Patient identified by MRN, date of birth, ID band Patient awake    Reviewed: Allergy & Precautions, NPO status , Patient's Chart, lab work & pertinent test results  Airway Mallampati: II  TM Distance: >3 FB Neck ROM: Full    Dental no notable dental hx. (+) Teeth Intact, Dental Advisory Given   Pulmonary asthma ,    Pulmonary exam normal breath sounds clear to auscultation       Cardiovascular negative cardio ROS Normal cardiovascular exam+ dysrhythmias Atrial Fibrillation  Rhythm:Regular Rate:Normal  Echo 8/14 Left ventricle: The cavity size was normal. Wall thickness was increased in a pattern of mild LVH. There was mild concentric hypertrophy. Systolic function was normal. The estimated ejection fraction was in the range of 60% to 65%. Left ventricular diastolic function parameters were normal.      Transthoracic echocardiography. M-mode, complete 2D, spectral Doppler, and color Doppler. Height: Height: 160cm. Height: 63in. Weight: Weight: 81.4kg. Weight: 179lb. Body mass index: BMI: 31.8kg/m^2. Body surface area:  BSA: 1.45m^2. Blood pressure:   114/58. Patient status: Inpatient. Location: Bedside.  EKG SR w RBBB   Neuro/Psych negative neurological ROS  negative psych ROS   GI/Hepatic Neg liver ROS, hiatal hernia,   Endo/Other  negative endocrine ROS  Renal/GU negative Renal ROS     Musculoskeletal  (+) Arthritis ,   Abdominal (+) + obese,   Peds  Hematology  (+) Blood dyscrasia, anemia ,   Anesthesia Other Findings   Reproductive/Obstetrics                            Lab Results  Component Value Date   CREATININE 0.98 10/09/2018   BUN 15 10/09/2018   NA 141 10/09/2018   K 4.1 10/09/2018   CL 107 10/09/2018   CO2 26 10/09/2018    Lab Results  Component Value Date   WBC 7.9 10/09/2018   HGB 11.5 (L) 10/09/2018   HCT 37.5  10/09/2018   MCV 94.7 10/09/2018   PLT 336 10/09/2018    Anesthesia Physical Anesthesia Plan  ASA: II  Anesthesia Plan: General   Post-op Pain Management:    Induction: Intravenous  PONV Risk Score and Plan: 3 and Treatment may vary due to age or medical condition, Ondansetron, Dexamethasone, Midazolam and Scopolamine patch - Pre-op  Airway Management Planned: Oral ETT  Additional Equipment:   Intra-op Plan:   Post-operative Plan: Extubation in OR  Informed Consent: I have reviewed the patients History and Physical, chart, labs and discussed the procedure including the risks, benefits and alternatives for the proposed anesthesia with the patient or authorized representative who has indicated his/her understanding and acceptance.   Dental advisory given  Plan Discussed with:   Anesthesia Plan Comments: (Lidocaine drip ketamine)       Anesthesia Quick Evaluation

## 2018-10-14 ENCOUNTER — Inpatient Hospital Stay (HOSPITAL_COMMUNITY)
Admission: RE | Admit: 2018-10-14 | Discharge: 2018-10-18 | DRG: 327 | Disposition: A | Discharge status: Home / Self Care | Payer: BLUE CROSS/BLUE SHIELD | Source: Ambulatory Visit | Attending: Surgery | Admitting: Surgery

## 2018-10-14 ENCOUNTER — Ambulatory Visit (HOSPITAL_COMMUNITY): Payer: BLUE CROSS/BLUE SHIELD | Admitting: Anesthesiology

## 2018-10-14 ENCOUNTER — Encounter (HOSPITAL_COMMUNITY): Payer: Self-pay

## 2018-10-14 ENCOUNTER — Other Ambulatory Visit: Payer: Self-pay

## 2018-10-14 ENCOUNTER — Encounter (HOSPITAL_COMMUNITY)
Admission: RE | Disposition: A | Discharge status: Home / Self Care | Payer: Self-pay | Source: Ambulatory Visit | Attending: Surgery

## 2018-10-14 DIAGNOSIS — Z79899 Other long term (current) drug therapy: Secondary | ICD-10-CM

## 2018-10-14 DIAGNOSIS — K449 Diaphragmatic hernia without obstruction or gangrene: Principal | ICD-10-CM | POA: Diagnosis present

## 2018-10-14 DIAGNOSIS — J45909 Unspecified asthma, uncomplicated: Secondary | ICD-10-CM | POA: Diagnosis present

## 2018-10-14 DIAGNOSIS — Z9889 Other specified postprocedural states: Secondary | ICD-10-CM

## 2018-10-14 DIAGNOSIS — K2100 Gastro-esophageal reflux disease with esophagitis, without bleeding: Secondary | ICD-10-CM

## 2018-10-14 DIAGNOSIS — D62 Acute posthemorrhagic anemia: Secondary | ICD-10-CM | POA: Diagnosis not present

## 2018-10-14 DIAGNOSIS — Z6832 Body mass index (BMI) 32.0-32.9, adult: Secondary | ICD-10-CM | POA: Diagnosis present

## 2018-10-14 DIAGNOSIS — E669 Obesity, unspecified: Secondary | ICD-10-CM | POA: Diagnosis present

## 2018-10-14 DIAGNOSIS — Z6834 Body mass index (BMI) 34.0-34.9, adult: Secondary | ICD-10-CM

## 2018-10-14 DIAGNOSIS — R1310 Dysphagia, unspecified: Secondary | ICD-10-CM | POA: Diagnosis present

## 2018-10-14 DIAGNOSIS — R079 Chest pain, unspecified: Secondary | ICD-10-CM | POA: Diagnosis present

## 2018-10-14 HISTORY — PX: INSERTION OF MESH: SHX5868

## 2018-10-14 SURGERY — FUNDOPLICATION, NISSEN, ROBOT-ASSISTED, LAPAROSCOPIC
Anesthesia: General | Site: Abdomen

## 2018-10-14 MED ORDER — GABAPENTIN 300 MG PO CAPS
300.0000 mg | ORAL_CAPSULE | ORAL | Status: AC
  Administered 2018-10-14: 300 mg via ORAL
  Filled 2018-10-14: qty 1

## 2018-10-14 MED ORDER — GABAPENTIN 300 MG PO CAPS
300.0000 mg | ORAL_CAPSULE | Freq: Two times a day (BID) | ORAL | Status: DC
  Administered 2018-10-14 – 2018-10-17 (×7): 300 mg via ORAL
  Filled 2018-10-14 (×7): qty 1

## 2018-10-14 MED ORDER — FENTANYL CITRATE (PF) 100 MCG/2ML IJ SOLN
INTRAMUSCULAR | Status: AC
  Filled 2018-10-14: qty 4

## 2018-10-14 MED ORDER — ROCURONIUM BROMIDE 10 MG/ML (PF) SYRINGE
PREFILLED_SYRINGE | INTRAVENOUS | Status: DC | PRN
  Administered 2018-10-14: 20 mg via INTRAVENOUS
  Administered 2018-10-14: 50 mg via INTRAVENOUS

## 2018-10-14 MED ORDER — HYDROMORPHONE HCL 1 MG/ML IJ SOLN
0.5000 mg | INTRAMUSCULAR | Status: DC | PRN
  Administered 2018-10-14 – 2018-10-17 (×3): 1 mg via INTRAVENOUS
  Filled 2018-10-14 (×3): qty 1

## 2018-10-14 MED ORDER — BUPIVACAINE-EPINEPHRINE (PF) 0.25% -1:200000 IJ SOLN
INTRAMUSCULAR | Status: AC
  Filled 2018-10-14: qty 60

## 2018-10-14 MED ORDER — DIPHENHYDRAMINE HCL 12.5 MG/5ML PO ELIX: 12.5000 mg | ORAL_SOLUTION | Freq: Four times a day (QID) | ORAL | Status: DC | PRN

## 2018-10-14 MED ORDER — PROCHLORPERAZINE MALEATE 10 MG PO TABS: 10.0000 mg | ORAL_TABLET | Freq: Four times a day (QID) | ORAL | Status: DC | PRN

## 2018-10-14 MED ORDER — METOCLOPRAMIDE HCL 5 MG/ML IJ SOLN: 5.0000 mg | Freq: Three times a day (TID) | INTRAMUSCULAR | Status: DC | PRN

## 2018-10-14 MED ORDER — SUGAMMADEX SODIUM 500 MG/5ML IV SOLN
INTRAVENOUS | Status: AC
  Filled 2018-10-14: qty 5

## 2018-10-14 MED ORDER — KETOROLAC TROMETHAMINE 30 MG/ML IJ SOLN
INTRAMUSCULAR | Status: AC
  Filled 2018-10-14: qty 1

## 2018-10-14 MED ORDER — MEPERIDINE HCL 50 MG/ML IJ SOLN: 6.2500 mg | INTRAMUSCULAR | Status: DC | PRN

## 2018-10-14 MED ORDER — FENTANYL CITRATE (PF) 250 MCG/5ML IJ SOLN
INTRAMUSCULAR | Status: AC
  Filled 2018-10-14: qty 5

## 2018-10-14 MED ORDER — CLOBETASOL PROPIONATE 0.05 % EX CREA: 1.0000 "application " | TOPICAL_CREAM | Freq: Every day | CUTANEOUS | Status: DC | PRN

## 2018-10-14 MED ORDER — ONDANSETRON HCL 4 MG/2ML IJ SOLN: 4.0000 mg | Freq: Once | INTRAMUSCULAR | Status: DC | PRN

## 2018-10-14 MED ORDER — DIPHENHYDRAMINE HCL 50 MG/ML IJ SOLN: 12.5000 mg | Freq: Four times a day (QID) | INTRAMUSCULAR | Status: DC | PRN

## 2018-10-14 MED ORDER — SODIUM CHLORIDE 0.9 % IV SOLN
2.0000 g | INTRAVENOUS | Status: AC
  Administered 2018-10-14: 2 g via INTRAVENOUS
  Filled 2018-10-14: qty 20

## 2018-10-14 MED ORDER — ONDANSETRON 4 MG PO TBDP: 4.0000 mg | ORAL_TABLET | Freq: Four times a day (QID) | ORAL | Status: DC | PRN

## 2018-10-14 MED ORDER — MIDAZOLAM HCL 2 MG/2ML IJ SOLN
INTRAMUSCULAR | Status: AC
  Filled 2018-10-14: qty 2

## 2018-10-14 MED ORDER — PHENYLEPHRINE 40 MCG/ML (10ML) SYRINGE FOR IV PUSH (FOR BLOOD PRESSURE SUPPORT)
PREFILLED_SYRINGE | INTRAVENOUS | Status: AC
  Filled 2018-10-14: qty 10

## 2018-10-14 MED ORDER — PANTOPRAZOLE SODIUM 40 MG PO TBEC
40.0000 mg | DELAYED_RELEASE_TABLET | Freq: Every day | ORAL | Status: DC
  Administered 2018-10-14 – 2018-10-17 (×4): 40 mg via ORAL
  Filled 2018-10-14 (×4): qty 1

## 2018-10-14 MED ORDER — FENTANYL CITRATE (PF) 100 MCG/2ML IJ SOLN
25.0000 ug | INTRAMUSCULAR | Status: DC | PRN
  Administered 2018-10-14 (×3): 50 ug via INTRAVENOUS

## 2018-10-14 MED ORDER — KETAMINE HCL 10 MG/ML IJ SOLN
INTRAMUSCULAR | Status: DC | PRN
  Administered 2018-10-14: 20 mg via INTRAVENOUS
  Administered 2018-10-14: 10 mg via INTRAVENOUS
  Administered 2018-10-14: 20 mg via INTRAVENOUS

## 2018-10-14 MED ORDER — LIDOCAINE 2% (20 MG/ML) 5 ML SYRINGE
INTRAMUSCULAR | Status: DC | PRN
  Administered 2018-10-14: 1.5 mg/kg/h via INTRAVENOUS

## 2018-10-14 MED ORDER — FENTANYL CITRATE (PF) 250 MCG/5ML IJ SOLN
INTRAMUSCULAR | Status: DC | PRN
  Administered 2018-10-14 (×2): 100 ug via INTRAVENOUS
  Administered 2018-10-14 (×2): 50 ug via INTRAVENOUS

## 2018-10-14 MED ORDER — ONDANSETRON HCL 4 MG/2ML IJ SOLN
INTRAMUSCULAR | Status: DC | PRN
  Administered 2018-10-14: 4 mg via INTRAVENOUS

## 2018-10-14 MED ORDER — SODIUM CHLORIDE 0.9 % IV SOLN
INTRAVENOUS | Status: DC
  Administered 2018-10-14 – 2018-10-15 (×2): via INTRAVENOUS

## 2018-10-14 MED ORDER — SIMETHICONE 80 MG PO CHEW
40.0000 mg | CHEWABLE_TABLET | Freq: Four times a day (QID) | ORAL | Status: DC | PRN
  Administered 2018-10-14: 40 mg via ORAL
  Filled 2018-10-14: qty 1

## 2018-10-14 MED ORDER — BUPIVACAINE LIPOSOME 1.3 % IJ SUSP
20.0000 mL | Freq: Once | INTRAMUSCULAR | Status: AC
  Administered 2018-10-14: 20 mL
  Filled 2018-10-14: qty 20

## 2018-10-14 MED ORDER — ONDANSETRON HCL 4 MG/2ML IJ SOLN
INTRAMUSCULAR | Status: AC
  Filled 2018-10-14: qty 2

## 2018-10-14 MED ORDER — ONDANSETRON HCL 4 MG/2ML IJ SOLN
4.0000 mg | Freq: Four times a day (QID) | INTRAMUSCULAR | Status: DC | PRN
  Administered 2018-10-14 – 2018-10-15 (×3): 4 mg via INTRAVENOUS
  Filled 2018-10-14 (×3): qty 2

## 2018-10-14 MED ORDER — ACETAMINOPHEN 500 MG PO TABS
1000.0000 mg | ORAL_TABLET | ORAL | Status: AC
  Administered 2018-10-14: 1000 mg via ORAL
  Filled 2018-10-14: qty 2

## 2018-10-14 MED ORDER — LACTATED RINGERS IV SOLN: 1000.0000 mL | Freq: Three times a day (TID) | INTRAVENOUS | Status: AC | PRN

## 2018-10-14 MED ORDER — ZOLPIDEM TARTRATE 5 MG PO TABS: 5.0000 mg | ORAL_TABLET | Freq: Every evening | ORAL | Status: DC | PRN

## 2018-10-14 MED ORDER — DEXAMETHASONE SODIUM PHOSPHATE 10 MG/ML IJ SOLN
INTRAMUSCULAR | Status: AC
  Filled 2018-10-14: qty 1

## 2018-10-14 MED ORDER — PROPOFOL 10 MG/ML IV BOLUS
INTRAVENOUS | Status: AC
  Filled 2018-10-14: qty 20

## 2018-10-14 MED ORDER — PROPOFOL 10 MG/ML IV BOLUS
INTRAVENOUS | Status: DC | PRN
  Administered 2018-10-14: 150 mg via INTRAVENOUS

## 2018-10-14 MED ORDER — METRONIDAZOLE IN NACL 5-0.79 MG/ML-% IV SOLN
500.0000 mg | Freq: Once | INTRAVENOUS | Status: AC
  Administered 2018-10-14: 500 mg via INTRAVENOUS
  Filled 2018-10-14: qty 100

## 2018-10-14 MED ORDER — OXYCODONE HCL 5 MG PO TABS
5.0000 mg | ORAL_TABLET | ORAL | Status: DC | PRN
  Administered 2018-10-14: 5 mg via ORAL
  Administered 2018-10-15 (×2): 10 mg via ORAL
  Filled 2018-10-14: qty 1
  Filled 2018-10-14 (×3): qty 2

## 2018-10-14 MED ORDER — PROCHLORPERAZINE EDISYLATE 10 MG/2ML IJ SOLN: 5.0000 mg | Freq: Four times a day (QID) | INTRAMUSCULAR | Status: DC | PRN

## 2018-10-14 MED ORDER — BISACODYL 10 MG RE SUPP: 10.0000 mg | Freq: Two times a day (BID) | RECTAL | Status: DC | PRN

## 2018-10-14 MED ORDER — METHOCARBAMOL 500 MG PO TABS: 750.0000 mg | ORAL_TABLET | Freq: Four times a day (QID) | ORAL | Status: DC | PRN

## 2018-10-14 MED ORDER — ROCURONIUM BROMIDE 10 MG/ML (PF) SYRINGE
PREFILLED_SYRINGE | INTRAVENOUS | Status: AC
  Filled 2018-10-14: qty 10

## 2018-10-14 MED ORDER — DEXAMETHASONE SODIUM PHOSPHATE 10 MG/ML IJ SOLN
INTRAMUSCULAR | Status: DC | PRN
  Administered 2018-10-14: 10 mg via INTRAVENOUS

## 2018-10-14 MED ORDER — 0.9 % SODIUM CHLORIDE (POUR BTL) OPTIME
TOPICAL | Status: DC | PRN
  Administered 2018-10-14: 2000 mL

## 2018-10-14 MED ORDER — LIP MEDEX EX OINT
1.0000 "application " | TOPICAL_OINTMENT | Freq: Two times a day (BID) | CUTANEOUS | Status: DC
  Administered 2018-10-14 – 2018-10-17 (×7): 1 via TOPICAL
  Filled 2018-10-14 (×3): qty 7

## 2018-10-14 MED ORDER — MAGIC MOUTHWASH
15.0000 mL | Freq: Four times a day (QID) | ORAL | Status: DC | PRN
  Filled 2018-10-14: qty 15

## 2018-10-14 MED ORDER — KETOROLAC TROMETHAMINE 30 MG/ML IJ SOLN
30.0000 mg | Freq: Once | INTRAMUSCULAR | Status: AC | PRN
  Administered 2018-10-14: 30 mg via INTRAVENOUS

## 2018-10-14 MED ORDER — CYCLOBENZAPRINE HCL 10 MG PO TABS
10.0000 mg | ORAL_TABLET | Freq: Every day | ORAL | Status: DC
  Administered 2018-10-14 – 2018-10-17 (×4): 10 mg via ORAL
  Filled 2018-10-14 (×4): qty 1

## 2018-10-14 MED ORDER — LACTATED RINGERS IR SOLN
Status: DC | PRN
  Administered 2018-10-14: 1000 mL

## 2018-10-14 MED ORDER — LACTATED RINGERS IV SOLN
INTRAVENOUS | Status: DC
  Administered 2018-10-14 (×2): via INTRAVENOUS

## 2018-10-14 MED ORDER — SUGAMMADEX SODIUM 200 MG/2ML IV SOLN
INTRAVENOUS | Status: DC | PRN
  Administered 2018-10-14: 300 mg via INTRAVENOUS

## 2018-10-14 MED ORDER — METOPROLOL TARTRATE 5 MG/5ML IV SOLN: 5.0000 mg | Freq: Four times a day (QID) | INTRAVENOUS | Status: DC | PRN

## 2018-10-14 MED ORDER — LIDOCAINE 2% (20 MG/ML) 5 ML SYRINGE
INTRAMUSCULAR | Status: AC
  Filled 2018-10-14: qty 5

## 2018-10-14 MED ORDER — ACETAMINOPHEN 500 MG PO TABS
1000.0000 mg | ORAL_TABLET | Freq: Three times a day (TID) | ORAL | Status: DC
  Administered 2018-10-14 – 2018-10-15 (×3): 1000 mg via ORAL
  Filled 2018-10-14 (×3): qty 2

## 2018-10-14 MED ORDER — KETAMINE HCL 10 MG/ML IJ SOLN
INTRAMUSCULAR | Status: AC
  Filled 2018-10-14: qty 1

## 2018-10-14 MED ORDER — LIDOCAINE 2% (20 MG/ML) 5 ML SYRINGE
INTRAMUSCULAR | Status: DC | PRN
  Administered 2018-10-14: 60 mg via INTRAVENOUS

## 2018-10-14 MED ORDER — ENOXAPARIN SODIUM 40 MG/0.4ML ~~LOC~~ SOLN
40.0000 mg | SUBCUTANEOUS | Status: DC
  Administered 2018-10-15 – 2018-10-17 (×3): 40 mg via SUBCUTANEOUS
  Filled 2018-10-14 (×3): qty 0.4

## 2018-10-14 MED ORDER — BUPIVACAINE-EPINEPHRINE 0.25% -1:200000 IJ SOLN
INTRAMUSCULAR | Status: DC | PRN
  Administered 2018-10-14: 30 mL

## 2018-10-14 MED ORDER — MIDAZOLAM HCL 5 MG/5ML IJ SOLN
INTRAMUSCULAR | Status: DC | PRN
  Administered 2018-10-14: 2 mg via INTRAVENOUS

## 2018-10-14 MED ORDER — POLYETHYLENE GLYCOL 3350 17 G PO PACK: 17.0000 g | PACK | Freq: Every day | ORAL | Status: DC | PRN

## 2018-10-14 MED ORDER — HYDRALAZINE HCL 20 MG/ML IJ SOLN: 5.0000 mg | INTRAMUSCULAR | Status: DC | PRN

## 2018-10-14 MED ORDER — PHENYLEPHRINE 40 MCG/ML (10ML) SYRINGE FOR IV PUSH (FOR BLOOD PRESSURE SUPPORT)
PREFILLED_SYRINGE | INTRAVENOUS | Status: DC | PRN
  Administered 2018-10-14 (×2): 80 ug via INTRAVENOUS

## 2018-10-14 SURGICAL SUPPLY — 65 items
APPLIER CLIP 5 13 M/L LIGAMAX5 (MISCELLANEOUS)
APPLIER CLIP ROT 10 11.4 M/L (STAPLE)
BLADE SURG SZ11 CARB STEEL (BLADE) ×3 IMPLANT
CHLORAPREP W/TINT 26ML (MISCELLANEOUS) ×3 IMPLANT
CLIP APPLIE 5 13 M/L LIGAMAX5 (MISCELLANEOUS) IMPLANT
CLIP APPLIE ROT 10 11.4 M/L (STAPLE) IMPLANT
COVER SURGICAL LIGHT HANDLE (MISCELLANEOUS) ×3 IMPLANT
COVER TIP SHEARS 8 DVNC (MISCELLANEOUS) ×2 IMPLANT
COVER TIP SHEARS 8MM DA VINCI (MISCELLANEOUS) ×3
COVER WAND RF STERILE (DRAPES) ×3 IMPLANT
DECANTER SPIKE VIAL GLASS SM (MISCELLANEOUS) ×3 IMPLANT
DRAIN CHANNEL 19F RND (DRAIN) IMPLANT
DRAIN PENROSE 18X1/2 LTX STRL (DRAIN) IMPLANT
DRAPE ARM DVNC X/XI (DISPOSABLE) ×8 IMPLANT
DRAPE COLUMN DVNC XI (DISPOSABLE) ×2 IMPLANT
DRAPE DA VINCI XI ARM (DISPOSABLE) ×12
DRAPE DA VINCI XI COLUMN (DISPOSABLE) ×3
DRAPE WARM FLUID 44X44 (DRAPE) ×3 IMPLANT
DRSG TEGADERM 2-3/8X2-3/4 SM (GAUZE/BANDAGES/DRESSINGS) ×18 IMPLANT
ELECT REM PT RETURN 15FT ADLT (MISCELLANEOUS) ×3 IMPLANT
ENDOLOOP SUT PDS II  0 18 (SUTURE)
ENDOLOOP SUT PDS II 0 18 (SUTURE) IMPLANT
EVACUATOR SILICONE 100CC (DRAIN) IMPLANT
FELT TEFLON 4 X1 (Mesh General) ×3 IMPLANT
GAUZE SPONGE 2X2 8PLY STRL LF (GAUZE/BANDAGES/DRESSINGS) ×2 IMPLANT
GLOVE BIO SURGEON STRL SZ 6.5 (GLOVE) ×6 IMPLANT
GLOVE BIOGEL PI IND STRL 6.5 (GLOVE) ×4 IMPLANT
GLOVE BIOGEL PI IND STRL 7.0 (GLOVE) ×6 IMPLANT
GLOVE BIOGEL PI INDICATOR 6.5 (GLOVE) ×2
GLOVE BIOGEL PI INDICATOR 7.0 (GLOVE) ×3
GLOVE ECLIPSE 8.0 STRL XLNG CF (GLOVE) ×6 IMPLANT
GLOVE INDICATOR 8.0 STRL GRN (GLOVE) ×6 IMPLANT
GLOVE SURG SS PI 7.0 STRL IVOR (GLOVE) ×3 IMPLANT
GOWN STRL REUS W/ TWL LRG LVL3 (GOWN DISPOSABLE) ×8 IMPLANT
GOWN STRL REUS W/TWL LRG LVL3 (GOWN DISPOSABLE) ×12
GOWN STRL REUS W/TWL XL LVL3 (GOWN DISPOSABLE) ×6 IMPLANT
IRRIG SUCT STRYKERFLOW 2 WTIP (MISCELLANEOUS) ×3
IRRIGATION SUCT STRKRFLW 2 WTP (MISCELLANEOUS) ×2 IMPLANT
KIT BASIN OR (CUSTOM PROCEDURE TRAY) ×3 IMPLANT
LUBRICANT JELLY K Y 4OZ (MISCELLANEOUS) ×3 IMPLANT
MESH PHASIX RESORB RECT 10X15 (Mesh General) ×3 IMPLANT
NEEDLE HYPO 22GX1.5 SAFETY (NEEDLE) ×3 IMPLANT
NEEDLE INSUFFLATION 14GA 120MM (NEEDLE) ×3 IMPLANT
PACK CARDIOVASCULAR III (CUSTOM PROCEDURE TRAY) ×3 IMPLANT
PAD POSITIONING PINK XL (MISCELLANEOUS) ×3 IMPLANT
SCISSORS LAP 5X45 EPIX DISP (ENDOMECHANICALS) ×3 IMPLANT
SEAL CANN UNIV 5-8 DVNC XI (MISCELLANEOUS) ×8 IMPLANT
SEAL XI 5MM-8MM UNIVERSAL (MISCELLANEOUS) ×12
SEALER VESSEL DA VINCI XI (MISCELLANEOUS) ×3
SEALER VESSEL EXT DVNC XI (MISCELLANEOUS) ×2 IMPLANT
SOLUTION ELECTROLUBE (MISCELLANEOUS) ×3 IMPLANT
SPONGE GAUZE 2X2 STER 10/PKG (GAUZE/BANDAGES/DRESSINGS) ×1
SPONGE LAP 18X18 RF (DISPOSABLE) ×3 IMPLANT
SUT ETHIBOND 0 36 GRN (SUTURE) ×6 IMPLANT
SUT ETHIBOND NAB CT1 #1 30IN (SUTURE) ×6 IMPLANT
SUT MNCRL AB 4-0 PS2 18 (SUTURE) ×3 IMPLANT
SUT PROLENE 2 0 SH DA (SUTURE) ×3 IMPLANT
SUT V-LOC BARB 180 2/0GR6 GS22 (SUTURE) ×6
SUTURE V-LC BRB 180 2/0GR6GS22 (SUTURE) ×4 IMPLANT
SYR 10ML LL (SYRINGE) ×3 IMPLANT
SYR 20CC LL (SYRINGE) ×3 IMPLANT
TOWEL OR 17X26 10 PK STRL BLUE (TOWEL DISPOSABLE) ×3 IMPLANT
TOWEL OR NON WOVEN STRL DISP B (DISPOSABLE) ×3 IMPLANT
TROCAR ADV FIXATION 5X100MM (TROCAR) ×3 IMPLANT
TUBING INSUFFLATION 10FT LAP (TUBING) ×3 IMPLANT

## 2018-10-14 NOTE — Interval H&P Note (Signed)
History and Physical Interval Note:  10/14/2018 6:55 AM  Angela Mcconnell  has presented today for surgery, with the diagnosis of PARAESOPHAGEAL HIATAL HERNIA  The various methods of treatment have been discussed with the patient and family. After consideration of risks, benefits and other options for treatment, the patient has consented to  Procedure(s): XI ROBOTIC REPAIR PARAESOPHAGEAL HIATAL HERNIA WITH FUNDOPLICATION. PHASIX MESH (N/A) INSERTION OF MESH (N/A) as a surgical intervention .  The patient's history has been reviewed, patient examined, no change in status, stable for surgery.  I have reviewed the patient's chart and labs.  Questions were answered to the patient's satisfaction.    I have re-reviewed the the patient's records, history, medications, and allergies.  I have re-examined the patient.  I again discussed intraoperative plans and goals of post-operative recovery.  The patient agrees to proceed.  Angela Mcconnell [facility]  29-May-1961 [id number]  Patient Care Team: Leighton Ruff, MD as PCP - General (Family Medicine) Michael Boston, MD as Consulting Physician (General Surgery) Nunzio Cobbs, MD as Consulting Physician (Obstetrics and Gynecology) Clarene Essex, MD as Consulting Physician (Gastroenterology)  Patient Active Problem List   Diagnosis Date Noted  . RBBB 07/16/2013  . Normal coronary arteries- Nov 2010 07/16/2013  . Dyspnea on exertion 07/16/2013  . Anemia 07/16/2013  . Chest pain at rest 07/15/2013  . PND (paroxysmal nocturnal dyspnea) 07/15/2013  . Asthma     Past Medical History:  Diagnosis Date  . Arthritis   . Asthma   . Palpitations   . Shortness of breath     Past Surgical History:  Procedure Laterality Date  . ABDOMINAL HYSTERECTOMY    . CORONARY ANGIOGRAM  Nov 2010   Normal coronary arteries  . ESOPHAGEAL MANOMETRY N/A 08/12/2018   Procedure: ESOPHAGEAL MANOMETRY (EM);  Surgeon: Clarene Essex, MD;  Location: [facility];   Service: Endoscopy;  Laterality: N/A;  . KNEE SURGERY      Social History   Socioeconomic History  . Marital status: Significant Other    Spouse name: Not on file  . Number of children: Not on file  . Years of education: Not on file  . Highest education level: Not on file  Occupational History  . Not on file  Social Needs  . Financial resource strain: Not on file  . Food insecurity:    Worry: Not on file    Inability: Not on file  . Transportation needs:    Medical: Not on file    Non-medical: Not on file  Tobacco Use  . Smoking status: Never Smoker  . Smokeless tobacco: Never Used  Substance and Sexual Activity  . Alcohol use: No  . Drug use: No  . Sexual activity: Not on file  Lifestyle  . Physical activity:    Days per week: Not on file    Minutes per session: Not on file  . Stress: Not on file  Relationships  . Social connections:    Talks on phone: Not on file    Gets together: Not on file    Attends religious service: Not on file    Active member of club or organization: Not on file    Attends meetings of clubs or organizations: Not on file    Relationship status: Not on file  . Intimate partner violence:    Fear of current or ex partner: Not on file    Emotionally abused: Not on file    Physically abused: Not on file  Forced sexual activity: Not on file  Other Topics Concern  . Not on file  Social History Narrative  . Not on file    Family History  Problem Relation Age of Onset  . Cancer Sister     Medications Prior to Admission  Medication Sig Dispense Refill Last Dose  . Cholecalciferol (VITAMIN D3) 5000 units CAPS Take 10,000 Units by mouth daily.     . clobetasol cream (TEMOVATE) 5.94 % Apply 1 application topically daily as needed for itching or dry skin.  3   . Cyanocobalamin (B-12 PO) Take 1 tablet by mouth daily.     . cyclobenzaprine (FLEXERIL) 10 MG tablet Take 1 tablet (10 mg total) by mouth 2 (two) times daily as needed for muscle  spasms. (Patient taking differently: Take 10 mg by mouth at bedtime. ) 20 tablet 0   . ibuprofen (ADVIL,MOTRIN) 200 MG tablet Take 400 mg by mouth daily as needed for headache or moderate pain.     . Melatonin 5 MG TABS Take 5 mg by mouth at bedtime as needed (sleep).     . minoxidil (LONITEN) 2.5 MG tablet Take 2.5 mg by mouth daily.  3   . pantoprazole (PROTONIX) 40 MG tablet Take 40 mg by mouth 2 (two) times daily.     . hydrochlorothiazide (MICROZIDE) 12.5 MG capsule Take 1 capsule (12.5 mg total) by mouth daily. (Patient not taking: Reported on 10/05/2018) 30 capsule 0 Not Taking at Unknown time  . naproxen (NAPROSYN) 500 MG tablet Take 1 tablet (500 mg total) by mouth 2 (two) times daily with a meal. (Patient not taking: Reported on 10/05/2018) 20 tablet 0 Not Taking at Unknown time    No current facility-administered medications for this encounter.      Allergies  Allergen Reactions  . Sulfa Antibiotics Itching  . Tape Itching    Causes burn marks   . Clotrimazole     Made rash worse  . Eggs Or Egg-Derived Products Hives    mouth and throat itching  . Hydrocodone Itching  . Shellfish Allergy Hives    Mouth and throat itching    BP (!) 148/95   Pulse 80   Temp 98.5 F (36.9 C) (Oral)   Resp 16   SpO2 99%   Labs: No results found for this or any previous visit (from the past 48 hour(s)).  Imaging / Studies: No results found.   [name], M.D., F.A.C.S. Gastrointestinal and Minimally Invasive Surgery [facility] [address] [place], [place] [address] [phone] Main / Paging  10/14/2018 6:55 AM    [name]

## 2018-10-14 NOTE — Transfer of Care (Signed)
Immediate Anesthesia Transfer of Care Note  Patient: Angela Mcconnell  Procedure(s) Performed: XI ROBOTIC REPAIR PARAESOPHAGEAL HIATAL HERNIA WITH FUNDOPLICATION (N/A Abdomen) INSERTION OF MESH (N/A )  Patient Location: PACU  Anesthesia Type:General  Level of Consciousness: awake, alert , drowsy and patient cooperative  Airway & Oxygen Therapy: Patient Spontanous Breathing and Patient connected to face mask oxygen  Post-op Assessment: Report given to RN, Post -op Vital signs reviewed and stable and Patient moving all extremities  Post vital signs: Reviewed and stable  Last Vitals:  Vitals Value Taken Time  BP 167/97 10/14/2018 11:45 AM  Temp    Pulse 104 10/14/2018 11:46 AM  Resp    SpO2 100 % 10/14/2018 11:46 AM  Vitals shown include unvalidated device data.  Last Pain:  Vitals:   10/14/18 0729  TempSrc:   PainSc: 0-No pain         Complications: No apparent anesthesia complications

## 2018-10-14 NOTE — Anesthesia Procedure Notes (Deleted)
Anesthesia Regional Block: Adductor canal block   Pre-Anesthetic Checklist: ,, timeout performed, Correct Patient, Correct Site, Correct Laterality, Correct Procedure, Correct Position, site marked, Risks and benefits discussed,  Surgical consent,  Pre-op evaluation,  At surgeon's request and post-op pain management  Laterality: Left  Prep: Maximum Sterile Barrier Precautions used, chloraprep       Needles:  Injection technique: Single-shot  Needle Type: Echogenic Needle     Needle Length: 9cm  Needle Gauge: 21     Additional Needles:   Procedures:,,,, ultrasound used (permanent image in chart),,,,  Narrative:  Start time: 10/14/2018 8:00 AM End time: 10/14/2018 8:10 AM Injection made incrementally with aspirations every 5 mL.  Performed by: Personally  Anesthesiologist: Barnet Glasgow, MD  Additional Notes: 1 attempt . Patient tolerated procedure well.

## 2018-10-14 NOTE — Discharge Instructions (Signed)
EATING AFTER YOUR ESOPHAGEAL SURGERY (Stomach Fundoplication, Hiatal Hernia repair, Achalasia surgery, etc)  ######################################################################  EAT Start with a pureed / full liquid diet (see below) Gradually transition to a high fiber diet with a fiber supplement over the next month after discharge.    WALK Walk an hour a day.  Control your pain to do that.    CONTROL PAIN Control pain so that you can walk, sleep, tolerate sneezing/coughing, go up/down stairs.  HAVE A BOWEL MOVEMENT DAILY Keep your bowels regular to avoid problems.  OK to try a laxative to override constipation.  OK to use an antidairrheal to slow down diarrhea.  Call if not better after 2 tries  CALL IF YOU HAVE PROBLEMS/CONCERNS Call if you are still struggling despite following these instructions. Call if you have concerns not answered by these instructions  ######################################################################   After your esophageal surgery, expect some sticking with swallowing over the next 1-2 months.    If food sticks when you eat, it is called "dysphagia".  This is due to swelling around your esophagus at the wrap & hiatal diaphragm repair.  It will gradually ease off over the next few months.  To help you through this temporary phase, we start you out on a pureed (blenderized) diet.  Your first meal in the hospital was thin liquids.  You should have been given a pureed diet by the time you left the hospital.  We ask patients to stay on a pureed diet for the first 2-3 weeks to avoid anything getting "stuck" near your recent surgery.  Don't be alarmed if your ability to swallow doesn't progress according to this plan.  Everyone is different and some diets can advance more or less quickly.     Some BASIC RULES to follow are:  Maintain an upright position whenever eating or drinking.  Take small bites - just a teaspoon size bite at a time.  Eat slowly.   It may also help to eat only one food at a time.  Consider nibbling through smaller, more frequent meals & avoid the urge to eat BIG meals  Do not push through feelings of fullness, nausea, or bloatedness  Do not mix solid foods and liquids in the same mouthful  Try not to "wash foods down" with large gulps of liquids.  Avoid carbonated (bubbly/fizzy) drinks.    Avoid foods that make you feel gassy or bloated.  Start with bland foods first.  Wait on trying greasy, fried, or spicy meals until you are tolerating more bland solids well.  Understand that it will be hard to burp and belch at first.  This gradually improves with time.  Expect to be more gassy/flatulent/bloated initially.  Walking will help your body manage it better.  Consider using medications for bloating that contain simethicone such as  Maalox or Gas-X   Eat in a relaxed atmosphere & minimize distractions.  Avoid talking while eating.    Do not use straws.  Following each meal, sit in an upright position (90 degree angle) for 60 to 90 minutes.  Going for a short walk can help as well  If food does stick, don't panic.  Try to relax and let the food pass on its own.  Sipping WARM LIQUID such as strong hot black tea can also help slide it down.   Be gradual in changes & use common sense:  -If you easily tolerating a certain "level" of foods, advance to the next level gradually -If you are  having trouble swallowing a particular food, then avoid it.   -If food is sticking when you advance your diet, go back to thinner previous diet (the lower LEVEL) for 1-2 days.  LEVEL 1 = PUREED DIET  Do for the first 2 WEEKS AFTER SURGERY  -Foods in this group are pureed or blenderized to a smooth, mashed potato-like consistency.  -If necessary, the pureed foods can keep their shape with the addition of a thickening agent.   -Meat should be pureed to a smooth, pasty consistency.  Hot broth or gravy may be added to the pureed  meat, approximately 1 oz. of liquid per 3 oz. serving of meat. -CAUTION:  If any foods do not puree into a smooth consistency, swallowing will be more difficult.  (For example, nuts or seeds sometimes do not blend well.)  Hot Foods Cold Foods  Pureed scrambled eggs and cheese Pureed cottage cheese  Baby cereals Thickened juices and nectars  Thinned cooked cereals (no lumps) Thickened milk or eggnog  Pureed [place] toast or pancakes Ensure  Mashed potatoes Ice cream  Pureed parsley, au gratin, scalloped potatoes, candied sweet potatoes Fruit or [place] ice, sherbet  Pureed buttered or alfredo noodles Plain yogurt  Pureed vegetables (no corn or peas) Instant breakfast  Pureed soups and creamed soups Smooth pudding, mousse, custard  Pureed scalloped apples Whipped gelatin  Gravies Sugar, syrup, honey, jelly  Sauces, cheese, tomato, barbecue, white, creamed Cream  Any baby food Creamer  Alcohol in moderation (not beer or champagne) Margarine  Coffee or tea Mayonnaise   Ketchup, mustard   Apple sauce   SAMPLE MENU:  PUREED DIET Breakfast Lunch Dinner   Orange juice, 1/2 cup  Cream of wheat, 1/2 cup  Pineapple juice, 1/2 cup  Pureed [place], barley soup, 3/4 cup  Pureed Hawaiian chicken, 3 oz   Scrambled eggs, mashed or blended with cheese, 1/2 cup  Tea or coffee, 1 cup   Whole milk, 1 cup   Non-dairy creamer, 2 Tbsp.  Mashed potatoes, 1/2 cup  Pureed cooled broccoli, 1/2 cup  Apple sauce, 1/2 cup  Coffee or tea  Mashed potatoes, 1/2 cup  Pureed spinach, 1/2 cup  Frozen yogurt, 1/2 cup  Tea or coffee      LEVEL 2 = SOFT DIET  After your first 2 weeks, you can advance to a soft diet.   Keep on this diet until everything goes down easily.  Hot Foods Cold Foods  White fish Cottage cheese  Stuffed fish Junior baby fruit  Baby food meals Semi thickened juices  Minced soft cooked, scrambled, poached eggs nectars  Souffle & omelets Ripe mashed bananas  Cooked  cereals Canned fruit, pineapple sauce, milk  potatoes Milkshake  Buttered or Alfredo noodles Custard  Cooked cooled vegetable Puddings, including tapioca  Sherbet Yogurt  Vegetable soup or alphabet soup Fruit ice, [place] ice  Gravies Whipped gelatin  Sugar, syrup, honey, jelly Junior baby desserts  Sauces:  Cheese, creamed, barbecue, tomato, white Cream  Coffee or tea Margarine   SAMPLE MENU:  LEVEL 2 Breakfast Lunch Dinner   Orange juice, 1/2 cup  Oatmeal, 1/2 cup  Scrambled eggs with cheese, 1/2 cup  Decaffeinated tea, 1 cup  Whole milk, 1 cup  Non-dairy creamer, 2 Tbsp  Pineapple juice, 1/2 cup  Minced beef, 3 oz  Gravy, 2 Tbsp  Mashed potatoes, 1/2 cup  Minced fresh broccoli, 1/2 cup  Applesauce, 1/2 cup  Coffee, 1 cup  [place], barley soup, 3/4 cup  Minced Hawaiian chicken, 3 oz  Mashed potatoes, 1/2 cup  Cooked spinach, 1/2 cup  Frozen yogurt, 1/2 cup  Non-dairy creamer, 2 Tbsp      LEVEL 3 = CHOPPED DIET  -After all the foods in level 2 (soft diet) are passing through well you should advance up to more chopped foods.  -It is still important to cut these foods into small pieces and eat slowly.  Hot Foods Cold Foods  Poultry Cottage cheese  Chopped Swedish meatballs Yogurt  Meat salads (ground or flaked meat) Milk  Flaked fish (tuna) Milkshakes  Poached or scrambled eggs Soft, cold, dry cereal  Souffles and omelets Fruit juices or nectars  Cooked cereals Chopped canned fruit  Chopped [place] toast or pancakes Canned fruit cocktail  Noodles or pasta (no rice) Pudding, mousse, custard  Cooked vegetables (no frozen peas, corn, or mixed vegetables) Green salad  Canned small sweet peas Ice cream  Creamed soup or vegetable soup Fruit ice, [place] ice  Pureed vegetable soup or alphabet soup Non-dairy creamer  Ground scalloped apples Margarine  Gravies Mayonnaise  Sauces:  Cheese, creamed, barbecue, tomato, white Ketchup  Coffee or tea Mustard    SAMPLE MENU:  LEVEL 3 Breakfast Lunch Dinner   Orange juice, 1/2 cup  Oatmeal, 1/2 cup  Scrambled eggs with cheese, 1/2 cup  Decaffeinated tea, 1 cup  Whole milk, 1 cup  Non-dairy creamer, 2 Tbsp  Ketchup, 1 Tbsp  Margarine, 1 tsp  Salt, 1/4 tsp  Sugar, 2 tsp  Pineapple juice, 1/2 cup  Ground beef, 3 oz  Gravy, 2 Tbsp  Mashed potatoes, 1/2 cup  Cooked spinach, 1/2 cup  Applesauce, 1/2 cup  Decaffeinated coffee  Whole milk  Non-dairy creamer, 2 Tbsp  Margarine, 1 tsp  Salt, 1/4 tsp  Pureed [place], barley soup, 3/4 cup  Barbecue chicken, 3 oz  Mashed potatoes, 1/2 cup  Ground fresh broccoli, 1/2 cup  Frozen yogurt, 1/2 cup  Decaffeinated tea, 1 cup  Non-dairy creamer, 2 Tbsp  Margarine, 1 tsp  Salt, 1/4 tsp  Sugar, 1 tsp    LEVEL 4:  REGULAR FOODS  -Foods in this group are soft, moist, regularly textured foods.   -This level includes meat and breads, which tend to be the hardest things to swallow.   -Eat very slowly, chew well and continue to avoid carbonated drinks. -most people are at this level in 4-6 weeks  Hot Foods Cold Foods  Baked fish or skinned Soft cheeses - cottage cheese  Souffles and omelets Cream cheese  Eggs Yogurt  Stuffed shells Milk  Spaghetti with meat sauce Milkshakes  Cooked cereal Cold dry cereals (no nuts, dried fruit, coconut)  [place] toast or pancakes Crackers  Buttered toast Fruit juices or nectars  Noodles or pasta (no rice) Canned fruit  Potatoes (all types) Ripe bananas  Soft, cooked vegetables (no corn, lima, or baked beans) Peeled, ripe, fresh fruit  Creamed soups or vegetable soup Cakes (no nuts, dried fruit, coconut)  Canned chicken noodle soup Plain doughnuts  Gravies Ice cream  Bacon dressing Pudding, mousse, custard  Sauces:  Cheese, creamed, barbecue, tomato, white Fruit ice, [place] ice, sherbet  Decaffeinated tea or coffee Whipped gelatin  Pork chops Regular gelatin   Canned fruited  gelatin molds   Sugar, syrup, honey, jam, jelly   Cream   Non-dairy   Margarine   Oil   Mayonnaise   Ketchup   Mustard   TROUBLESHOOTING IRREGULAR BOWELS  1) Avoid extremes of bowel  movements (no bad constipation/diarrhea)  2) Miralax 17gm mixed in [place]. water or juice-daily. May use BID as needed.  3) Gas-x,Phazyme, etc. as needed for gas & bloating.  4) Soft,bland diet. No spicy,greasy,fried foods.  5) Prilosec over-the-counter as needed  6) May hold gluten/wheat products from diet to see if symptoms improve.  7) May try probiotics (Align, Activa, etc) to help calm the bowels down  7) If symptoms become worse call back immediately.    If you have any questions please call our office at [place]: [phone].  LAPAROSCOPIC SURGERY: POST OP INSTRUCTIONS  ######################################################################  EAT Gradually transition to a high fiber diet with a fiber supplement over the next few weeks after discharge.  Start with a pureed / full liquid diet (see below)  WALK Walk an hour a day.  Control your pain to do that.    CONTROL PAIN Control pain so that you can walk, sleep, tolerate sneezing/coughing, go up/down stairs.  HAVE A BOWEL MOVEMENT DAILY Keep your bowels regular to avoid problems.  OK to try a laxative to override constipation.  OK to use an antidairrheal to slow down diarrhea.  Call if not better after 2 tries  CALL IF YOU HAVE PROBLEMS/CONCERNS Call if you are still struggling despite following these instructions. Call if you have concerns not answered by these instructions  ######################################################################    1. DIET: Follow a light bland diet the first 24 hours after arrival home, such as soup, liquids, crackers, etc.  Be sure to include lots of fluids daily.  Avoid fast food or heavy meals as your are more likely to get nauseated.  Eat a low fat the next few days after  surgery.   2. Take your usually prescribed home medications unless otherwise directed. 3. PAIN CONTROL: a. Pain is best controlled by a usual combination of three different methods TOGETHER: i. Ice/Heat ii. Over the counter pain medication iii. Prescription pain medication b. Most patients will experience some swelling and bruising around the incisions.  Ice packs or heating pads (30-60 minutes up to 6 times a day) will help. Use ice for the first few days to help decrease swelling and bruising, then switch to heat to help relax tight/sore spots and speed recovery.  Some people prefer to use ice alone, heat alone, alternating between ice & heat.  Experiment to what works for you.  Swelling and bruising can take several weeks to resolve.   c. It is helpful to take an over-the-counter pain medication regularly for the first few weeks.  Choose one of the following that works best for you: i. Naproxen (Aleve, etc)  Two 220mg  tabs twice a day ii. Ibuprofen (Advil, etc) Three 200mg  tabs four times a day (every meal & bedtime) iii. Acetaminophen (Tylenol, etc) 500-650mg  four times a day (every meal & bedtime) d. A  prescription for pain medication (such as oxycodone, hydrocodone, etc) should be given to you upon discharge.  Take your pain medication as prescribed.  i. If you are having problems/concerns with the prescription medicine (does not control pain, nausea, vomiting, rash, itching, etc), please call us [phone] to see if we need to switch you to a different pain medicine that will work better for you and/or control your side effect better. ii. If you need a refill on your pain medication, please contact your pharmacy.  They will contact our office to request authorization. Prescriptions will not be filled after 5 pm or on week-ends. 4. Avoid getting  constipated.  Between the surgery and the pain medications, it is common to experience some constipation.  Increasing fluid intake and taking a  fiber supplement (such as Metamucil, Citrucel, FiberCon, MiraLax, etc) 1-2 times a day regularly will usually help prevent this problem from occurring.  A mild laxative (prune juice, Milk of Magnesia, MiraLax, etc) should be taken according to package directions if there are no bowel movements after 48 hours.   5. Watch out for diarrhea.  If you have many loose bowel movements, simplify your diet to bland foods & liquids for a few days.  Stop any stool softeners and decrease your fiber supplement.  Switching to mild anti-diarrheal medications (Kayopectate, Pepto Bismol) can help.  If this worsens or does not improve, please call us. 6. Wash / shower every day.  You may shower over the dressings as they are waterproof.  Continue to shower over incision(s) after the dressing is off. 7. Remove your waterproof bandages 5 days after surgery.  You may leave the incision open to air.  You may replace a dressing/Band-Aid to cover the incision for comfort if you wish.  8. ACTIVITIES as tolerated:   a. You may resume regular (light) daily activities beginning the next day--such as daily self-care, walking, climbing stairs--gradually increasing activities as tolerated.  If you can walk 30 minutes without difficulty, it is safe to try more intense activity such as jogging, treadmill, bicycling, low-impact aerobics, swimming, etc. b. Save the most intensive and strenuous activity for last such as sit-ups, heavy lifting, contact sports, etc  Refrain from any heavy lifting or straining until you are off narcotics for pain control.   c. DO NOT PUSH THROUGH PAIN.  Let pain be your guide: If it hurts to do something, don't do it.  Pain is your body warning you to avoid that activity for another week until the pain goes down. d. You may drive when you are no longer taking prescription pain medication, you can comfortably wear a seatbelt, and you can safely maneuver your car and apply brakes. e. Dennis Bast may have sexual intercourse  when it is comfortable.  9. FOLLOW UP in our office a. Please call [facility] at [phone] to set up an appointment to see your surgeon in the office for a follow-up appointment approximately 2-3 weeks after your surgery. b. Make sure that you call for this appointment the day you arrive home to insure a convenient appointment time. 10. IF YOU HAVE DISABILITY OR FAMILY LEAVE FORMS, BRING THEM TO THE OFFICE FOR PROCESSING.  DO NOT GIVE THEM TO YOUR DOCTOR.   WHEN TO CALL us [phone]: 1. Poor pain control 2. Reactions / problems with new medications (rash/itching, nausea, etc)  3. Fever over 101.5 F (38.5 C) 4. Inability to urinate 5. Nausea and/or vomiting 6. Worsening swelling or bruising 7. Continued bleeding from incision. 8. Increased pain, redness, or drainage from the incision   The clinic staff is available to answer your questions during regular business hours (8:30am-5pm).  Please dont hesitate to call and ask to speak to one of our nurses for clinical concerns.   If you have a medical emergency, go to the nearest emergency room or call 911.  A surgeon from [facility] is always on call at the [facility], [place], [place], [place], [place]  [address] ? MAIN: [phone] ? TOLL FREE: [phone] ?  FAX [phone] [email or web address]

## 2018-10-14 NOTE — Anesthesia Postprocedure Evaluation (Signed)
Anesthesia Post Note  Patient: Angela Mcconnell  Procedure(s) Performed: XI ROBOTIC REPAIR PARAESOPHAGEAL HIATAL HERNIA WITH FUNDOPLICATION (N/A Abdomen) INSERTION OF MESH (N/A )     Patient location during evaluation: PACU Anesthesia Type: General Level of consciousness: awake and alert Pain management: pain level controlled Vital Signs Assessment: post-procedure vital signs reviewed and stable Respiratory status: spontaneous breathing, nonlabored ventilation, respiratory function stable and patient connected to nasal cannula oxygen Cardiovascular status: blood pressure returned to baseline and stable Postop Assessment: no apparent nausea or vomiting Anesthetic complications: no    Last Vitals:  Vitals:   10/14/18 1300 10/14/18 1412  BP: (!) 175/95 (!) 163/92  Pulse: (!) 101 98  Resp: 18 16  Temp: (!) 36.4 C   SpO2: 99% 100%    Last Pain:  Vitals:   10/14/18 1344  TempSrc:   PainSc: 5                  Barnet Glasgow

## 2018-10-14 NOTE — Anesthesia Procedure Notes (Signed)
Procedure Name: Intubation Date/Time: 10/14/2018 8:46 AM Performed by: Mitzie Na, CRNA Pre-anesthesia Checklist: Patient identified, Emergency Drugs available, Suction available, Patient being monitored and Timeout performed Patient Re-evaluated:Patient Re-evaluated prior to induction Oxygen Delivery Method: Circle system utilized Preoxygenation: Pre-oxygenation with 100% oxygen Induction Type: IV induction Ventilation: Mask ventilation without difficulty and Oral airway inserted - appropriate to patient size Laryngoscope Size: Mac and 3 Grade View: Grade I Tube type: Oral Tube size: 7.0 mm Number of attempts: 1 Airway Equipment and Method: Stylet Placement Confirmation: ETT inserted through vocal cords under direct vision,  positive ETCO2 and breath sounds checked- equal and bilateral Secured at: 23 cm Tube secured with: Tape Dental Injury: Teeth and Oropharynx as per pre-operative assessment

## 2018-10-14 NOTE — Op Note (Addendum)
10/14/2018  11:47 AM  PATIENT:  Angela Mcconnell  57 y.o. female  Patient Care Team: Leighton Ruff, MD as PCP - General (Family Medicine) Michael Boston, MD as Consulting Physician (General Surgery) Yisroel Ramming, Everardo All, MD as Consulting Physician (Obstetrics and Gynecology) Clarene Essex, MD as Consulting Physician (Gastroenterology)  PRE-OPERATIVE DIAGNOSIS:  PARAESOPHAGEAL HIATAL HERNIA  POST-OPERATIVE DIAGNOSIS:  PARAESOPHAGEAL HIATAL HERNIA  PROCEDURE:   1. ROBOTIC reduction of paraesophageal hiatal hernia 2. Type II mediastinal dissection. 3. Primary repair of hiatal hernia over pledgets.  4. Posterior gastropexy. 5. Nissen fundoplication 2 cm over a 54-French bougie 6. Mesh reinforcement with absorbable mesh  SURGEON:  [name], MD  ASSIST:   Gurney Maxin, MD Magnus Ivan, PA-S, [facility]  ANESTHESIA:   local and general  EBL:  Total I/O In: 1000 [I.V.:1000] Out: 75 [Blood:75]  Delay start of Pharmacological VTE agent (>24hrs) due to surgical blood loss or risk of bleeding:  no  ANESTHESIA: 1. General anesthesia. 2. Local anesthetic in a field block around all port sites.  SPECIMEN:  Mediastinal hernia sac (not sent).  DRAINS: NONE  COUNTS:  YES  PLAN OF CARE: Admit for overnight observation  PATIENT DISPOSITION:  PACU - hemodynamically stable.  INDICATION:   Patient with symptomatic paraesophageal hiatal hernia.  The patient has had extensive work-up & we feel the patient will benefit from repair:  The anatomy & physiology of the foregut and anti-reflux mechanism was discussed.  The pathophysiology of hiatal herniation and GERD was discussed.  Natural history risks without surgery was discussed.   The patient's symptoms are not adequately controlled by medicines and other non-operative treatments.  I feel the risks of no intervention will lead to serious problems that outweigh the operative risks; therefore, I recommended  surgery to reduce the hiatal hernia out of the chest and fundoplication to rebuild the anti-reflux valve and control reflux better.  Need for a thorough workup to rule out the differential diagnosis and plan treatment was explained.  I explained laparoscopic techniques with possible need for an open approach.  Risks such as bleeding, infection, abscess, leak, need for further treatment, heart attack, death, and other risks were discussed.   I noted a good likelihood this will help address the problem.  Goals of post-operative recovery were discussed as well.  Possibility that this will not correct all symptoms was explained.  Post-operative dysphagia, need for short-term liquid & pureed diet, inability to vomit, possibility of reherniation, possible need for medicines to help control symptoms in addition to surgery were discussed.  We will work to minimize complications.   Educational handouts further explaining the pathology, treatment options, and dysphagia diet was given as well.  Questions were answered.  The patient expresses understanding & wishes to proceed with surgery.  OR FINDINGS:   Sliding paraesophageal hiatal hernia with 10% of the stomach in the mediastinum.  There was a 6 x 5 cm hiatal defect.  It is a primary repair over pledgets. Mesh reinforcement was used with Mesh was used: PhasixT Mesh (a knitted monofilament mesh scaffold using Poly-4-hydroxybutyrate (P4HB), a biologically derived, fully resorbable material)  The patient has a 2 cm Nissen fundoplication that was done over 54-French bougie.  The patient has had anterior and posterior gastropexy.  DESCRIPTION:   Informed consent was confirmed.  The patient received IV antibiotics prior to incision.  The underwent general anesthesia without difficulty.  A Foley catheter sterilely placed.  The patient was positioned in  split leg with arms tucked. The abdomen was prepped and draped in the sterile fashion.  Surgical time-out confirmed  our plan.  I placed a 8 mm port in the left subcostal region using Varess entry technique with the patient in steep reverse Trendelenburg and left side up.  Entry was clean.  We induced carbon dioxide insufflation.  Camera inspection revealed no injury.  Under direct visualization, I placed extra ports.  I also placed a 5 mm port in the left subxiphoid region under direct visualization.  I removed that and placed an Omega-shaped rigid Nathanson liver retractor to lift the left lateral sector of the liver anteriorly to expose the esophageal hiatus.  This was secured to the bed using the iron man system.  The Xi robot was carefully docked and instruments placed and advanced under direct visualization.  We focused on dissection.  Patient had a sliding hiatal hernia.  Not particularly large we grasped the anterior mediastinal sac at the apex of the crus.  I scored through that and got into the anterior mediastinum.  I was able to free the mediastinal sac from its attachments to the pericardium and bilateral pleura using primarily focused gentle blunt dissection as well as focused vessel sealer dissection.  I transected phrenoesophageal attachments to the inner right crus, preserving a 1 centimeter cuff of mediastinal sac until I found the base of the crura.  Patient had replaced right vessels, so this was skeletonized and preserved.    I then came around anteriorly on the left side and freed up the phrenoesophageal attachments of the mediastinal sac on the medial part of the left crus on the superior half.  I did careful mediastinal dissection to free the mediastinal sac.  With that, we could reduce the sliding aspect of the cardia and fundus out of the mediastinum.  We ligated the short gastrics along the lesser curvature of the stomach about a third the way down and then came up proximally over the fundus.  We released the attachments of the stomach to the retroperitoneum until we were able to connect with the  prior dissection on the left crus.  We completed the release of phrenoesophageal attachments to the medial part of the left crus down to its base.  With this, we had circumferential mobilization.    We placed the stomach and esophagus on axial tension.  I then did a Type II mediastinal dissection where I freed the esophagus from its attachments to the aorta, spine, pleura, and pericardium using primarily gentle blunt as well as focused ultrasonic dissection.  We saw the anterior & posterior vagus nerves intact.  We preserved it at all times.  I procedded to dissect about 15 cm proximally into the mediastinum.  With that I could straighten out the esophagus and get 5 cm of intra-abdominal length of the esophagus at a best estimation.  I freed the anterior mediastinal sac off the esophagus & stomach.  We saw the anterior vagus nerve and freed the sac off of the vagus.  I dissected out & removed the fatty  epiphrenic pads at the esophagogastric junction. With that, I could better define the esophagogastric junction.  I confirmed the the patient had 5 cm of intra-abdominal esophageal length off tension.  Lyse some hepatic and other right-sided crural attachments to help streak of the right crus of and get some laxity  I brought the fundus of the stomach posterior to the esophagus over to the right side.  The wrap was  mobile with the classic shoe shine maneuver.  Wrap became together gently.  We reflected the stomach left laterally and closed the esophageal hiatus using 0 Ethibond stitch using horizontal mattress stitches with pledgets on both sides.  I did that x2 stitches.  Then used an extra similar product suture to help close height is to be more snug around the esophagus without being constricting.  The crura had good substance and they came together well without any tension.  Because of relatively young age and obesity, I reinforced the repair using a 15 x 10 cm biologic Phasix mesh.  I cut out a 2x4 cm  part of mesh in the middle third of the mesh such that the mesh had a broad U shape transversely, one tail 5 cm wide & the other 8cm.  We brought the mesh in and laid it over the crural repair, tails anterior over the crura.  I tucked the broader "U" tail of the mesh between the left diaphragm and the spleen, the narrower "U" tail over the right crus.  I then secured the posterior & anterior corners of the narrower"U" tail with 0 Ethibond upper interrupted suture to the right crus.  I secured to the left lateral and left superior sides of the broader "U" tail to the left diaphragm band with 2-0 V lock running suture.   I brought the fundus of the stomach behind the esophagus and cardia to set up a fundoplication wrap.  I did a posterior gastropexy by taking of #1 Ethibond stitches to the posterior part of the right side of the wrap and thru the Phasix mesh and crural closure.  Times 3 sutures I placed a similar stitch on the left anterior side as well.  That way the stomach covered the mesh and protected it from any esophageal exposure.  With the anterior and posterior gastropexy's, stomach laid well for a fundoplication wrap.   Next, Anesthesia passed a 54-French bougie transorally placed the stomach and esophagus under gentle axial tension.  The bougie passed down easily without resistance.  I then did a classic 2 cm Nissen fundoplication on the true esophagus above the cardia using Ethibond stitch in the left side of the wrap, then anterior esophagus, and then right side of the wrap and tied that down. Did 3 stitches.  I measured it and it was 2 cm in length.  We removed the bougie.  It was intact.    The wrap was soft and floppy.  I did irrigation and ensured hemostasis.  I saw no evidence of any leak or perforation or other abnormality.  I removed the [facility] liver retractor under direct visualization.  There was not much of a mediastinal deep space pocket from the hernia, so I did not leave a drain.  I  evacuated carbon dioxide and removed the ports.  The skin was closed with Monocryl and sterile dressings applied.  The patient is being extubated and brought back to the recovery room.  I discussed postop care in detail with the patient and family in in the office.  Discussed again with the patient in the holding area.  I discussed operative findings, updated the patient's status, discussed probable steps to recovery, and gave postoperative recommendations to the patient's family.  Recommendations were made.  Questions were answered.  He expressed understanding & appreciation.   [name], M.D., F.A.C.S. Gastrointestinal and Minimally Invasive Surgery [facility] [address], [place], [place] [address] [phone]  Main / Paging

## 2018-10-14 NOTE — H&P (Signed)
Angela Mcconnell DOB: 10/22/1961  Patient Care Team: Leighton Ruff, MD as PCP - General (Family Medicine) Michael Boston, MD as Consulting Physician (General Surgery) Nunzio Cobbs, MD as Consulting Physician (Obstetrics and Gynecology) Clarene Essex, MD as Consulting Physician (Gastroenterology)  Marland Kitchen ` Patient sent for surgical consultation at the request of Dr Clarene Essex  Chief Complaint: Worsening heartburn/reflux. Hiatal hernia. ` ` The patient is a pleasant woman that struggle with heartburn for a decade. Got to the point of taking Tums and Rolaids several times a day. Worsened. Establish with primary and gastroenterology. Started on Protonix. Now twice a day. It helps somewhat but she still has issues of heartburn. Lately she's been having episodes of food sticking. Occasionally having to spit up foods. Happening more often. Dysphagia to salads and other solid foods especially. Occasionally liquids if she drinks too quickly but not too severe. She is avoiding carbonated fractures since she cannot tolerate those anymore. Followed by Dr. Watt Climes with [facility] gastroenterology. He did get EGD on her last month. Apparently confirmed hiatal hernia. Looking the CT scan of her chest done 5 years ago for chest pain, I wonder if she had a small one then as well. She had extensive negative cardiac workup for episodes of chest pain. Not consistent with very colic. She does not smoke. She is not diabetic. She had a hysterectomy but no other surgeries. She walks a mile or 2 without difficulty. She's been working aggressively with physical tremor and has intentionally lost about 30 pounds the past year but feels frustrated that she can lose more. I looked and weight loss. Surgery was told she is a little too thin to consider that. She is worried about staying on proton pump inhibitors indefinitely. She would like to get off of that and consider surgery. Therefore  consultation requested  She had episode of chest pain in 2014 with an ink negative workup. Clean cardiac catheterization. Angiography of the chest underwhelming. Small hiatal hernia seems apparent to my view at that time.  (Review of systems as stated in this history (HPI) or in the review of systems. Otherwise all other 12 point ROS are negative) ` ` `   Vitals ([name] CMA; 06/29/2018 3:05 PM) 06/29/2018 3:04 PM Weight: 195.2 lb Height: 63in Body Surface Area: 1.91 m Body Mass Index: 34.58 kg/m  Pulse: 94 (Regular)  BP: 132/80 (Sitting, Left Arm, Standard)      Physical Exam [name] MD; 06/29/2018 3:31 PM)  General Mental Status-Alert. General Appearance-Not in acute distress, Not Sickly. Orientation-Oriented X3. Hydration-Well hydrated. Voice-Normal.  Integumentary Global Assessment Upon inspection and palpation of skin surfaces of the - Axillae: non-tender, no inflammation or ulceration, no drainage. and Distribution of scalp and body hair is normal. General Characteristics Temperature - normal warmth is noted.  Head and Neck Head-normocephalic, atraumatic with no lesions or palpable masses. Face Global Assessment - atraumatic, no absence of expression. Neck Global Assessment - no abnormal movements, no bruit auscultated on the right, no bruit auscultated on the left, no decreased range of motion, non-tender. Trachea-midline. Thyroid Gland Characteristics - non-tender.  Eye Eyeball - Left-Extraocular movements intact, No Nystagmus. Eyeball - Right-Extraocular movements intact, No Nystagmus. Cornea - Left-No Hazy. Cornea - Right-No Hazy. Sclera/Conjunctiva - Left-No scleral icterus, No Discharge. Sclera/Conjunctiva - Right-No scleral icterus, No Discharge. Pupil - Left-Direct reaction to light normal. Pupil - Right-Direct reaction to light normal.  ENMT Ears Pinna - Left - no drainage  observed, no generalized  tenderness observed. Right - no drainage observed, no generalized tenderness observed. Nose and Sinuses External Inspection of the Nose - no destructive lesion observed. Inspection of the nares - Left - quiet respiration. Right - quiet respiration. Mouth and Throat Lips - Upper Lip - no fissures observed, no pallor noted. Lower Lip - no fissures observed, no pallor noted. Nasopharynx - no discharge present. Oral Cavity/Oropharynx - Tongue - no dryness observed. Oral Mucosa - no cyanosis observed. Hypopharynx - no evidence of airway distress observed.  Chest and Lung Exam Inspection Movements - Normal and Symmetrical. Accessory muscles - No use of accessory muscles in breathing. Palpation Palpation of the chest reveals - Non-tender. Auscultation Breath sounds - Normal and Clear.  Cardiovascular Auscultation Rhythm - Regular. Murmurs & Other Heart Sounds - Auscultation of the heart reveals - No Murmurs and No Systolic Clicks.  Abdomen Inspection Inspection of the abdomen reveals - No Visible peristalsis and No Abnormal pulsations. Umbilicus - No Bleeding, No Urine drainage. Palpation/Percussion Palpation and Percussion of the abdomen reveal - Soft, Non Tender, No Rebound tenderness, No Rigidity (guarding) and No Cutaneous hyperesthesia. Note: Abdomen overweight but soft. Not distended. No distasis recti. No umbilical or other anterior abdominal wall hernias  Female Genitourinary Sexual Maturity Tanner 5 - Adult hair pattern. Note: No vaginal bleeding nor discharge  Peripheral Vascular Upper Extremity Inspection - Left - No Cyanotic nailbeds, Not Ischemic. Right - No Cyanotic nailbeds, Not Ischemic.  Neurologic Neurologic evaluation reveals -normal attention span and ability to concentrate, able to name objects and repeat phrases. Appropriate fund of knowledge , normal sensation and normal coordination. Mental Status Affect - not angry, not  paranoid. Cranial Nerves-Normal Bilaterally. Gait-Normal.  Neuropsychiatric Mental status exam performed with findings of-able to articulate well with normal speech/language, rate, volume and coherence, thought content normal with ability to perform basic computations and apply abstract reasoning and no evidence of hallucinations, delusions, obsessions or homicidal/suicidal ideation.  Musculoskeletal Global Assessment Spine, Ribs and Pelvis - no instability, subluxation or laxity. Right Upper Extremity - no instability, subluxation or laxity.  Lymphatic Head & Neck  General Head & Neck Lymphatics: Bilateral - Description - No Localized lymphadenopathy. Axillary  General Axillary Region: Bilateral - Description - No Localized lymphadenopathy. Femoral & Inguinal  Generalized Femoral & Inguinal Lymphatics: Left - Description - No Localized lymphadenopathy. Right - Description - No Localized lymphadenopathy.    Assessment & Plan   PARAESOPHAGEAL HIATAL HERNIA (K44.9) Impression: Patient with known hiatal hernia based on EGD and suspected on CT scan 5 years ago. Worsening episodes of dysphagia heartburn and reflux despite an antacid medication and gastroenterology supervision. She strongly wishes to avoid being on proton pump inhibitors indefinitely since she suspects friends have had issues with that after being on it for decades.  Dysphagia seems to be happening more often and worsening. EGD done last month without any endoluminal masses. Would like to actually look at the report myself.  Gastric emptying study shows normal emptying, arguing against a primary gastric disorder.  She is are had cardiac workup ruling out any cardiac issues. Good exercise tolerance. No worsening pulmonary symptoms. No hepatopancreatic/ biliary symptoms.  Esophageal manometry showed normal motility.  I instincts is that she would benefit from a hiatal hernia repair. Minimally  invasive approach. Daily vitamin his robotically. Probably use absorbable mesh reinforcement to decrease recurrence. Fundoplication. Manometry quelled me tailor whether I can do a complete versus partial fundoplication.  Another option is to consider weight loss bariatric surgery. She  didn't do that and was told that she is not a candidate for that since her BMI is less than 35 and she has no other comorbidities.. She's intentionally lost 30 pounds of weight and would like to hold off on that if possible. Don't know how durable the situation be a hiatal hernia if it is actually large.  The anatomy & physiology of the foregut and anti-reflux mechanism was discussed. The pathophysiology of hiatal herniation and GERD was discussed. Natural history risks without surgery was discussed. The patient's symptoms are not adequately controlled by medicines and other non-operative treatments. I feel the risks of no intervention will lead to serious problems that outweigh the operative risks; therefore, I recommended surgery to reduce the hiatal hernia out of the chest and fundoplication to rebuild the anti-reflux valve and control reflux better. Need for a thorough workup to rule out the differential diagnosis and plan treatment was explained. I explained laparoscopic techniques with possible need for an open approach.  Risks such as bleeding, infection, abscess, leak, need for further treatment, heart attack, death, and other risks were discussed. I noted a good likelihood this will help address the problem. Goals of post-operative recovery were discussed as well. Possibility that this will not correct all symptoms was explained. Post-operative dysphagia, need for short-term liquid & pureed diet, inability to vomit, possibility of reherniation, possible need for medicines to help control symptoms in addition to surgery were discussed. We will work to minimize complications. Educational handouts further  explaining the pathology, treatment options, and dysphagia diet was given as well. Questions were answered. The patient expresses understanding & wishes to proceed with surgery.  [name], MD, FACS, MASCRS Gastrointestinal and Minimally Invasive Surgery    [address] [place], [place] [address] [phone] Main / Paging [phone] Fax

## 2018-10-15 ENCOUNTER — Encounter (HOSPITAL_COMMUNITY): Payer: Self-pay | Admitting: Surgery

## 2018-10-15 ENCOUNTER — Observation Stay (HOSPITAL_COMMUNITY): Payer: BLUE CROSS/BLUE SHIELD

## 2018-10-15 MED ORDER — SODIUM CHLORIDE 0.9 % IV SOLN
250.0000 mL | INTRAVENOUS | Status: DC | PRN
  Administered 2018-10-17: 250 mL via INTRAVENOUS

## 2018-10-15 MED ORDER — PROMETHAZINE HCL 25 MG RE SUPP: 25.0000 mg | Freq: Four times a day (QID) | RECTAL | 10 refills | Status: DC | PRN

## 2018-10-15 MED ORDER — FUROSEMIDE 10 MG/ML IJ SOLN
40.0000 mg | Freq: Once | INTRAMUSCULAR | Status: AC
  Administered 2018-10-15: 40 mg via INTRAVENOUS
  Filled 2018-10-15: qty 4

## 2018-10-15 MED ORDER — PANTOPRAZOLE SODIUM 40 MG PO TBEC: 40.0000 mg | DELAYED_RELEASE_TABLET | Freq: Every day | ORAL | 1 refills | Status: DC

## 2018-10-15 MED ORDER — ONDANSETRON HCL 4 MG PO TABS: 4.0000 mg | ORAL_TABLET | Freq: Three times a day (TID) | ORAL | 10 refills | Status: DC | PRN

## 2018-10-15 MED ORDER — SIMETHICONE 80 MG PO CHEW
80.0000 mg | CHEWABLE_TABLET | Freq: Four times a day (QID) | ORAL | Status: AC
  Administered 2018-10-15 (×3): 80 mg via ORAL
  Filled 2018-10-15 (×3): qty 1

## 2018-10-15 MED ORDER — DEXAMETHASONE SODIUM PHOSPHATE 4 MG/ML IJ SOLN
4.0000 mg | Freq: Two times a day (BID) | INTRAMUSCULAR | Status: AC
  Administered 2018-10-15 – 2018-10-17 (×6): 4 mg via INTRAVENOUS
  Filled 2018-10-15 (×6): qty 1

## 2018-10-15 MED ORDER — SODIUM CHLORIDE 0.9% FLUSH
3.0000 mL | Freq: Two times a day (BID) | INTRAVENOUS | Status: DC
  Administered 2018-10-15 (×2): 3 mL via INTRAVENOUS

## 2018-10-15 MED ORDER — ACETAMINOPHEN 500 MG PO TABS
1000.0000 mg | ORAL_TABLET | Freq: Four times a day (QID) | ORAL | Status: DC
  Administered 2018-10-15 – 2018-10-18 (×5): 1000 mg via ORAL
  Filled 2018-10-15 (×7): qty 2

## 2018-10-15 MED ORDER — SODIUM CHLORIDE 0.9% FLUSH: 3.0000 mL | INTRAVENOUS | Status: DC | PRN

## 2018-10-15 NOTE — Progress Notes (Signed)
Angela Mcconnell [id number] Jul 03, 1961  CARE TEAM:  PCP: Leighton Ruff, MD  Outpatient Care Team: Patient Care Team: Leighton Ruff, MD as PCP - General (Family Medicine) Michael Boston, MD as Consulting Physician (General Surgery) Yisroel Ramming, Everardo All, MD as Consulting Physician (Obstetrics and Gynecology) Clarene Essex, MD as Consulting Physician (Gastroenterology)  Inpatient Treatment Team: Treatment Team: Attending Provider: Michael Boston, MD; Registered Nurse: Lolita Cram, RN; Technician: Margarita Sermons, NT; Charge Nurse: Abigail Miyamoto, RN; Registered Nurse: Jennye Boroughs, RN; Technician: Sharren Bridge, NT   Problem List:   Principal Problem:   Hiatal hernia with GERD and esophagitis s/p robotic repair & Nissen fundoplication 49/67/5916 Active Problems:   Asthma   Status post Nissen fundoplication 38/46/6599   1 Day Post-Op  10/14/2018  POST-OPERATIVE DIAGNOSIS:  PARAESOPHAGEAL HIATAL HERNIA  PROCEDURE:   1. ROBOTIC reduction of paraesophageal hiatal hernia 2. Type II mediastinal dissection. 3. Primary repair of hiatal hernia over pledgets.  4. Posterior gastropexy. 5. Nissen fundoplication 2 cm over a 54-French bougie 6. Mesh reinforcement with absorbable mesh  SURGEON:  [name], MD  Hospital Stay = 0 days  Assessment  OK  Plan:  -Esophagram this morning.  If no strong evidence of leak or obstruction, advance to dysphagia 1 pured diet.  Standing simethicone to help with bloatedness.  Some substernal and left shoulder discomfort most likely referred pain from the diaphragmatic hiatal hernia closure.  Should improve.  Continue standing acetaminophen/gabapentin.  Narcotics for breakthrough pain as needed.  Muscle relaxants.   -VTE prophylaxis- SCDs, etc -mobilize as tolerated to help recovery  20 minutes spent in review, evaluation, examination, counseling, and coordination of care.  More than 50% of that time was  spent in counseling.  10/15/2018    Subjective: (Chief complaint)  Soreness at mid chest/back/left shoulder with deep breaths.  Walked in the hallways x2.  Tolerating clear liquids relatively well.  Was rather nauseated yesterday and retched.  That made her chest soreness worse.  Getting under control now.  Objective:  Vital signs:  Vitals:   10/14/18 1607 10/14/18 2028 10/15/18 0203 10/15/18 0602  BP: (!) 155/87 (!) 154/94 (!) 161/91 (!) 168/92  Pulse: 99 (!) 108 (!) 117 (!) 111  Resp: 18 18 18 18   Temp: 97.8 F (36.6 C) 98.6 F (37 C) 98.8 F (37.1 C) 99.1 F (37.3 C)  TempSrc: Oral Oral Oral Oral  SpO2: 100% 100% 98% 100%  Weight:      Height:        Last BM Date: 10/14/18  Intake/Output   Yesterday:  11/13 0701 - 11/14 0700 In: 2862 [P.O.:960; I.V.:1702; IV Piggyback:200] Out: 3570 [Urine:4000; Blood:75] This shift:  No intake/output data recorded.  Bowel function:  Flatus: YES  BM:  No  Drain: (No drain)   Physical Exam:  General: Pt awake/alert/oriented x4 in no acute distress Eyes: PERRL, normal EOM.  Sclera clear.  No icterus Neuro: CN II-XII intact w/o focal sensory/motor deficits. Lymph: No head/neck/groin lymphadenopathy Psych:  No delerium/psychosis/paranoia HENT: Normocephalic, Mucus membranes moist.  No thrush Neck: Supple, No tracheal deviation Chest: No chest wall pain w good excursion CV:  Pulses intact.  Regular rhythm MS: Normal AROM mjr joints.  No obvious deformity  Abdomen: Soft.  Nondistended.  Mildly tender at incisions only.  No evidence of peritonitis.  No incarcerated hernias.  Ext:   No deformity.  No mjr edema.  No cyanosis Skin: No petechiae / purpura  Results:  Labs: No results found for this or any previous visit (from the past 48 hour(s)).  Imaging / Studies: No results found.  Medications / Allergies: per chart  Antibiotics: Anti-infectives (From admission, onward)   Start     Dose/Rate Route  Frequency Ordered Stop   10/14/18 0830  cefTRIAXone (ROCEPHIN) 2 g in sodium chloride 0.9 % 100 mL IVPB     2 g 200 mL/hr over 30 Minutes Intravenous On call to O.R. 10/14/18 0816 10/14/18 0857   10/14/18 0830  metroNIDAZOLE (FLAGYL) IVPB 500 mg     500 mg 100 mL/hr over 60 Minutes Intravenous  Once 10/14/18 0816 10/14/18 0908        Note: Portions of this report may have been transcribed using voice recognition software. Every effort was made to ensure accuracy; however, inadvertent computerized transcription errors may be present.   Any transcriptional errors that result from this process are unintentional.     [name], MD, FACS, MASCRS Gastrointestinal and Minimally Invasive Surgery    [address], [place], [place] [address] [phone] Main / Paging [phone] Fax

## 2018-10-15 NOTE — Plan of Care (Signed)
Nutrition Education Note  RD consulted for nutrition education regarding nutrition management for dysphagia; pureed diet s/p gastric fundoplication.   RD provided "National Dysphagia Diet, Pureed" handout from the Academy of [employer]etics. Reviewed patient's dietary recall and discussed ways for pt to meet nutrition goals over the next several weeks. Explained reasons for pt to follow a pureed diet over the next 2-3 weeks.   Teach back method used. Pt verbalizes understanding of information provided.   Expect excellent compliance.  Body mass index is Body mass index is 32.99 kg/m.Marland Kitchen Pt meets criteria for obesity; unclassified based on current BMI.  Current diet order is clear liquid, patient is consuming approximately 80%of meals at this time. Labs and medications reviewed. No further nutrition interventions warranted at this time. RD contact information provided. If additional nutrition issues arise, please re-consult RD.  Lajuan Lines, RD, LDN  After Hours/Weekend Pager: [phone]

## 2018-10-16 DIAGNOSIS — Z6834 Body mass index (BMI) 34.0-34.9, adult: Secondary | ICD-10-CM | POA: Diagnosis not present

## 2018-10-16 DIAGNOSIS — E669 Obesity, unspecified: Secondary | ICD-10-CM | POA: Diagnosis present

## 2018-10-16 DIAGNOSIS — K449 Diaphragmatic hernia without obstruction or gangrene: Secondary | ICD-10-CM | POA: Diagnosis present

## 2018-10-16 DIAGNOSIS — Z79899 Other long term (current) drug therapy: Secondary | ICD-10-CM | POA: Diagnosis not present

## 2018-10-16 DIAGNOSIS — R1310 Dysphagia, unspecified: Secondary | ICD-10-CM | POA: Diagnosis present

## 2018-10-16 DIAGNOSIS — J45909 Unspecified asthma, uncomplicated: Secondary | ICD-10-CM | POA: Diagnosis present

## 2018-10-16 DIAGNOSIS — D62 Acute posthemorrhagic anemia: Secondary | ICD-10-CM | POA: Diagnosis not present

## 2018-10-16 DIAGNOSIS — K210 Gastro-esophageal reflux disease with esophagitis: Secondary | ICD-10-CM | POA: Diagnosis present

## 2018-10-16 LAB — CBC
HCT: 25.1 % — ABNORMAL LOW (ref 36.0–46.0)
Hemoglobin: 7.9 g/dL — ABNORMAL LOW (ref 12.0–15.0)
MCH: 29.7 pg (ref 26.0–34.0)
MCHC: 31.5 g/dL (ref 30.0–36.0)
MCV: 94.4 fL (ref 80.0–100.0)
Platelets: 255 10*3/uL (ref 150–400)
RBC: 2.66 MIL/uL — ABNORMAL LOW (ref 3.87–5.11)
RDW: 15.1 % (ref 11.5–15.5)
WBC: 21.5 10*3/uL — ABNORMAL HIGH (ref 4.0–10.5)
nRBC: 0 % (ref 0.0–0.2)

## 2018-10-16 LAB — BASIC METABOLIC PANEL
Anion gap: 12 (ref 5–15)
BUN: 28 mg/dL — ABNORMAL HIGH (ref 6–20)
CO2: 22 mmol/L (ref 22–32)
Calcium: 8.8 mg/dL — ABNORMAL LOW (ref 8.9–10.3)
Chloride: 104 mmol/L (ref 98–111)
Creatinine, Ser: 1.73 mg/dL — ABNORMAL HIGH (ref 0.44–1.00)
GFR calc Af Amer: 37 mL/min — ABNORMAL LOW (ref >60)
GFR calc non Af Amer: 32 mL/min — ABNORMAL LOW (ref >60)
Glucose, Bld: 137 mg/dL — ABNORMAL HIGH (ref 70–99)
Potassium: 4.3 mmol/L (ref 3.5–5.1)
Sodium: 138 mmol/L (ref 135–145)

## 2018-10-16 LAB — MAGNESIUM: Magnesium: 2 mg/dL (ref 1.7–2.4)

## 2018-10-16 MED ORDER — LACTATED RINGERS IV BOLUS: 1000.0000 mL | Freq: Three times a day (TID) | INTRAVENOUS | Status: DC | PRN

## 2018-10-16 MED ORDER — METOPROLOL TARTRATE 12.5 MG HALF TABLET: 12.5000 mg | ORAL_TABLET | Freq: Two times a day (BID) | ORAL | Status: DC

## 2018-10-16 MED ORDER — ENSURE SURGERY PO LIQD
237.0000 mL | Freq: Two times a day (BID) | ORAL | Status: DC
  Administered 2018-10-16 – 2018-10-17 (×4): 237 mL via ORAL
  Filled 2018-10-16 (×6): qty 237

## 2018-10-16 MED ORDER — METOPROLOL TARTRATE 5 MG/5ML IV SOLN: 5.0000 mg | Freq: Four times a day (QID) | INTRAVENOUS | Status: DC | PRN

## 2018-10-16 MED ORDER — LACTATED RINGERS IV BOLUS
1000.0000 mL | Freq: Once | INTRAVENOUS | Status: AC
  Administered 2018-10-16: 1000 mL via INTRAVENOUS

## 2018-10-16 MED ORDER — LACTATED RINGERS IV SOLN: 1000.0000 mL | Freq: Three times a day (TID) | INTRAVENOUS | Status: DC | PRN

## 2018-10-16 MED ORDER — HYDRALAZINE HCL 20 MG/ML IJ SOLN: 5.0000 mg | INTRAMUSCULAR | Status: DC | PRN

## 2018-10-16 NOTE — Progress Notes (Signed)
Angela Mcconnell [id number] 25-Mar-1961  CARE TEAM:  PCP: Leighton Ruff, MD  Outpatient Care Team: Patient Care Team: Leighton Ruff, MD as PCP - General (Family Medicine) Michael Boston, MD as Consulting Physician (General Surgery) Nunzio Cobbs, MD as Consulting Physician (Obstetrics and Gynecology) Clarene Essex, MD as Consulting Physician (Gastroenterology)  Inpatient Treatment Team: Treatment Team: Attending Provider: Michael Boston, MD; Technician: Margarita Sermons, NT; Technician: Sharren Bridge, NT; Technician: Abbe Amsterdam, NT   Problem List:   Principal Problem:   Hiatal hernia with GERD and esophagitis s/p robotic repair & Nissen fundoplication 87/56/4332 Active Problems:   Chest pain at rest   Asthma   Status post Nissen fundoplication 95/18/8416   Obesity, Class I, BMI 30-34.9   2 Days Post-Op  10/14/2018  POST-OPERATIVE DIAGNOSIS:  PARAESOPHAGEAL HIATAL HERNIA  PROCEDURE:   1. ROBOTIC reduction of paraesophageal hiatal hernia 2. Type II mediastinal dissection. 3. Primary repair of hiatal hernia over pledgets.  4. Posterior gastropexy. 5. Nissen fundoplication 2 cm over a 54-French bougie 6. Mesh reinforcement with absorbable mesh  SURGEON:  [name], MD  Hospital Stay = 0 days  Assessment  OK  Plan:  -Esophagram with some edema but no leak or obstruction.  I did trial of diuresis and steroids.  She may have gotten a little too dehydrated.  Tachycardia after walking.  Will give her an IV fluid bolus.    Check labs.  Retry p.o.  Likely she will need to stay on thin a few days before advancing her diet.   Overall she feels much better and wishes to go home.  I would like to make sure she can tolerate enough liquids and her labs are okay first.  Hopefully home later today versus stay another day or so.  We will see.    VTE prophylaxis- SCDs, etc  Mobilize as tolerated to help recovery  D/C patient from  hospital when patient meets criteria (anticipate in 0-1 day(s)):  Tolerating oral intake well Ambulating well Adequate pain control without IV medications Urinating  Having flatus Disposition planning in place   20 minutes spent in review, evaluation, examination, counseling, and coordination of care.  More than 50% of that time was spent in counseling.  10/16/2018    Subjective: (Chief complaint)  Chest and shoulder soreness is gone down.  Esophagram showed some edema but no major obstruction or leak.  Tolerating some liquids.  Tachycardic and lightheaded after walking.  Wants to go home.  Objective:  Vital signs:  Vitals:   10/15/18 1554 10/15/18 2116 10/16/18 0009 10/16/18 0633  BP: (!) 92/59 (!) 86/65 101/60 108/71  Pulse: (!) 138 (!) 140 (!) 110 (!) 120  Resp:  18 18 20   Temp: 97.6 F (36.4 C) (!) 97.4 F (36.3 C) 98.6 F (37 C) 98.7 F (37.1 C)  TempSrc: Oral Oral Oral Oral  SpO2: 100% 100% 100% 98%  Weight:      Height:        Last BM Date: 10/14/18  Intake/Output   Yesterday:  11/14 0701 - 11/15 0700 In: 1862.5 [P.O.:120; I.V.:1742.5] Out: 1500 [Urine:1500] This shift:  Total I/O In: -  Out: 400 [Urine:400]  Bowel function:  Flatus: YES  BM:  No  Drain: (No drain)   Physical Exam:  General: Pt awake/alert/oriented x4 in no acute distress Eyes: PERRL, normal EOM.  Sclera clear.  No icterus Neuro: CN II-XII intact w/o focal sensory/motor deficits. Lymph: No head/neck/groin lymphadenopathy  Psych:  No delerium/psychosis/paranoia HENT: Normocephalic, Mucus membranes moist.  No thrush Neck: Supple, No tracheal deviation Chest: No chest wall pain w good excursion CV:  Pulses intact.  Regular rhythm MS: Normal AROM mjr joints.  No obvious deformity  Abdomen: Soft.  Nondistended.  Mildly tender at incisions only.  No evidence of peritonitis.  No incarcerated hernias.  Ext:   No deformity.  No mjr edema.  No cyanosis Skin: No petechiae  / purpura  Results:   Labs: No results found for this or any previous visit (from the past 48 hour(s)).  Imaging / Studies: Dg Esophagus W/water Sol Cm  Result Date: 10/15/2018 CLINICAL DATA:  Postop Nissen fundoplication. Difficulty swallowing with mid chest discomfort. EXAM: ESOPHOGRAM/BARIUM SWALLOW TECHNIQUE: Single contrast examination was performed using  water-soluble. FLUOROSCOPY TIME:  Fluoroscopy Time:  0 minutes 54 seconds Radiation Exposure Index (if provided by the fluoroscopic device): 18 mGy Number of Acquired Spot Images: 2 COMPARISON:  None. FINDINGS: Esophagus is dilated and may contain mild food debris. Nissen fundoplication with a narrow channel of transit between the distal esophagus and proximal stomach. Patient experienced discomfort with swallowing. No leak. IMPRESSION: Nissen fundoplication with a narrow channel transit between the distal esophagus and proximal stomach. Associated esophageal dilatation. Findings may be due to postoperative edema. Please correlate clinically. No leak. Electronically Signed   By: Lorin Picket M.D.   On: 10/15/2018 09:25    Medications / Allergies: per chart  Antibiotics: Anti-infectives (From admission, onward)   Start     Dose/Rate Route Frequency Ordered Stop   10/14/18 0830  cefTRIAXone (ROCEPHIN) 2 g in sodium chloride 0.9 % 100 mL IVPB     2 g 200 mL/hr over 30 Minutes Intravenous On call to O.R. 10/14/18 0816 10/14/18 0857   10/14/18 0830  metroNIDAZOLE (FLAGYL) IVPB 500 mg     500 mg 100 mL/hr over 60 Minutes Intravenous  Once 10/14/18 0816 10/14/18 0908        Note: Portions of this report may have been transcribed using voice recognition software. Every effort was made to ensure accuracy; however, inadvertent computerized transcription errors may be present.   Any transcriptional errors that result from this process are unintentional.     [name], MD, FACS, MASCRS Gastrointestinal and Minimally Invasive  Surgery    [address] [place], [place] [address] [phone] Main / Paging [phone] Fax

## 2018-10-16 NOTE — Plan of Care (Signed)
Patient in bed this morning with no complaints or concerns voiced. No pain at present. Will continue to monitor.

## 2018-10-17 LAB — HEMOGLOBIN: Hemoglobin: 6.6 g/dL — CL (ref 12.0–15.0)

## 2018-10-17 LAB — CREATININE, SERUM
Creatinine, Ser: 0.92 mg/dL (ref 0.44–1.00)
GFR calc Af Amer: >60 mL/min (ref >60)
GFR calc non Af Amer: >60 mL/min (ref >60)

## 2018-10-17 LAB — PREPARE RBC (CROSSMATCH)

## 2018-10-17 LAB — POTASSIUM: Potassium: 4.5 mmol/L (ref 3.5–5.1)

## 2018-10-17 LAB — ABO/RH: ABO/RH(D): A POS

## 2018-10-17 MED ORDER — SODIUM CHLORIDE 0.9% IV SOLUTION: Freq: Once | INTRAVENOUS | Status: DC

## 2018-10-17 NOTE — Progress Notes (Signed)
3 Days Post-Op   Subjective/Chief Complaint: No complaints   Objective: Vital signs in last 24 hours: Temp:  [98.5 F (36.9 C)-98.9 F (37.2 C)] 98.7 F (37.1 C) (11/16 0622) Pulse Rate:  [100-118] 105 (11/16 0635) Resp:  [17-18] 18 (11/16 0635) BP: (109-131)/(63-83) 112/81 (11/16 0635) SpO2:  [98 %-100 %] 100 % (11/16 0635) Last BM Date: 10/14/18  Intake/Output from previous day: 11/15 0701 - 11/16 0700 In: 2767.3 [P.O.:2280; I.V.:487.3] Out: 2800 [Urine:2800] Intake/Output this shift: Total I/O In: -  Out: 225 [Urine:225]  General appearance: alert and cooperative Resp: clear to auscultation bilaterally Cardio: regular rate and rhythm GI: soft, non-tender; bowel sounds normal; no masses,  no organomegaly  Lab Results:  Recent Labs    10/16/18 0828 10/17/18 0332  WBC 21.5*  --   HGB 7.9* 6.6*  HCT 25.1*  --   PLT 255  --    BMET Recent Labs    10/16/18 0828 10/17/18 0332  NA 138  --   K 4.3 4.5  CL 104  --   CO2 22  --   GLUCOSE 137*  --   BUN 28*  --   CREATININE 1.73* 0.92  CALCIUM 8.8*  --    PT/INR No results for input(s): LABPROT, INR in the last 72 hours. ABG No results for input(s): PHART, HCO3 in the last 72 hours.  Invalid input(s): PCO2, PO2  Studies/Results: No results found.  Anti-infectives: Anti-infectives (From admission, onward)   Start     Dose/Rate Route Frequency Ordered Stop   10/14/18 0830  cefTRIAXone (ROCEPHIN) 2 g in sodium chloride 0.9 % 100 mL IVPB     2 g 200 mL/hr over 30 Minutes Intravenous On call to O.R. 10/14/18 0816 10/14/18 0857   10/14/18 0830  metroNIDAZOLE (FLAGYL) IVPB 500 mg     500 mg 100 mL/hr over 60 Minutes Intravenous  Once 10/14/18 0816 10/14/18 0908      Assessment/Plan: s/p Procedure(s): XI ROBOTIC REPAIR PARAESOPHAGEAL HIATAL HERNIA WITH FUNDOPLICATION (N/A) INSERTION OF MESH (N/A) Advance diet  Hg 6.6. Since she required fluid yesterday I would favor transfusion. She agrees. If hg  stable overnight then maybe d/c tomorrow  LOS: 1 day    Angela Mcconnell III 10/17/2018

## 2018-10-17 NOTE — Plan of Care (Signed)
Patient in bed this morning; no complaints of pain at this time. Understands she is going to be receiving 2 units PRBC today once type and cross completed. Will continue to monitor.

## 2018-10-17 NOTE — Progress Notes (Signed)
Hemoglobin 6.6. Paged Dr. Marlou Starks.

## 2018-10-18 LAB — CBC
HCT: 30.8 % — ABNORMAL LOW (ref 36.0–46.0)
Hemoglobin: 10.2 g/dL — ABNORMAL LOW (ref 12.0–15.0)
MCH: 30.2 pg (ref 26.0–34.0)
MCHC: 33.1 g/dL (ref 30.0–36.0)
MCV: 91.1 fL (ref 80.0–100.0)
Platelets: 221 10*3/uL (ref 150–400)
RBC: 3.38 MIL/uL — ABNORMAL LOW (ref 3.87–5.11)
RDW: 14.6 % (ref 11.5–15.5)
WBC: 16 10*3/uL — ABNORMAL HIGH (ref 4.0–10.5)
nRBC: 0.1 % (ref 0.0–0.2)

## 2018-10-18 NOTE — Progress Notes (Signed)
4 Days Post-Op   Subjective/Chief Complaint: Feels fine. Hg up to 10.2. Wants to go home   Objective: Vital signs in last 24 hours: Temp:  [98.3 F (36.8 C)-99.3 F (37.4 C)] 98.3 F (36.8 C) (11/17 0606) Pulse Rate:  [90-120] 90 (11/17 0606) Resp:  [16-18] 18 (11/17 0606) BP: (129-146)/(79-91) 135/83 (11/17 0606) SpO2:  [95 %-100 %] 98 % (11/17 0606) Last BM Date: 10/17/18  Intake/Output from previous day: 11/16 0701 - 11/17 0700 In: 4222.3 [P.O.:2560; I.V.:297.3; Blood:1365] Out: 1725 [ZOXWR:6045] Intake/Output this shift: No intake/output data recorded.  General appearance: alert and cooperative Resp: clear to auscultation bilaterally Cardio: regular rate and rhythm GI: soft, non-tender; bowel sounds normal; no masses,  no organomegaly  Lab Results:  Recent Labs    10/16/18 0828 10/17/18 0332 10/18/18 0421  WBC 21.5*  --  16.0*  HGB 7.9* 6.6* 10.2*  HCT 25.1*  --  30.8*  PLT 255  --  221   BMET Recent Labs    10/16/18 0828 10/17/18 0332  NA 138  --   K 4.3 4.5  CL 104  --   CO2 22  --   GLUCOSE 137*  --   BUN 28*  --   CREATININE 1.73* 0.92  CALCIUM 8.8*  --    PT/INR No results for input(s): LABPROT, INR in the last 72 hours. ABG No results for input(s): PHART, HCO3 in the last 72 hours.  Invalid input(s): PCO2, PO2  Studies/Results: No results found.  Anti-infectives: Anti-infectives (From admission, onward)   Start     Dose/Rate Route Frequency Ordered Stop   10/14/18 0830  cefTRIAXone (ROCEPHIN) 2 g in sodium chloride 0.9 % 100 mL IVPB     2 g 200 mL/hr over 30 Minutes Intravenous On call to O.R. 10/14/18 0816 10/14/18 0857   10/14/18 0830  metroNIDAZOLE (FLAGYL) IVPB 500 mg     500 mg 100 mL/hr over 60 Minutes Intravenous  Once 10/14/18 0816 10/14/18 0908      Assessment/Plan: s/p Procedure(s): XI ROBOTIC REPAIR PARAESOPHAGEAL HIATAL HERNIA WITH FUNDOPLICATION (N/A) INSERTION OF MESH (N/A) Advance diet Discharge  LOS: 2 days     Autumn Messing III 10/18/2018

## 2018-10-18 NOTE — Progress Notes (Signed)
I have reviewed and concur with this student's documentation.   

## 2018-10-18 NOTE — Plan of Care (Signed)
Reviewed D/C instructions with patient; copy given. IV removed. Patient ready for discharge

## 2018-10-19 LAB — BPAM RBC
Blood Product Expiration Date: 201912102359
Blood Product Expiration Date: 201912102359
ISSUE DATE / TIME: 201911161336
ISSUE DATE / TIME: 201911161822
Unit Type and Rh: 6200
Unit Type and Rh: 6200

## 2018-10-19 LAB — TYPE AND SCREEN
ABO/RH(D): A POS
Antibody Screen: NEGATIVE
Unit division: 0
Unit division: 0

## 2018-10-21 NOTE — Discharge Summary (Signed)
Physician Discharge Summary    Patient ID: Angela Mcconnell MRN: [id number] DOB/AGE: 1961-08-17  57 y.o.  Patient Care Team: Leighton Ruff, MD as PCP - General (Family Medicine) Michael Boston, MD as Consulting Physician (General Surgery) Nunzio Cobbs, MD as Consulting Physician (Obstetrics and Gynecology) Clarene Essex, MD as Consulting Physician (Gastroenterology)  Admit date: 10/14/2018  Discharge date: 10/18/2018 Hospital Stay = 2 days    Discharge Diagnoses:  Principal Problem:   Hiatal hernia with GERD and esophagitis s/p robotic repair & Nissen fundoplication 85/63/1497 Active Problems:   Chest pain at rest   Asthma   Status post Nissen fundoplication 02/63/7858   Obesity, Class I, BMI 30-34.9   7 Days Post-Op  10/14/2018  POST-OPERATIVE DIAGNOSIS:  PARAESOPHAGEAL HIATAL HERNIA  PROCEDURE:   1. ROBOTIC reduction of paraesophageal hiatal hernia 2. Type II mediastinal dissection. 3. Primary repair of hiatal hernia over pledgets.  4. Posterior gastropexy. 5. Nissen fundoplication 2 cm over a 54-French bougie 6. Mesh reinforcement with absorbable mesh  SURGEON:  [name], MD  Consults: None  Hospital Course:   The patient underwent the surgery above.  Postoperatively, the patient gradually mobilized and advanced to a Dysphagia 1 diet.  She did drop some hemoglobin with some concerning anemia stabilized.  Hematochezia or hematemesis.  Pain and other symptoms were treated aggressively.    By the time of discharge, the patient was walking well the hallways, eating food, having flatus.  Pain was well-controlled on an oral medications.  Based on meeting discharge criteria and continuing to recover, I felt it was safe for the patient to be discharged from the hospital to further recover with close followup. Postoperative recommendations were discussed in detail.  They are written as well.  Discharged Condition: good  Discharge Exam: Blood  pressure 135/83, pulse 90, temperature 98.3 F (36.8 C), temperature source Oral, resp. rate 18, height 5\' 3"  (1.6 m), weight 84.5 kg, SpO2 98 %.  General: Pt awake/alert/oriented x4 in No acute distress Eyes: PERRL, normal EOM.  Sclera clear.  No icterus Neuro: CN II-XII intact w/o focal sensory/motor deficits. Lymph: No head/neck/groin lymphadenopathy Psych:  No delerium/psychosis/paranoia HENT: Normocephalic, Mucus membranes moist.  No thrush Neck: Supple, No tracheal deviation Chest: No chest wall pain w good excursion CV:  Pulses intact.  Regular rhythm MS: Normal AROM mjr joints.  No obvious deformity Abdomen: Soft.  Nondistended.  Mildly tender at incisions only.  No evidence of peritonitis.  No incarcerated hernias. Ext:  SCDs BLE.  No mjr edema.  No cyanosis Skin: No petechiae / purpura   Disposition:   Follow-up Information    Michael Boston, MD. Schedule an appointment as soon as possible for a visit in 3 weeks.   Specialty:  General Surgery Why:  To follow up after your operation, To follow up after your hospital stay Contact information: [place] [address] [phone]             Discharge Instructions    Call MD for:  difficulty breathing, headache or visual disturbances   Complete by:  As directed    Call MD for:  extreme fatigue   Complete by:  As directed    Call MD for:  hives   Complete by:  As directed    Call MD for:  persistant dizziness or light-headedness   Complete by:  As directed    Call MD for:  persistant nausea and vomiting   Complete  by:  As directed    Call MD for:  redness, tenderness, or signs of infection (pain, swelling, redness, odor or green/yellow discharge around incision site)   Complete by:  As directed    Call MD for:  severe uncontrolled pain   Complete by:  As directed    Call MD for:  temperature >100.4   Complete by:  As directed    Diet - low sodium heart healthy   Complete by:  As  directed    Discharge instructions   Complete by:  As directed    May shower. No heavy lifting. Soft diet   Increase activity slowly   Complete by:  As directed    No wound care   Complete by:  As directed       Allergies as of 10/18/2018      Reactions   Sulfa Antibiotics Itching   Tape Itching   Causes burn marks    Clotrimazole    Made rash worse   Eggs Or Egg-derived Products Hives   mouth and throat itching   Hydrocodone Itching   Shellfish Allergy Hives   Mouth and throat itching      Medication List    TAKE these medications   B-12 PO Take 1 tablet by mouth daily.   clobetasol cream 0.05 % Commonly known as:  TEMOVATE Apply 1 application topically daily as needed for itching or dry skin.   cyclobenzaprine 10 MG tablet Commonly known as:  FLEXERIL Take 1 tablet (10 mg total) by mouth 2 (two) times daily as needed for muscle spasms. What changed:  when to take this   hydrochlorothiazide 12.5 MG capsule Commonly known as:  MICROZIDE Take 1 capsule (12.5 mg total) by mouth daily.   ibuprofen 200 MG tablet Commonly known as:  ADVIL,MOTRIN Take 400 mg by mouth daily as needed for headache or moderate pain.   Melatonin 5 MG Tabs Take 5 mg by mouth at bedtime as needed (sleep).   minoxidil 2.5 MG tablet Commonly known as:  LONITEN Take 2.5 mg by mouth daily.   naproxen 500 MG tablet Commonly known as:  NAPROSYN Take 1 tablet (500 mg total) by mouth 2 (two) times daily with a meal.   ondansetron 4 MG tablet Commonly known as:  ZOFRAN Take 1 tablet (4 mg total) by mouth every 8 (eight) hours as needed for nausea.   pantoprazole 40 MG tablet Commonly known as:  PROTONIX Take 1 tablet (40 mg total) by mouth daily. What changed:  when to take this   promethazine 25 MG suppository Commonly known as:  PHENERGAN Place 1 suppository (25 mg total) rectally every 6 (six) hours as needed for nausea.   Vitamin D3 125 MCG (5000 UT) Caps Take 10,000 Units by  mouth daily.       Significant Diagnostic Studies:  No results found for this or any previous visit (from the past 72 hour(s)).  No results found.  Past Medical History:  Diagnosis Date  . Arthritis   . Asthma   . Palpitations   . Shortness of breath     Past Surgical History:  Procedure Laterality Date  . ABDOMINAL HYSTERECTOMY    . CORONARY ANGIOGRAM  Nov 2010   Normal coronary arteries  . ESOPHAGEAL MANOMETRY N/A 08/12/2018   Procedure: ESOPHAGEAL MANOMETRY (EM);  Surgeon: Clarene Essex, MD;  Location: [facility];  Service: Endoscopy;  Laterality: N/A;  . INSERTION OF MESH N/A 10/14/2018   Procedure: INSERTION OF MESH;  Surgeon: Michael Boston, MD;  Location: [facility];  Service: General;  Laterality: N/A;  . KNEE SURGERY      Social History   Socioeconomic History  . Marital status: Significant Other    Spouse name: Not on file  . Number of children: Not on file  . Years of education: Not on file  . Highest education level: Not on file  Occupational History  . Not on file  Social Needs  . Financial resource strain: Not on file  . Food insecurity:    Worry: Not on file    Inability: Not on file  . Transportation needs:    Medical: Not on file    Non-medical: Not on file  Tobacco Use  . Smoking status: Never Smoker  . Smokeless tobacco: Never Used  Substance and Sexual Activity  . Alcohol use: No  . Drug use: No  . Sexual activity: Not on file  Lifestyle  . Physical activity:    Days per week: Not on file    Minutes per session: Not on file  . Stress: Not on file  Relationships  . Social connections:    Talks on phone: Not on file    Gets together: Not on file    Attends religious service: Not on file    Active member of club or organization: Not on file    Attends meetings of clubs or organizations: Not on file    Relationship status: Not on file  . Intimate partner violence:    Fear of current or ex partner: Not on file    Emotionally abused: Not  on file    Physically abused: Not on file    Forced sexual activity: Not on file  Other Topics Concern  . Not on file  Social History Narrative  . Not on file    Family History  Problem Relation Age of Onset  . Cancer Sister     No current facility-administered medications for this encounter.    Current Outpatient Medications  Medication Sig Dispense Refill  . Cholecalciferol (VITAMIN D3) 5000 units CAPS Take 10,000 Units by mouth daily.    . Cyanocobalamin (B-12 PO) Take 1 tablet by mouth daily.    . cyclobenzaprine (FLEXERIL) 10 MG tablet Take 1 tablet (10 mg total) by mouth 2 (two) times daily as needed for muscle spasms. (Patient taking differently: Take 10 mg by mouth at bedtime. ) 20 tablet 0  . ibuprofen (ADVIL,MOTRIN) 200 MG tablet Take 400 mg by mouth daily as needed for headache or moderate pain.    . Melatonin 5 MG TABS Take 5 mg by mouth at bedtime as needed (sleep).    . clobetasol cream (TEMOVATE) 4.40 % Apply 1 application topically daily as needed for itching or dry skin.  3  . hydrochlorothiazide (MICROZIDE) 12.5 MG capsule Take 1 capsule (12.5 mg total) by mouth daily. (Patient not taking: Reported on 10/05/2018) 30 capsule 0  . minoxidil (LONITEN) 2.5 MG tablet Take 2.5 mg by mouth daily.  3  . naproxen (NAPROSYN) 500 MG tablet Take 1 tablet (500 mg total) by mouth 2 (two) times daily with a meal. (Patient not taking: Reported on 10/05/2018) 20 tablet 0  . ondansetron (ZOFRAN) 4 MG tablet Take 1 tablet (4 mg total) by mouth every 8 (eight) hours as needed for nausea. 5 tablet 10  . pantoprazole (PROTONIX) 40 MG tablet Take 1 tablet (40 mg total) by mouth daily. 20 tablet 1  . promethazine (PHENERGAN) 25  MG suppository Place 1 suppository (25 mg total) rectally every 6 (six) hours as needed for nausea. 3 suppository 10     Allergies  Allergen Reactions  . Sulfa Antibiotics Itching  . Tape Itching    Causes burn marks   . Clotrimazole     Made rash worse  . Eggs  Or Egg-Derived Products Hives    mouth and throat itching  . Hydrocodone Itching  . Shellfish Allergy Hives    Mouth and throat itching    Signed: Morton Peters, MD, FACS, MASCRS Gastrointestinal and Minimally Invasive Surgery    [address], [place], [place] [address] [phone] Main / Paging [phone] Fax   10/21/2018, 9:35 AM

## 2018-10-26 ENCOUNTER — Ambulatory Visit: Payer: Self-pay | Admitting: Surgery

## 2018-11-01 DIAGNOSIS — Z5189 Encounter for other specified aftercare: Secondary | ICD-10-CM

## 2018-11-01 HISTORY — DX: Encounter for other specified aftercare: Z51.89

## 2019-01-11 ENCOUNTER — Encounter: Payer: Self-pay | Admitting: Allergy and Immunology

## 2019-01-11 ENCOUNTER — Ambulatory Visit: Payer: BLUE CROSS/BLUE SHIELD | Admitting: Allergy and Immunology

## 2019-01-11 VITALS — BP 136/82 | HR 80 | Temp 98.4°F | Resp 16 | Ht 63.0 in | Wt 170.0 lb

## 2019-01-11 DIAGNOSIS — T7800XD Anaphylactic reaction due to unspecified food, subsequent encounter: Secondary | ICD-10-CM

## 2019-01-11 DIAGNOSIS — J3089 Other allergic rhinitis: Secondary | ICD-10-CM

## 2019-01-11 DIAGNOSIS — L500 Allergic urticaria: Secondary | ICD-10-CM | POA: Insufficient documentation

## 2019-01-11 DIAGNOSIS — H1013 Acute atopic conjunctivitis, bilateral: Secondary | ICD-10-CM

## 2019-01-11 DIAGNOSIS — H1010 Acute atopic conjunctivitis, unspecified eye: Secondary | ICD-10-CM | POA: Insufficient documentation

## 2019-01-11 DIAGNOSIS — J4520 Mild intermittent asthma, uncomplicated: Secondary | ICD-10-CM | POA: Diagnosis not present

## 2019-01-11 DIAGNOSIS — T7800XA Anaphylactic reaction due to unspecified food, initial encounter: Secondary | ICD-10-CM | POA: Insufficient documentation

## 2019-01-11 MED ORDER — OLOPATADINE HCL 0.7 % OP SOLN: 1.0000 [drp] | Freq: Every day | OPHTHALMIC | 5 refills | Status: DC | PRN

## 2019-01-11 MED ORDER — FLUTICASONE PROPIONATE 50 MCG/ACT NA SUSP: 1.0000 | Freq: Every day | NASAL | 2 refills | Status: DC

## 2019-01-11 MED ORDER — EPINEPHRINE 0.3 MG/0.3ML IJ SOAJ: 0.3000 mg | INTRAMUSCULAR | 2 refills | Status: DC | PRN

## 2019-01-11 MED ORDER — LEVOCETIRIZINE DIHYDROCHLORIDE 5 MG PO TABS: 5.0000 mg | ORAL_TABLET | Freq: Every evening | ORAL | 11 refills | Status: DC

## 2019-01-11 NOTE — Progress Notes (Signed)
New Patient Note  RE: Angela Mcconnell MRN: [id number] DOB: 1960-12-03 Date of Office Visit: 01/11/2019  Referring provider: Leighton Ruff, MD Primary care provider: Leighton Ruff, MD  Chief Complaint: Allergic Reaction and Urticaria   History of present illness: Angela Mcconnell is a 58 y.o. female seen today in consultation requested by Leighton Ruff, MD.  She reports that approximately 2 weeks ago she consumed trout for lunch and, towards the end of the meal, experienced the sensation of "burning and itching" of her lips and mouth.  She took diphenhydramine and the symptoms resolved without further intervention.  However, later that evening she consumed a solid and similar symptoms recurred.  She did not experience urticaria, angioedema, cardiopulmonary symptoms, or other GI symptoms.  2 days later, she consumed steak and broccoli and within 10 to 15 minutes of finishing the meal she developed hives on her arms as well as pruritus of the mouth and upper torso.  Today she ate a ham and cheese sandwich and experienced pruritus of the mouth and lips.  She did not experience concomitant urticaria, angioedema, cardiopulmonary symptoms, or other GI symptoms. [name] was diagnosed with asthma approximately 10 years ago.  Her asthma symptoms are typically only triggered with upper respiratory tract infections and pollen exposure.  She has albuterol rescue on hand for the rare occasions that she requires it.  She does not experience nocturnal awakenings due to lower respiratory symptoms or limitations in normal daily activities. Tonya experiences nasal congestion, rhinorrhea, sneezing, postnasal drainage, nasal pruritus, and ocular pruritus.  These symptoms occur year-round but are more frequent and severe with pollen exposure.  She attempts to control the symptoms with cetirizine or loratadine.  Assessment and plan: Allergic urticaria Skin tests to select food allergens were  positive only to shrimp, crab, and lobster.  There may have been cross-contamination of shellfish protein on the initial episode along with a biphasic reaction. NSAIDs and emotional stress commonly exacerbate urticaria but are not the underlying etiology in this case. Physical urticarias are negative by history (i.e. pressure-induced, temperature, vibration, solar, etc.). We will rule out other potential etiologies with labs. For symptom relief, patient is to take oral antihistamines as directed.  The following labs have been ordered: FCeRI antibody, anti-thyroglobulin antibody, thyroid peroxidase antibody, tryptase, urea breath test, CBC, CMP, ESR, ANA, and serum specific IgE against fish panel, and alpha gal panel.  The patient will be called with further recommendations after lab results have returned.  Instructions have been discussed and provided for H1/H2 receptor blockade with titration to find lowest effective dose.  Should there be a significant increase or change in symptoms, a journal is to be kept recording any foods eaten, beverages consumed, medications taken within a 6 hour period prior to the onset of symptoms, as well as record activities being performed, and environmental conditions. For any symptoms concerning for anaphylaxis, epinephrine is to be administered and 911 is to be called immediately.  A prescription has been provided for epinephrine 0.3 mg autoinjector (Auvi-Q) 2 pack along with instructions for its proper administration.  Food allergy The patient's history suggests food allergy and positive skin test results today confirm this diagnosis.  Meticulous avoidance of shellfish as discussed.  A prescription has been provided for epinephrine auto-injector (Auvi-Q) 2 pack along with instructions for proper administration.  A food allergy action plan has been provided and discussed.  Medic Alert identification is recommended.  Seasonal allergic rhinitis  Aeroallergen  avoidance measures have been  discussed and provided in written form.  A prescription has been provided for levocetirizine, 5 mg daily as needed.  A prescription has been provided for fluticasone nasal spray, one spray per nostril 1-2 times daily as needed. Proper nasal spray technique has been discussed and demonstrated.  Nasal saline spray (i.e., Simply Saline) or nasal saline lavage (i.e., NeilMed) is recommended as needed and prior to medicated nasal sprays.  Allergic conjunctivitis  Treatment plan as outlined above for allergic rhinitis.  A prescription has been provided for Pazeo, one drop per eye daily as needed.  I have also recommended eye lubricant drops (i.e., Natural Tears) as needed.  Mild intermittent asthma  Continue albuterol HFA, 1 to 2 inhalations every 4-6 hours if needed.  Subjective and objective measures of pulmonary function will be followed and the treatment plan will be adjusted accordingly.   Meds ordered this encounter  Medications  . DISCONTD: EPINEPHrine (AUVI-Q) 0.3 mg/0.3 mL IJ SOAJ injection    Sig: Inject 0.3 mLs (0.3 mg total) into the muscle as needed for anaphylaxis.    Dispense:  2 Device    Refill:  2  . EPINEPHrine (AUVI-Q) 0.3 mg/0.3 mL IJ SOAJ injection    Sig: Inject 0.3 mLs (0.3 mg total) into the muscle as needed for anaphylaxis.    Dispense:  2 Device    Refill:  2  . levocetirizine (XYZAL) 5 MG tablet    Sig: Take 1 tablet (5 mg total) by mouth every evening.    Dispense:  30 tablet    Refill:  11  . fluticasone (FLONASE) 50 MCG/ACT nasal spray    Sig: Place 1 spray into both nostrils daily.    Dispense:  16 g    Refill:  2  . Olopatadine HCl (PAZEO) 0.7 % SOLN    Sig: Apply 1 drop to eye daily as needed.    Dispense:  1 Bottle    Refill:  5    Diagnostics: Spirometry: Normal with an FEV1 of 120% predicted.  Please see scanned spirometry results for details. Environmental skin testing: Positive to grass pollen and tree  pollen. Food allergen skin testing: Positive to shrimp and crab.    Physical examination: Blood pressure 136/82, pulse 80, temperature 98.4 F (36.9 C), temperature source Oral, resp. rate 16, height 5' 3" (1.6 m), weight 170 lb (77.1 kg), SpO2 98 %.  General: Alert, interactive, in no acute distress. HEENT: TMs pearly gray, turbinates moderately edematous with clear discharge, post-pharynx moderately erythematous. Neck: Supple without lymphadenopathy. Lungs: Clear to auscultation without wheezing, rhonchi or rales. CV: Normal S1, S2 without murmurs. Abdomen: Nondistended, nontender. Skin: Warm and dry, without lesions or rashes. Extremities:  No clubbing, cyanosis or edema. Neuro:   Grossly intact.  Review of systems:  Review of systems negative except as noted in HPI / PMHx or noted below: Review of Systems  Constitutional: Negative.   HENT: Negative.   Eyes: Negative.   Respiratory: Negative.   Cardiovascular: Negative.   Gastrointestinal: Negative.   Genitourinary: Negative.   Musculoskeletal: Negative.   Skin: Negative.   Neurological: Negative.   Endo/Heme/Allergies: Negative.   Psychiatric/Behavioral: Negative.     Past medical history:  Past Medical History:  Diagnosis Date  . Arthritis   . Asthma   . Palpitations   . Shortness of breath     Past surgical history:  Past Surgical History:  Procedure Laterality Date  . ABDOMINAL HYSTERECTOMY    . CORONARY ANGIOGRAM  Nov 2010  Normal coronary arteries  . ESOPHAGEAL MANOMETRY N/A 08/12/2018   Procedure: ESOPHAGEAL MANOMETRY (EM);  Surgeon: Clarene Essex, MD;  Location: [facility];  Service: Endoscopy;  Laterality: N/A;  . INSERTION OF MESH N/A 10/14/2018   Procedure: INSERTION OF MESH;  Surgeon: Michael Boston, MD;  Location: [facility];  Service: General;  Laterality: N/A;  . KNEE SURGERY      Family history: Family History  Problem Relation Age of Onset  . Cancer Sister     Social history: Social  History   Socioeconomic History  . Marital status: Significant Other    Spouse name: Not on file  . Number of children: Not on file  . Years of education: Not on file  . Highest education level: Not on file  Occupational History  . Not on file  Social Needs  . Financial resource strain: Not on file  . Food insecurity:    Worry: Not on file    Inability: Not on file  . Transportation needs:    Medical: Not on file    Non-medical: Not on file  Tobacco Use  . Smoking status: Never Smoker  . Smokeless tobacco: Never Used  Substance and Sexual Activity  . Alcohol use: No  . Drug use: No  . Sexual activity: Not on file  Lifestyle  . Physical activity:    Days per week: Not on file    Minutes per session: Not on file  . Stress: Not on file  Relationships  . Social connections:    Talks on phone: Not on file    Gets together: Not on file    Attends religious service: Not on file    Active member of club or organization: Not on file    Attends meetings of clubs or organizations: Not on file    Relationship status: Not on file  . Intimate partner violence:    Fear of current or ex partner: Not on file    Emotionally abused: Not on file    Physically abused: Not on file    Forced sexual activity: Not on file  Other Topics Concern  . Not on file  Social History Narrative  . Not on file   Environmental History: The patient lives in a 39-year-old house with carpeting throughout and central air/heat.  There are 2 dogs in the home which have access to her bedroom.  There is no known mold/water damage in the home.  She is a non-smoker.  Allergies as of 01/11/2019      Reactions   Sulfa Antibiotics Itching   Tape Itching   Causes burn marks    Clotrimazole    Made rash worse   Eggs Or Egg-derived Products Hives   mouth and throat itching   Hydrocodone Itching   Shellfish Allergy Hives   Mouth and throat itching      Medication List       Accurate as of January 11, 2019   5:29 PM. Always use your most recent med list.        albuterol 108 (90 Base) MCG/ACT inhaler Commonly known as:  PROVENTIL HFA;VENTOLIN HFA Inhale into the lungs.   B-12 PO Take 1 tablet by mouth daily.   Cyanocobalamin Powd Take by mouth.   EPINEPHrine 0.3 mg/0.3 mL Soaj injection Commonly known as:  AUVI-Q Inject 0.3 mLs (0.3 mg total) into the muscle as needed for anaphylaxis.   fluticasone 50 MCG/ACT nasal spray Commonly known as:  FLONASE Place 1 spray into  both nostrils daily.   levocetirizine 5 MG tablet Commonly known as:  XYZAL Take 1 tablet (5 mg total) by mouth every evening.   Melatonin 5 MG Tabs Take 5 mg by mouth at bedtime as needed (sleep).   naproxen 500 MG tablet Commonly known as:  NAPROSYN Take 1 tablet (500 mg total) by mouth 2 (two) times daily with a meal.   Olopatadine HCl 0.7 % Soln Commonly known as:  PAZEO Apply 1 drop to eye daily as needed.   ondansetron 4 MG tablet Commonly known as:  ZOFRAN Take 1 tablet (4 mg total) by mouth every 8 (eight) hours as needed for nausea.   pantoprazole 40 MG tablet Commonly known as:  PROTONIX Take 1 tablet (40 mg total) by mouth daily.   promethazine 25 MG suppository Commonly known as:  PHENERGAN Place 1 suppository (25 mg total) rectally every 6 (six) hours as needed for nausea.   Vitamin D3 125 MCG (5000 UT) Caps Take 10,000 Units by mouth daily.       Known medication allergies: Allergies  Allergen Reactions  . Sulfa Antibiotics Itching  . Tape Itching    Causes burn marks   . Clotrimazole     Made rash worse  . Eggs Or Egg-Derived Products Hives    mouth and throat itching  . Hydrocodone Itching  . Shellfish Allergy Hives    Mouth and throat itching    I appreciate the opportunity to take part in Tonya's care. Please do not hesitate to contact me with questions.  Sincerely,   R. Edgar Frisk, MD

## 2019-01-11 NOTE — Assessment & Plan Note (Signed)
   Continue albuterol HFA, 1 to 2 inhalations every 4-6 hours if needed.  Subjective and objective measures of pulmonary function will be followed and the treatment plan will be adjusted accordingly. 

## 2019-01-11 NOTE — Patient Instructions (Addendum)
Allergic urticaria Skin tests to select food allergens were positive only to shrimp, crab, and lobster.  There may have been cross-contamination of shellfish protein on the initial episode along with a biphasic reaction. NSAIDs and emotional stress commonly exacerbate urticaria but are not the underlying etiology in this case. Physical urticarias are negative by history (i.e. pressure-induced, temperature, vibration, solar, etc.). We will rule out other potential etiologies with labs. For symptom relief, patient is to take oral antihistamines as directed.  The following labs have been ordered: FCeRI antibody, anti-thyroglobulin antibody, thyroid peroxidase antibody, tryptase, urea breath test, CBC, CMP, ESR, ANA, and serum specific IgE against fish panel, and alpha gal panel.  The patient will be called with further recommendations after lab results have returned.  Instructions have been discussed and provided for H1/H2 receptor blockade with titration to find lowest effective dose.  Should there be a significant increase or change in symptoms, a journal is to be kept recording any foods eaten, beverages consumed, medications taken within a 6 hour period prior to the onset of symptoms, as well as record activities being performed, and environmental conditions. For any symptoms concerning for anaphylaxis, epinephrine is to be administered and 911 is to be called immediately.  A prescription has been provided for epinephrine 0.3 mg autoinjector (Auvi-Q) 2 pack along with instructions for its proper administration.  Food allergy The patient's history suggests food allergy and positive skin test results today confirm this diagnosis.  Meticulous avoidance of shellfish as discussed.  A prescription has been provided for epinephrine auto-injector (Auvi-Q) 2 pack along with instructions for proper administration.  A food allergy action plan has been provided and discussed.  Medic Alert identification  is recommended.  Seasonal allergic rhinitis  Aeroallergen avoidance measures have been discussed and provided in written form.  A prescription has been provided for levocetirizine, 5 mg daily as needed.  A prescription has been provided for fluticasone nasal spray, one spray per nostril 1-2 times daily as needed. Proper nasal spray technique has been discussed and demonstrated.  Nasal saline spray (i.e., Simply Saline) or nasal saline lavage (i.e., NeilMed) is recommended as needed and prior to medicated nasal sprays.  Allergic conjunctivitis  Treatment plan as outlined above for allergic rhinitis.  A prescription has been provided for Pazeo, one drop per eye daily as needed.  I have also recommended eye lubricant drops (i.e., Natural Tears) as needed.  Mild intermittent asthma  Continue albuterol HFA, 1 to 2 inhalations every 4-6 hours if needed.  Subjective and objective measures of pulmonary function will be followed and the treatment plan will be adjusted accordingly.   When lab results have returned the patient will be called with further recommendations and follow up instructions.  Urticaria (Hives)  . Levocetirizine (Xyzal) 5 mg twice a day and famotidine (Pepcid) 20 mg twice a day. If no symptoms for 7-14 days then decrease to. . Levocetirizine (Xyzal) 5 mg twice a day and famotidine (Pepcid) 20 mg once a day.  If no symptoms for 7-14 days then decrease to. . Levocetirizine (Xyzal) 5 mg twice a day.  If no symptoms for 7-14 days then decrease to. . Levocetirizine (Xyzal) 5 mg once a day.  May use Benadryl (diphenhydramine) as needed for breakthrough symptoms       If symptoms return, then step up dosage   Reducing Pollen Exposure  The American Academy of Allergy, Asthma and Immunology suggests the following steps to reduce your exposure to pollen during allergy seasons.  1. Do not hang sheets or clothing out to dry; pollen may collect on these items. 2. Do not  mow lawns or spend time around freshly cut grass; mowing stirs up pollen. 3. Keep windows closed at night.  Keep car windows closed while driving. 4. Minimize morning activities outdoors, a time when pollen counts are usually at their highest. 5. Stay indoors as much as possible when pollen counts or humidity is high and on windy days when pollen tends to remain in the air longer. 6. Use air conditioning when possible.  Many air conditioners have filters that trap the pollen spores. 7. Use a HEPA room air filter to remove pollen form the indoor air you breathe.

## 2019-01-11 NOTE — Assessment & Plan Note (Addendum)
Skin tests to select food allergens were positive only to shrimp, crab, and lobster.  There may have been cross-contamination of shellfish protein on the initial episode along with a biphasic reaction. NSAIDs and emotional stress commonly exacerbate urticaria but are not the underlying etiology in this case. Physical urticarias are negative by history (i.e. pressure-induced, temperature, vibration, solar, etc.). We will rule out other potential etiologies with labs. For symptom relief, patient is to take oral antihistamines as directed.  The following labs have been ordered: FCeRI antibody, anti-thyroglobulin antibody, thyroid peroxidase antibody, tryptase, urea breath test, CBC, CMP, ESR, ANA, and serum specific IgE against fish panel, and alpha gal panel.  The patient will be called with further recommendations after lab results have returned.  Instructions have been discussed and provided for H1/H2 receptor blockade with titration to find lowest effective dose.  Should there be a significant increase or change in symptoms, a journal is to be kept recording any foods eaten, beverages consumed, medications taken within a 6 hour period prior to the onset of symptoms, as well as record activities being performed, and environmental conditions. For any symptoms concerning for anaphylaxis, epinephrine is to be administered and 911 is to be called immediately.  A prescription has been provided for epinephrine 0.3 mg autoinjector (Auvi-Q) 2 pack along with instructions for its proper administration.

## 2019-01-11 NOTE — Assessment & Plan Note (Signed)
   Aeroallergen avoidance measures have been discussed and provided in written form.  A prescription has been provided for levocetirizine, 5 mg daily as needed.  A prescription has been provided for fluticasone nasal spray, one spray per nostril 1-2 times daily as needed. Proper nasal spray technique has been discussed and demonstrated.  Nasal saline spray (i.e., Simply Saline) or nasal saline lavage (i.e., NeilMed) is recommended as needed and prior to medicated nasal sprays.

## 2019-01-11 NOTE — Assessment & Plan Note (Signed)
The patient's history suggests food allergy and positive skin test results today confirm this diagnosis.  Meticulous avoidance of shellfish as discussed.  A prescription has been provided for epinephrine auto-injector (Auvi-Q) 2 pack along with instructions for proper administration.  A food allergy action plan has been provided and discussed.  Medic Alert identification is recommended.

## 2019-01-11 NOTE — Assessment & Plan Note (Signed)
   Treatment plan as outlined above for allergic rhinitis.  A prescription has been provided for Pazeo, one drop per eye daily as needed.  I have also recommended eye lubricant drops (i.e., Natural Tears) as needed. 

## 2019-02-17 ENCOUNTER — Other Ambulatory Visit: Payer: Self-pay | Admitting: Family Medicine

## 2019-02-17 DIAGNOSIS — Z1231 Encounter for screening mammogram for malignant neoplasm of breast: Secondary | ICD-10-CM

## 2019-03-04 ENCOUNTER — Telehealth: Payer: Self-pay | Admitting: Allergy and Immunology

## 2019-03-04 MED ORDER — ALBUTEROL SULFATE HFA 108 (90 BASE) MCG/ACT IN AERS: 2.0000 | INHALATION_SPRAY | RESPIRATORY_TRACT | 1 refills | Status: DC | PRN

## 2019-03-04 MED ORDER — LEVOCETIRIZINE DIHYDROCHLORIDE 5 MG PO TABS: 5.0000 mg | ORAL_TABLET | Freq: Every evening | ORAL | 11 refills | Status: DC

## 2019-03-04 NOTE — Telephone Encounter (Signed)
Patient said her insurance will only cover her medications if they are sent to a [facility]. She needs refills on her inhaler and levocetirizine. [facility] [place] on [address].

## 2019-03-04 NOTE — Telephone Encounter (Signed)
Medication sent to pharmacy  

## 2019-03-30 ENCOUNTER — Ambulatory Visit: Payer: BLUE CROSS/BLUE SHIELD

## 2019-05-06 ENCOUNTER — Other Ambulatory Visit: Payer: Self-pay

## 2019-05-06 ENCOUNTER — Ambulatory Visit
Admission: RE | Admit: 2019-05-06 | Discharge: 2019-05-06 | Disposition: A | Discharge status: Home / Self Care | Payer: BLUE CROSS/BLUE SHIELD | Source: Ambulatory Visit | Attending: Family Medicine | Admitting: Family Medicine

## 2019-05-06 ENCOUNTER — Ambulatory Visit: Payer: BLUE CROSS/BLUE SHIELD

## 2019-05-06 DIAGNOSIS — Z1231 Encounter for screening mammogram for malignant neoplasm of breast: Secondary | ICD-10-CM

## 2019-07-01 IMAGING — RF DG ESOPHAGUS
7 series · 10 of 10 positions shown · non-contrast
Comparison: None.

CLINICAL DATA: Postop Nissen fundoplication. Difficulty swallowing
with mid chest discomfort.

EXAM:
ESOPHOGRAM/BARIUM SWALLOW
TECHNIQUE: Single contrast examination was performed using  water-soluble.
FLUOROSCOPY TIME:  Fluoroscopy Time:  0 minutes 54 seconds
Radiation Exposure Index (if provided by the fluoroscopic device):
18 mGy
Number of Acquired Spot Images: 2

[Series 1: cp_standard · 0.36mm/px · 4 of 64 frames shown (1 of 5)]
[frame 10/64]
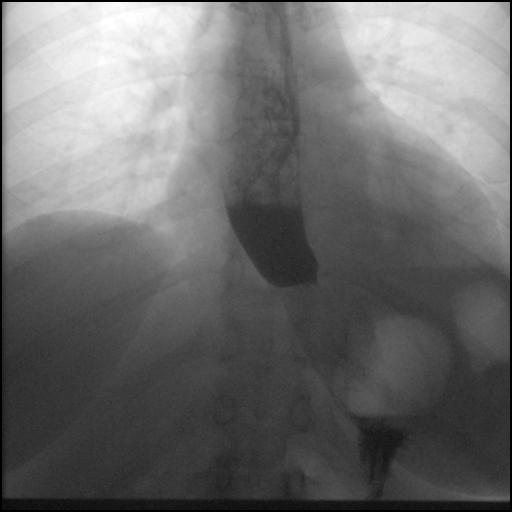
[frame 32/64]
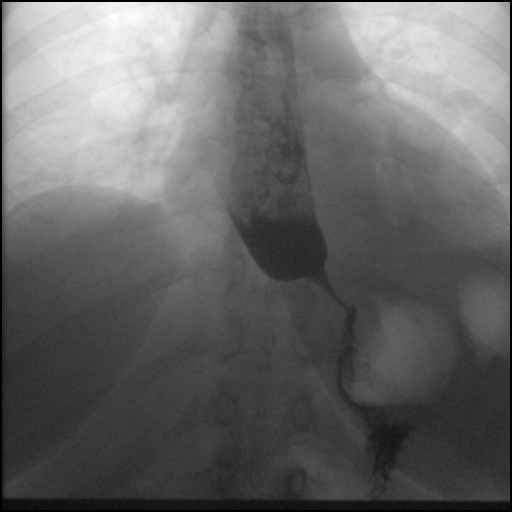
[frame 33/64]
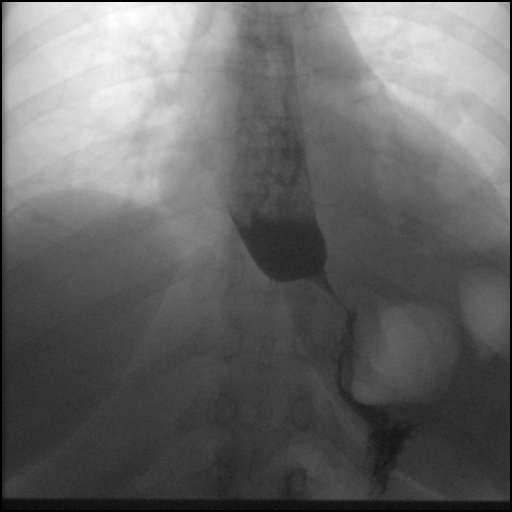
[frame 55/64]
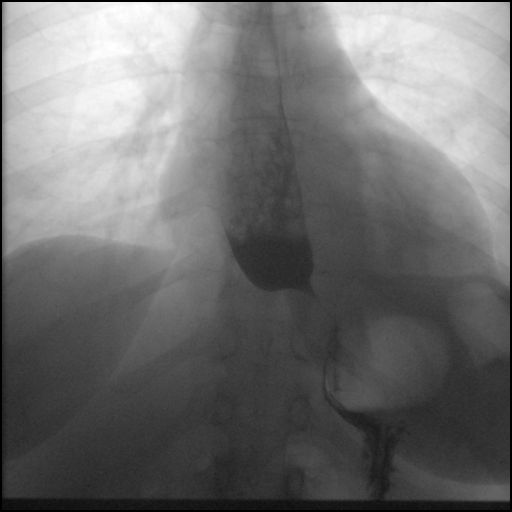

[Series 2: fluoro_barium 2fps_bw · 0.18mm/px · 1 of 1 slices shown (1 of 2)]
[im 1/1]
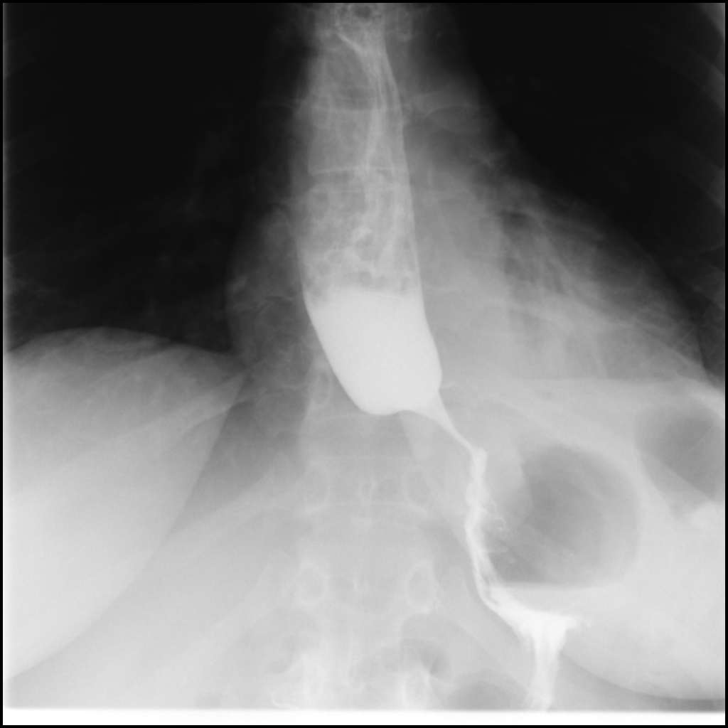

[Series 3: cp_standard · 0.18mm/px · 1 of 1 slices shown (2 of 5)]
[im 1/1]
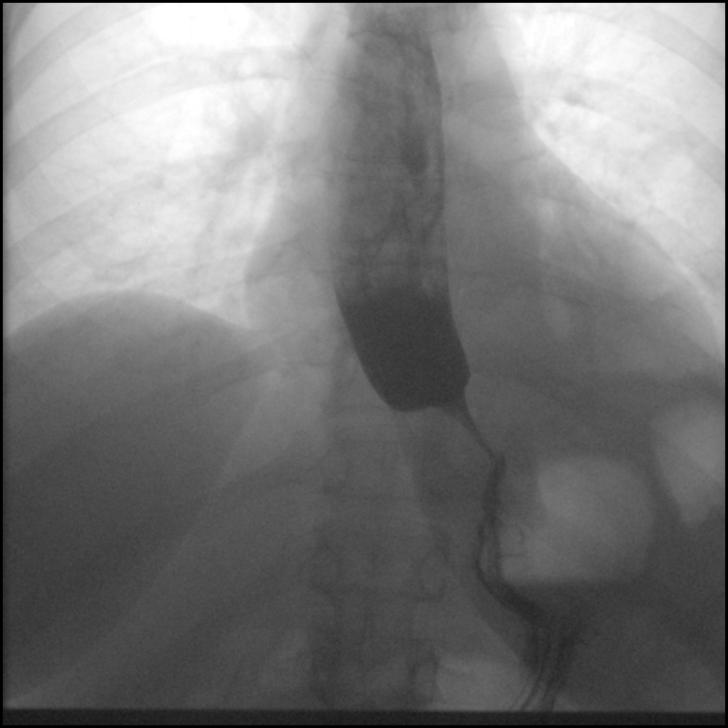

[Series 4: cp_standard · 0.18mm/px · 1 of 1 slices shown (3 of 5)]
[im 1/1]
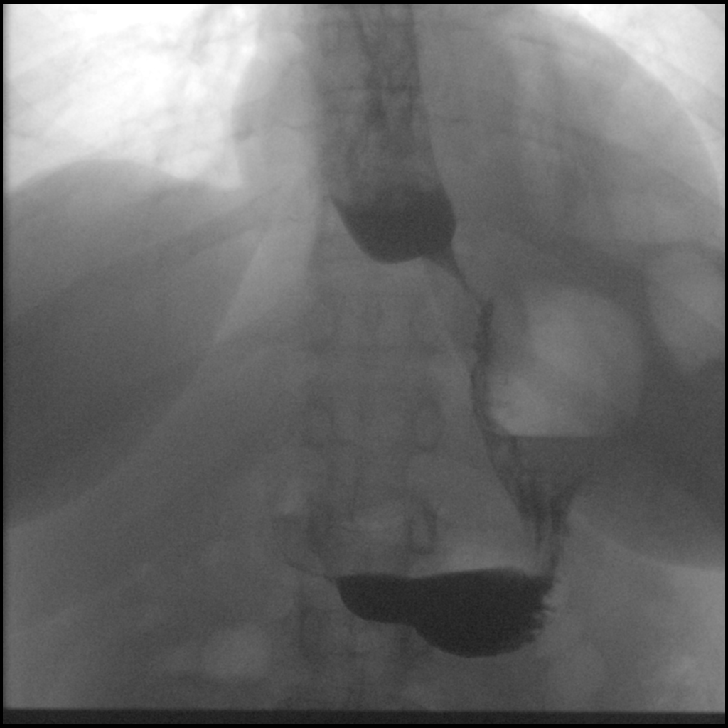

[Series 5: fluoro_barium 2fps_bw · 0.18mm/px · 1 of 1 slices shown (2 of 2)]
[im 1/1]
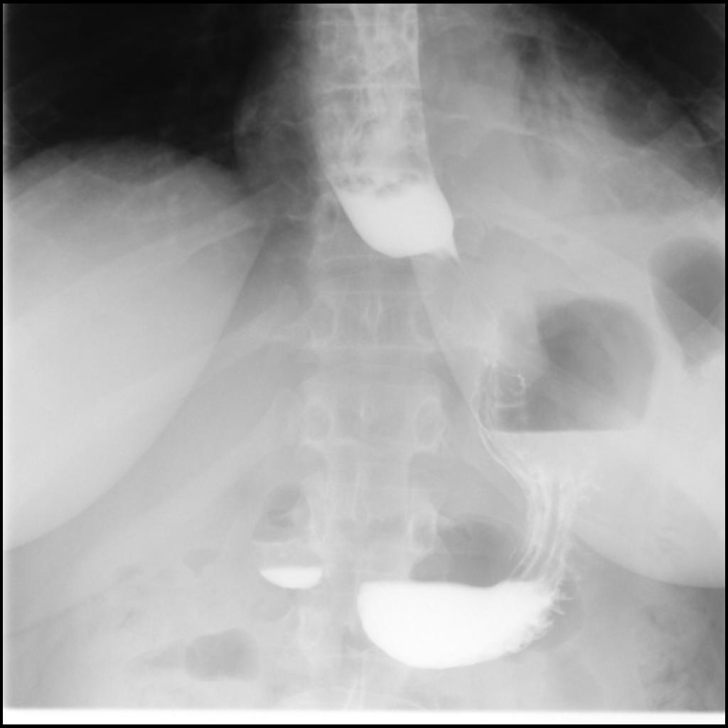

[Series 6: cp_standard · 0.18mm/px · 1 of 1 slices shown (4 of 5)]
[im 1/1]
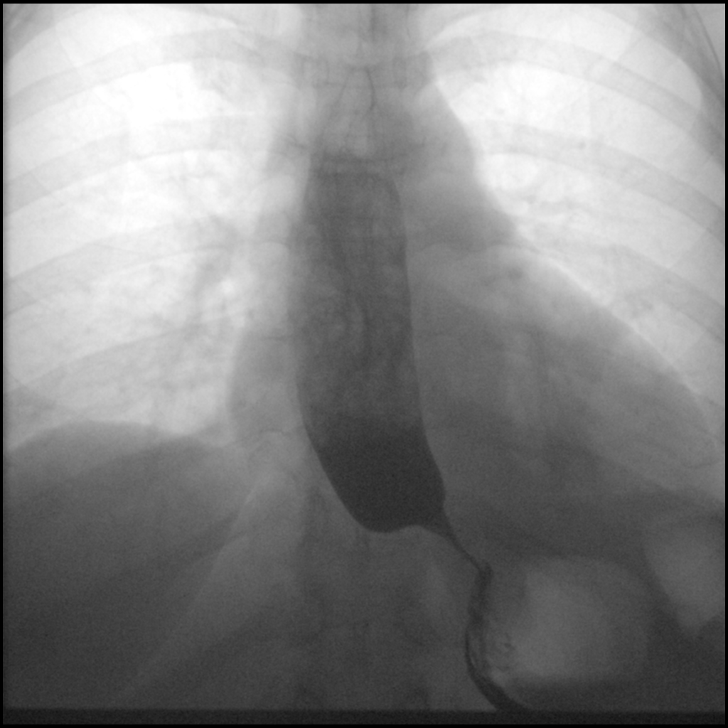

[Series 7: cp_standard · 0.18mm/px · 1 of 1 slices shown (5 of 5)]
[im 1/1]
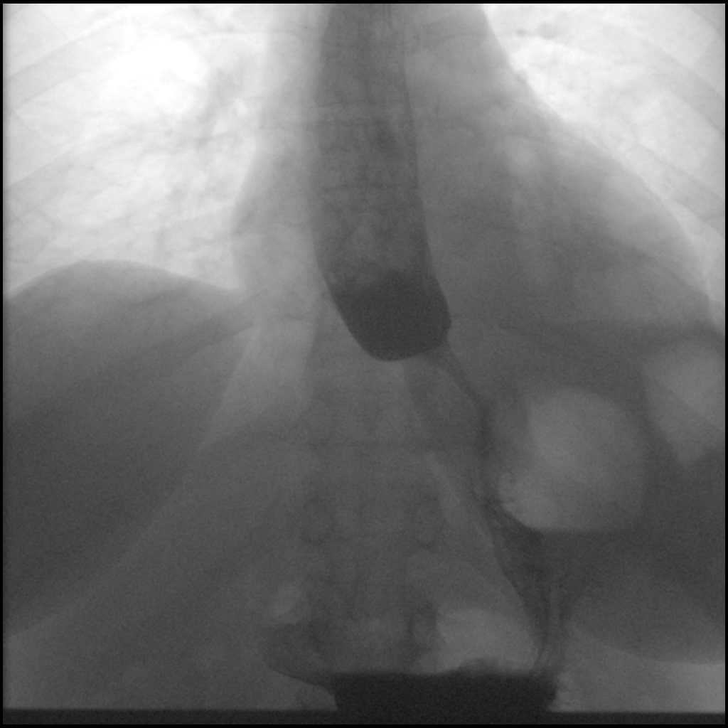

[10 of 10 positions shown; findings below may reference images not displayed]

FINDINGS: Esophagus is dilated and may contain mild food debris. Nissen
fundoplication with a narrow channel of transit between the distal
esophagus and proximal stomach. Patient experienced discomfort with
swallowing. No leak.
IMPRESSION: Nissen fundoplication with a narrow channel transit between the
distal esophagus and proximal stomach. Associated esophageal
dilatation. Findings may be due to postoperative edema. Please
correlate clinically. No leak.

## 2020-01-10 ENCOUNTER — Other Ambulatory Visit: Payer: Self-pay

## 2020-01-10 ENCOUNTER — Ambulatory Visit: Payer: 59 | Attending: Family Medicine | Admitting: Family Medicine

## 2020-01-10 DIAGNOSIS — G4709 Other insomnia: Secondary | ICD-10-CM

## 2020-01-10 DIAGNOSIS — R002 Palpitations: Secondary | ICD-10-CM | POA: Diagnosis not present

## 2020-01-10 DIAGNOSIS — F419 Anxiety disorder, unspecified: Secondary | ICD-10-CM | POA: Diagnosis not present

## 2020-01-10 NOTE — Progress Notes (Signed)
Heart racing at times

## 2020-01-10 NOTE — Progress Notes (Signed)
Virtual Visit via Telephone Note  I connected with [employer], on 01/10/2020 at 3:13 PM by telephone due to the COVID-19 pandemic and verified that I am speaking with the correct person using two identifiers.   Consent: I discussed the limitations, risks, security and privacy concerns of performing an evaluation and management service by telephone and the availability of in person appointments. I also discussed with the patient that there may be a patient responsible charge related to this service. The patient expressed understanding and agreed to proceed.   Location of Patient: Home  Location of Provider: Work   Persons participating in Telemedicine visit: Jazmane Lorette Basaldua Alicia Farrington-CMA Dr. Margarita Rana     History of Present Illness: Angela Mcconnell is a 59 year old woman here to establish care  Previously followed by Dr Drema Dallas with her last visits 3 years.  She complains of "heart racing" which occurs even at rest and feels her heart pounding. This has been present for over one month. Her chart reveals a diagnosis of palpitations and last EKG from 10/2018 revealed RBBB. She has been home since the Pandemic hit 1 year ago as she works remotely. Started getting on the Treadmill a month ago to loose the 20 lbs which she has gained. Sometimes feels like the room moving but denies dizziness. Since she has been home she has had Anxiety as she is home with her dogs. Prescribed Trazodone for insomnia at an UC which she did not take due to side effects. She uses Melatonin, Tylenol pm and some herbs to help with Tea. She would like to  Past Medical History:  Diagnosis Date  . Arthritis   . Asthma   . Palpitations   . Shortness of breath    Allergies  Allergen Reactions  . Sulfa Antibiotics Itching  . Tape Itching    Causes burn marks   . Clotrimazole     Made rash worse  . Eggs Or Egg-Derived Products Hives    mouth and throat itching  . Hydrocodone Itching   . Shellfish Allergy Hives    Mouth and throat itching    Current Outpatient Medications on File Prior to Visit  Medication Sig Dispense Refill  . albuterol (PROVENTIL HFA;VENTOLIN HFA) 108 (90 Base) MCG/ACT inhaler Inhale 2 puffs into the lungs every 4 (four) hours as needed for wheezing or shortness of breath. 1 Inhaler 1  . Cholecalciferol (VITAMIN D3) 5000 units CAPS Take 10,000 Units by mouth daily.    . Cyanocobalamin (B-12 PO) Take 1 tablet by mouth daily.    Marland Kitchen EPINEPHrine (AUVI-Q) 0.3 mg/0.3 mL IJ SOAJ injection Inject 0.3 mLs (0.3 mg total) into the muscle as needed for anaphylaxis. 2 Device 2  . fluticasone (FLONASE) 50 MCG/ACT nasal spray Place 1 spray into both nostrils daily. 16 g 2  . levocetirizine (XYZAL) 5 MG tablet Take 1 tablet (5 mg total) by mouth every evening. 30 tablet 11  . Olopatadine HCl (PAZEO) 0.7 % SOLN Apply 1 drop to eye daily as needed. 1 Bottle 5  . pantoprazole (PROTONIX) 40 MG tablet Take 1 tablet (40 mg total) by mouth daily. 20 tablet 1  . Cyanocobalamin POWD Take by mouth.    . Melatonin 5 MG TABS Take 5 mg by mouth at bedtime as needed (sleep).    . naproxen (NAPROSYN) 500 MG tablet Take 1 tablet (500 mg total) by mouth 2 (two) times daily with a meal. (Patient not taking: Reported on 01/10/2020) 20 tablet 0  .  ondansetron (ZOFRAN) 4 MG tablet Take 1 tablet (4 mg total) by mouth every 8 (eight) hours as needed for nausea. (Patient not taking: Reported on 01/11/2019) 5 tablet 10  . promethazine (PHENERGAN) 25 MG suppository Place 1 suppository (25 mg total) rectally every 6 (six) hours as needed for nausea. (Patient not taking: Reported on 01/11/2019) 3 suppository 10   No current facility-administered medications on file prior to visit.    Observations/Objective: Alert, awake, oriented x3 Not in acute distress  Assessment and Plan: 1. Palpitations Could be related to anxiety EKG from 10/2018 revealed RBBB She will need an in person visit for thyroid  labs, EKG  2. Other insomnia Uncontrolled Worsened by anxiety Currently on OTC regimen but would like to resume trazodone We have discussed the role of hydroxyzine in controlling both anxiety and insomnia but she would like to hold off at this time  3. Anxiety Anxiety coupled with ongoing pandemic Declines initiation of hydroxyzine at this time   Follow Up Instructions: Return in about 2 weeks (around 01/24/2020) for Palpitations-in person.    I discussed the assessment and treatment plan with the patient. The patient was provided an opportunity to ask questions and all were answered. The patient agreed with the plan and demonstrated an understanding of the instructions.   The patient was advised to call back or seek an in-person evaluation if the symptoms worsen or if the condition fails to improve as anticipated.     I provided 18 minutes total of non-face-to-face time during this encounter including median intraservice time, reviewing previous notes, investigations, ordering medications, medical decision making, coordinating care and patient verbalized understanding at the end of the visit.     Charlott Rakes, MD, FAAFP. [facility] and [place], [place]   01/10/2020, 3:13 PM

## 2020-01-11 ENCOUNTER — Encounter: Payer: Self-pay | Admitting: Family Medicine

## 2020-01-31 ENCOUNTER — Ambulatory Visit: Payer: 59 | Admitting: Family Medicine

## 2020-02-07 ENCOUNTER — Encounter: Payer: Self-pay | Admitting: Family Medicine

## 2020-02-07 ENCOUNTER — Other Ambulatory Visit: Payer: Self-pay

## 2020-02-07 ENCOUNTER — Ambulatory Visit: Payer: 59 | Attending: Family Medicine | Admitting: Family Medicine

## 2020-02-07 VITALS — BP 125/78 | HR 73 | Ht 63.0 in | Wt 190.0 lb

## 2020-02-07 DIAGNOSIS — F419 Anxiety disorder, unspecified: Secondary | ICD-10-CM | POA: Diagnosis not present

## 2020-02-07 DIAGNOSIS — Z6833 Body mass index (BMI) 33.0-33.9, adult: Secondary | ICD-10-CM | POA: Insufficient documentation

## 2020-02-07 DIAGNOSIS — R002 Palpitations: Secondary | ICD-10-CM

## 2020-02-07 DIAGNOSIS — Z883 Allergy status to other anti-infective agents status: Secondary | ICD-10-CM | POA: Insufficient documentation

## 2020-02-07 DIAGNOSIS — I4510 Unspecified right bundle-branch block: Secondary | ICD-10-CM | POA: Diagnosis not present

## 2020-02-07 DIAGNOSIS — G4709 Other insomnia: Secondary | ICD-10-CM

## 2020-02-07 DIAGNOSIS — M1990 Unspecified osteoarthritis, unspecified site: Secondary | ICD-10-CM | POA: Insufficient documentation

## 2020-02-07 DIAGNOSIS — Z91013 Allergy to seafood: Secondary | ICD-10-CM | POA: Diagnosis not present

## 2020-02-07 DIAGNOSIS — Z79899 Other long term (current) drug therapy: Secondary | ICD-10-CM | POA: Diagnosis not present

## 2020-02-07 DIAGNOSIS — J3489 Other specified disorders of nose and nasal sinuses: Secondary | ICD-10-CM

## 2020-02-07 DIAGNOSIS — Z90710 Acquired absence of both cervix and uterus: Secondary | ICD-10-CM | POA: Insufficient documentation

## 2020-02-07 DIAGNOSIS — E66811 Obesity, class 1: Secondary | ICD-10-CM

## 2020-02-07 DIAGNOSIS — Z885 Allergy status to narcotic agent status: Secondary | ICD-10-CM | POA: Insufficient documentation

## 2020-02-07 DIAGNOSIS — E669 Obesity, unspecified: Secondary | ICD-10-CM | POA: Insufficient documentation

## 2020-02-07 DIAGNOSIS — J349 Unspecified disorder of nose and nasal sinuses: Secondary | ICD-10-CM | POA: Diagnosis not present

## 2020-02-07 DIAGNOSIS — Z882 Allergy status to sulfonamides status: Secondary | ICD-10-CM | POA: Diagnosis not present

## 2020-02-07 DIAGNOSIS — Z1159 Encounter for screening for other viral diseases: Secondary | ICD-10-CM

## 2020-02-07 DIAGNOSIS — J45909 Unspecified asthma, uncomplicated: Secondary | ICD-10-CM | POA: Diagnosis not present

## 2020-02-07 MED ORDER — HYDROXYZINE HCL 25 MG PO TABS: 25.0000 mg | ORAL_TABLET | Freq: Every evening | ORAL | 2 refills | Status: DC | PRN

## 2020-02-07 NOTE — Progress Notes (Signed)
Subjective:  Patient ID: Angela Mcconnell, female    DOB: 12-10-1960  Age: 59 y.o. MRN: [id number]  CC:  Chief Complaint  Patient presents with  . Palpitations     HPI [name] presents for a follow-up visit.  At her last office visit she had complained of palpitations, anxiety, insomnia and had been commenced on trazodone for insomnia but had declined initiation of medication for anxiety. Visit was scheduled for additional work-up of palpitations including EKG and thyroid panel.  She discontinued Trazodone due to the fact that it made her stopped up Insomnia is now uncontrolled and she uses Tylenol pm She was taking a natural supplement to help loose weight and is unsure if that caused palpitation; palpitations have improved at this time. Anxiety still persists and she finds her mind going on and on just before bedtime Ears feel clogged up and she endorses postnasal drainage but no sinus pressure or headache. Has tried to lose weight by exercising and trying to eat right but she is really sedentary as she now works virtually from home.  Past Medical History:  Diagnosis Date  . Arthritis   . Asthma   . Palpitations   . Shortness of breath     Past Surgical History:  Procedure Laterality Date  . ABDOMINAL HYSTERECTOMY    . CORONARY ANGIOGRAM  Nov 2010   Normal coronary arteries  . ESOPHAGEAL MANOMETRY N/A 08/12/2018   Procedure: ESOPHAGEAL MANOMETRY (EM);  Surgeon: Clarene Essex, MD;  Location: [facility];  Service: Endoscopy;  Laterality: N/A;  . INSERTION OF MESH N/A 10/14/2018   Procedure: INSERTION OF MESH;  Surgeon: Michael Boston, MD;  Location: [facility] ORS;  Service: General;  Laterality: N/A;  . KNEE SURGERY      Family History  Problem Relation Age of Onset  . Cancer Sister     Allergies  Allergen Reactions  . Sulfa Antibiotics Itching  . Tape Itching    Causes burn marks   . Clotrimazole     Made rash worse  . Eggs Or Egg-Derived Products  Hives    mouth and throat itching  . Hydrocodone Itching  . Shellfish Allergy Hives    Mouth and throat itching    Outpatient Medications Prior to Visit  Medication Sig Dispense Refill  . albuterol (PROVENTIL HFA;VENTOLIN HFA) 108 (90 Base) MCG/ACT inhaler Inhale 2 puffs into the lungs every 4 (four) hours as needed for wheezing or shortness of breath. 1 Inhaler 1  . Cholecalciferol (VITAMIN D3) 5000 units CAPS Take 10,000 Units by mouth daily.    . Cyanocobalamin (B-12 PO) Take 1 tablet by mouth daily.    . Cyanocobalamin POWD Take by mouth.    . EPINEPHrine (AUVI-Q) 0.3 mg/0.3 mL IJ SOAJ injection Inject 0.3 mLs (0.3 mg total) into the muscle as needed for anaphylaxis. 2 Device 2  . fluticasone (FLONASE) 50 MCG/ACT nasal spray Place 1 spray into both nostrils daily. 16 g 2  . levocetirizine (XYZAL) 5 MG tablet Take 1 tablet (5 mg total) by mouth every evening. 30 tablet 11  . Melatonin 5 MG TABS Take 5 mg by mouth at bedtime as needed (sleep).    . Olopatadine HCl (PAZEO) 0.7 % SOLN Apply 1 drop to eye daily as needed. 1 Bottle 5  . pantoprazole (PROTONIX) 40 MG tablet Take 1 tablet (40 mg total) by mouth daily. 20 tablet 1  . naproxen (NAPROSYN) 500 MG tablet Take 1 tablet (500 mg total) by mouth 2 (  two) times daily with a meal. (Patient not taking: Reported on 01/10/2020) 20 tablet 0  . ondansetron (ZOFRAN) 4 MG tablet Take 1 tablet (4 mg total) by mouth every 8 (eight) hours as needed for nausea. (Patient not taking: Reported on 01/11/2019) 5 tablet 10  . promethazine (PHENERGAN) 25 MG suppository Place 1 suppository (25 mg total) rectally every 6 (six) hours as needed for nausea. (Patient not taking: Reported on 01/11/2019) 3 suppository 10   No facility-administered medications prior to visit.     ROS Review of Systems  Constitutional: Negative for activity change, appetite change and fatigue.  HENT: Negative for congestion, sinus pressure and sore throat.   Eyes: Negative for  visual disturbance.  Respiratory: Negative for cough, chest tightness, shortness of breath and wheezing.   Cardiovascular: Negative for chest pain and palpitations.  Gastrointestinal: Negative for abdominal distention, abdominal pain and constipation.  Endocrine: Negative for polydipsia.  Genitourinary: Negative for dysuria and frequency.  Musculoskeletal: Negative for arthralgias and back pain.  Skin: Negative for rash.  Neurological: Negative for tremors, light-headedness and numbness.  Hematological: Does not bruise/bleed easily.  Psychiatric/Behavioral: Negative for agitation and behavioral problems.    Objective:  BP 125/78   Pulse 73   Ht '5\' 3"'  (1.6 m)   Wt 190 lb (86.2 kg)   SpO2 100%   BMI 33.66 kg/m   BP/Weight 02/07/2020 01/11/2019 17/35/6701  Systolic BP 410 301 314  Diastolic BP 78 82 83  Wt. (Lbs) 190 170 -  BMI 33.66 30.11 -      Physical Exam Constitutional:      Appearance: She is well-developed.  Neck:     Vascular: No JVD.  Cardiovascular:     Rate and Rhythm: Normal rate.     Heart sounds: Normal heart sounds. No murmur.  Pulmonary:     Effort: Pulmonary effort is normal.     Breath sounds: Normal breath sounds. No wheezing or rales.  Chest:     Chest wall: No tenderness.  Abdominal:     General: Bowel sounds are normal. There is no distension.     Palpations: Abdomen is soft. There is no mass.     Tenderness: There is no abdominal tenderness.  Musculoskeletal:        General: Normal range of motion.     Right lower leg: No edema.     Left lower leg: No edema.  Neurological:     Mental Status: She is alert and oriented to person, place, and time.  Psychiatric:        Mood and Affect: Mood normal.     CMP Latest Ref Rng & Units 10/17/2018 10/16/2018 10/09/2018  Glucose 70 - 99 mg/dL - 137(H) 86  BUN 6 - 20 mg/dL - 28(H) 15  Creatinine 0.44 - 1.00 mg/dL 0.92 1.73(H) 0.98  Sodium 135 - 145 mmol/L - 138 141  Potassium 3.5 - 5.1 mmol/L 4.5 4.3  4.1  Chloride 98 - 111 mmol/L - 104 107  CO2 22 - 32 mmol/L - 22 26  Calcium 8.9 - 10.3 mg/dL - 8.8(L) 9.2  Total Protein 6.5 - 8.1 g/dL - - -  Total Bilirubin 0.3 - 1.2 mg/dL - - -  Alkaline Phos 38 - 126 U/L - - -  AST 15 - 41 U/L - - -  ALT 14 - 54 U/L - - -    Lipid Panel     Component Value Date/Time   CHOL  10/23/2009 0730  132        ATP III CLASSIFICATION:  <200     mg/dL   Desirable  200-239  mg/dL   Borderline High  >=240    mg/dL   High          TRIG 44 10/23/2009 0730   HDL 41 10/23/2009 0730   CHOLHDL 3.2 10/23/2009 0730   VLDL 9 10/23/2009 0730   LDLCALC  10/23/2009 0730    82        Total Cholesterol/HDL:CHD Risk Coronary Heart Disease Risk Table                     Men   Women  1/2 Average Risk   3.4   3.3  Average Risk       5.0   4.4  2 X Average Risk   9.6   7.1  3 X Average Risk  23.4   11.0        Use the calculated Patient Ratio above and the CHD Risk Table to determine the patient's CHD Risk.        ATP III CLASSIFICATION (LDL):  <100     mg/dL   Optimal  100-129  mg/dL   Near or Above                    Optimal  130-159  mg/dL   Borderline  160-189  mg/dL   High  >190     mg/dL   Very High    CBC    Component Value Date/Time   WBC 16.0 (H) 10/18/2018 0421   RBC 3.38 (L) 10/18/2018 0421   HGB 10.2 (L) 10/18/2018 0421   HCT 30.8 (L) 10/18/2018 0421   PLT 221 10/18/2018 0421   MCV 91.1 10/18/2018 0421   MCH 30.2 10/18/2018 0421   MCHC 33.1 10/18/2018 0421   RDW 14.6 10/18/2018 0421   LYMPHSABS 3.7 07/10/2015 1725   MONOABS 0.5 07/10/2015 1725   EOSABS 0.2 07/10/2015 1725   BASOSABS 0.0 07/10/2015 1725    No results found for: HGBA1C  Assessment & Plan:  1. Palpitations EKG reveals normal sinus rhythm, right bundle branch block which is unchanged from previous EKG - TSH - T4, free  2. Other insomnia Uncontrolled Unable to tolerate trazodone due to worsening of sinus symptoms Hopefully sedative side effect of  hydroxyzine will be beneficial  3. Anxiety Initiate hydroxyzine - hydrOXYzine (ATARAX/VISTARIL) 25 MG tablet; Take 1 tablet (25 mg total) by mouth at bedtime as needed.  Dispense: 30 tablet; Refill: 2  4. Obesity, Class I, BMI 30-34.9 We have discussed increasing physical activity, reducing portion sizes Discussed that she would need to cut 3500 cal/week to amount of weight loss of 1 pound Discussed options including weight watchers, referral for nutrition.  She would like to try out weight watchers at this time. - CMP14+EGFR - Lipid panel  5. Sinus drainage Advised to use OTC antihistamines  6. Screening for viral disease - HCV RNA quant rflx ultra or genotyp([facility]) - HIV Antibody (routine testing w rflx)  Return in about 3 months (around 05/09/2020) for coordination of care.     Charlott Rakes, MD, FAAFP. [facility] and [place], [place]   02/07/2020, 8:58 AM

## 2020-02-07 NOTE — Patient Instructions (Signed)

## 2020-02-09 LAB — CMP14+EGFR
ALT: 16 IU/L (ref 0–32)
AST: 20 IU/L (ref 0–40)
Albumin/Globulin Ratio: 1.5 (ref 1.2–2.2)
Albumin: 4.5 g/dL (ref 3.8–4.9)
Alkaline Phosphatase: 104 IU/L (ref 39–117)
BUN/Creatinine Ratio: 13 (ref 9–23)
BUN: 12 mg/dL (ref 6–24)
Bilirubin Total: 0.3 mg/dL (ref 0.0–1.2)
CO2: 23 mmol/L (ref 20–29)
Calcium: 9.9 mg/dL (ref 8.7–10.2)
Chloride: 103 mmol/L (ref 96–106)
Creatinine, Ser: 0.92 mg/dL (ref 0.57–1.00)
GFR calc Af Amer: 80 mL/min/{1.73_m2} (ref >59)
GFR calc non Af Amer: 69 mL/min/{1.73_m2} (ref >59)
Globulin, Total: 3 g/dL (ref 1.5–4.5)
Glucose: 89 mg/dL (ref 65–99)
Potassium: 4.6 mmol/L (ref 3.5–5.2)
Sodium: 140 mmol/L (ref 134–144)
Total Protein: 7.5 g/dL (ref 6.0–8.5)

## 2020-02-09 LAB — HIV ANTIBODY (ROUTINE TESTING W REFLEX): HIV Screen 4th Generation wRfx: NONREACTIVE

## 2020-02-09 LAB — HCV RNA QUANT RFLX ULTRA OR GENOTYP: HCV Quant Baseline: NOT DETECTED IU/mL

## 2020-02-09 LAB — LIPID PANEL
Chol/HDL Ratio: 3.3 ratio (ref 0.0–4.4)
Cholesterol, Total: 222 mg/dL — ABNORMAL HIGH (ref 100–199)
HDL: 67 mg/dL (ref >39)
LDL Chol Calc (NIH): 141 mg/dL — ABNORMAL HIGH (ref 0–99)
Triglycerides: 81 mg/dL (ref 0–149)
VLDL Cholesterol Cal: 14 mg/dL (ref 5–40)

## 2020-02-09 LAB — T4, FREE: Free T4: 1.14 ng/dL (ref 0.82–1.77)

## 2020-02-09 LAB — TSH: TSH: 1.28 u[IU]/mL (ref 0.450–4.500)

## 2020-02-11 ENCOUNTER — Telehealth: Payer: Self-pay

## 2020-02-11 NOTE — Telephone Encounter (Signed)
-----   Message from Charlott Rakes, MD sent at 02/09/2020  2:26 PM EST ----- Cholesterol is elevated. Please advise to work on a low cholesterol diet, exercise. Thyroid labs, liver and kidney functions are normal.

## 2020-02-11 NOTE — Telephone Encounter (Signed)
Patient name and DOB has been verified Patient was informed of lab results. Patient had no questions.  

## 2020-02-29 ENCOUNTER — Other Ambulatory Visit: Payer: Self-pay | Admitting: Family Medicine

## 2020-02-29 DIAGNOSIS — F419 Anxiety disorder, unspecified: Secondary | ICD-10-CM

## 2020-03-28 ENCOUNTER — Other Ambulatory Visit: Payer: Self-pay | Admitting: Family Medicine

## 2020-03-28 DIAGNOSIS — Z1231 Encounter for screening mammogram for malignant neoplasm of breast: Secondary | ICD-10-CM

## 2020-03-29 ENCOUNTER — Other Ambulatory Visit: Payer: Self-pay | Admitting: Family Medicine

## 2020-03-29 DIAGNOSIS — F419 Anxiety disorder, unspecified: Secondary | ICD-10-CM

## 2020-05-08 ENCOUNTER — Other Ambulatory Visit: Payer: Self-pay

## 2020-05-08 ENCOUNTER — Ambulatory Visit
Admission: RE | Admit: 2020-05-08 | Discharge: 2020-05-08 | Disposition: A | Discharge status: Home / Self Care | Payer: No Typology Code available for payment source | Source: Ambulatory Visit | Attending: Family Medicine | Admitting: Family Medicine

## 2020-05-08 DIAGNOSIS — Z1231 Encounter for screening mammogram for malignant neoplasm of breast: Secondary | ICD-10-CM

## 2020-05-10 ENCOUNTER — Encounter: Payer: Self-pay | Admitting: Family Medicine

## 2020-05-10 ENCOUNTER — Ambulatory Visit: Payer: No Typology Code available for payment source | Attending: Family Medicine | Admitting: Family Medicine

## 2020-05-10 ENCOUNTER — Other Ambulatory Visit: Payer: Self-pay

## 2020-05-10 DIAGNOSIS — E7800 Pure hypercholesterolemia, unspecified: Secondary | ICD-10-CM | POA: Diagnosis not present

## 2020-05-10 DIAGNOSIS — G4709 Other insomnia: Secondary | ICD-10-CM

## 2020-05-10 DIAGNOSIS — F419 Anxiety disorder, unspecified: Secondary | ICD-10-CM

## 2020-05-10 DIAGNOSIS — Z6833 Body mass index (BMI) 33.0-33.9, adult: Secondary | ICD-10-CM

## 2020-05-10 DIAGNOSIS — Z91018 Allergy to other foods: Secondary | ICD-10-CM | POA: Diagnosis not present

## 2020-05-10 DIAGNOSIS — E6609 Other obesity due to excess calories: Secondary | ICD-10-CM

## 2020-05-10 MED ORDER — EPINEPHRINE 0.3 MG/0.3ML IJ SOAJ: 0.3000 mg | INTRAMUSCULAR | 2 refills | Status: DC | PRN

## 2020-05-10 MED ORDER — HYDROXYZINE HCL 25 MG PO TABS: 25.0000 mg | ORAL_TABLET | Freq: Every evening | ORAL | 1 refills | Status: DC | PRN

## 2020-05-10 MED ORDER — PANTOPRAZOLE SODIUM 40 MG PO TBEC: 40.0000 mg | DELAYED_RELEASE_TABLET | Freq: Every day | ORAL | 1 refills | Status: DC

## 2020-05-10 MED ORDER — LEVOCETIRIZINE DIHYDROCHLORIDE 5 MG PO TABS: 5.0000 mg | ORAL_TABLET | Freq: Every evening | ORAL | 11 refills | Status: DC

## 2020-05-10 NOTE — Progress Notes (Signed)
Need a new epipen.

## 2020-05-10 NOTE — Progress Notes (Signed)
Virtual Visit via Video Note  I connected with [employer], on 05/10/2020 at 8:49 AM by video enabled telemedicine device due to the COVID-19 pandemic and verified that I am speaking with the correct person using two identifiers.   Consent: I discussed the limitations, risks, security and privacy concerns of performing an evaluation and management service by telemedicine and the availability of in person appointments. I also discussed with the patient that there may be a patient responsible charge related to this service. The patient expressed understanding and agreed to proceed.   Location of Patient: Home  Location of Provider: Clinic   Persons participating in Telemedicine visit: Sonal Dorwart Farrington-CMA Dr. Margarita Rana     History of Present Illness: 59 year old female with a history of hypercholesterolemia, anxiety, insomnia, chronic sinusitis who presents today for follow-up visit. Her anxiety and insomnia have improved since commencement of hydroxyzine which she reports tolerating well. For chronic sinusitis she uses Xyzal regularly but Flonase intermittently. Requests a prescription for EpiPen as she recently discovered she is allergic to strawberries.  Currently trying to lose weight and is restricting her caloric intake however she is sedentary due to working from home. She has a treadmill at home which she tries to use intermittently. [facility] has noticed that her weight has plateaued. She is open to any form of assistance with weight loss.   Her last set of labs revealed elevated total cholesterol of 222, LDL of 141 continue cardiovascular risk of 4.4%. Statin not commenced but lifestyle modifications recommended. Past Medical History:  Diagnosis Date  . Arthritis   . Asthma   . Palpitations   . Shortness of breath    Allergies  Allergen Reactions  . Sulfa Antibiotics Itching  . Tape Itching    Causes burn marks    . Clotrimazole     Made rash worse  . Eggs Or Egg-Derived Products Hives    mouth and throat itching  . Hydrocodone Itching  . Shellfish Allergy Hives    Mouth and throat itching    Current Outpatient Medications on File Prior to Visit  Medication Sig Dispense Refill  . albuterol (PROVENTIL HFA;VENTOLIN HFA) 108 (90 Base) MCG/ACT inhaler Inhale 2 puffs into the lungs every 4 (four) hours as needed for wheezing or shortness of breath. 1 Inhaler 1  . Cholecalciferol (VITAMIN D3) 5000 units CAPS Take 10,000 Units by mouth daily.    . Cyanocobalamin (B-12 PO) Take 1 tablet by mouth daily.    . Cyanocobalamin POWD Take by mouth.    . fluticasone (FLONASE) 50 MCG/ACT nasal spray Place 1 spray into both nostrils daily. 16 g 2  . Melatonin 5 MG TABS Take 5 mg by mouth at bedtime as needed (sleep).    . Olopatadine HCl (PAZEO) 0.7 % SOLN Apply 1 drop to eye daily as needed. 1 Bottle 5  . naproxen (NAPROSYN) 500 MG tablet Take 1 tablet (500 mg total) by mouth 2 (two) times daily with a meal. (Patient not taking: Reported on 01/10/2020) 20 tablet 0  . ondansetron (ZOFRAN) 4 MG tablet Take 1 tablet (4 mg total) by mouth every 8 (eight) hours as needed for nausea. (Patient not taking: Reported on 01/11/2019) 5 tablet 10  . promethazine (PHENERGAN) 25 MG suppository Place 1 suppository (25 mg total) rectally every 6 (six) hours as needed for nausea. (Patient not taking: Reported on 01/11/2019) 3 suppository 10   No current facility-administered medications on file  prior to visit.    Observations/Objective: Awake, alert, oriented x3 Not in acute distress  CMP Latest Ref Rng & Units 02/07/2020 10/17/2018 10/16/2018  Glucose 65 - 99 mg/dL 89 - 137(H)  BUN 6 - 24 mg/dL 12 - 28(H)  Creatinine 0.57 - 1.00 mg/dL 0.92 0.92 1.73(H)  Sodium 134 - 144 mmol/L 140 - 138  Potassium 3.5 - 5.2 mmol/L 4.6 4.5 4.3  Chloride 96 - 106 mmol/L 103 - 104  CO2 20 - 29 mmol/L 23 - 22  Calcium 8.7 - 10.2 mg/dL 9.9 - 8.8(L)   Total Protein 6.0 - 8.5 g/dL 7.5 - -  Total Bilirubin 0.0 - 1.2 mg/dL 0.3 - -  Alkaline Phos 39 - 117 IU/L 104 - -  AST 0 - 40 IU/L 20 - -  ALT 0 - 32 IU/L 16 - -    Lipid Panel     Component Value Date/Time   CHOL 222 (H) 02/07/2020 1003   TRIG 81 02/07/2020 1003   HDL 67 02/07/2020 1003   CHOLHDL 3.3 02/07/2020 1003   CHOLHDL 3.2 10/23/2009 0730   VLDL 9 10/23/2009 0730   LDLCALC 141 (H) 02/07/2020 1003   LABVLDL 14 02/07/2020 1003    The 10-year ASCVD risk score Mikey Bussing DC Jr., et al., 2013) is: 4.4%   Values used to calculate the score:     Age: 47 years     Sex: Female     Is Non-Hispanic African American: Yes     Diabetic: No     Tobacco smoker: No     Systolic Blood Pressure: 601 mmHg     Is BP treated: No     HDL Cholesterol: 67 mg/dL     Total Cholesterol: 222 mg/dL  Assessment and Plan: 1. Hypercholesterolemia Uncontrolled 10-year ASCVD risk score of 4.4% We will hold off on medications Continue lifestyle modification and will recheck at next visit  2. Anxiety controlled - hydrOXYzine (ATARAX/VISTARIL) 25 MG tablet; Take 1 tablet (25 mg total) by mouth at bedtime as needed.  Dispense: 90 tablet; Refill: 1  3. Other insomnia Controlled - hydrOXYzine (ATARAX/VISTARIL) 25 MG tablet; Take 1 tablet (25 mg total) by mouth at bedtime as needed.  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  Dispense: 1 each; Refill: 2  5. Class 1 obesity due to excess calories without serious comorbidity with body mass index (BMI) of 33.0 to 33.9 in adult - Amb Ref to Medical Weight Management   Follow Up Instructions: 6 months for chronic disease management   I discussed the assessment and treatment plan with the patient. The patient was provided an opportunity to ask questions and all were answered. The patient agreed  with the plan and demonstrated an understanding of the instructions.   The patient was advised to call back or seek an in-person evaluation if the symptoms worsen or if the condition fails to improve as anticipated.     I provided 22 minutes total of Telehealth time during this encounter including median intraservice time, reviewing previous notes, investigations, ordering medications, medical decision making, coordinating care and patient verbalized understanding at the end of the visit.     Charlott Rakes, MD, FAAFP. [facility] and [place], [place]   05/10/2020, 8:49 AM

## 2020-05-11 ENCOUNTER — Telehealth: Payer: Self-pay

## 2020-05-11 NOTE — Telephone Encounter (Signed)
Patient was called and a voicemail was left informing patient to return phone call for lab results. 

## 2020-05-11 NOTE — Telephone Encounter (Signed)
-----   Message from Charlott Rakes, MD sent at 05/10/2020  1:31 PM EDT ----- Mammogram is negative for malignancy

## 2020-05-25 ENCOUNTER — Ambulatory Visit (INDEPENDENT_AMBULATORY_CARE_PROVIDER_SITE_OTHER): Payer: BLUE CROSS/BLUE SHIELD | Admitting: Family Medicine

## 2020-06-01 ENCOUNTER — Ambulatory Visit (INDEPENDENT_AMBULATORY_CARE_PROVIDER_SITE_OTHER): Payer: No Typology Code available for payment source | Admitting: Family Medicine

## 2020-06-01 ENCOUNTER — Other Ambulatory Visit: Payer: Self-pay

## 2020-06-01 ENCOUNTER — Encounter (INDEPENDENT_AMBULATORY_CARE_PROVIDER_SITE_OTHER): Payer: Self-pay | Admitting: Family Medicine

## 2020-06-01 VITALS — BP 134/83 | HR 73 | Temp 97.9°F | Ht 63.0 in | Wt 186.0 lb

## 2020-06-01 DIAGNOSIS — F419 Anxiety disorder, unspecified: Secondary | ICD-10-CM

## 2020-06-01 DIAGNOSIS — Z6833 Body mass index (BMI) 33.0-33.9, adult: Secondary | ICD-10-CM

## 2020-06-01 DIAGNOSIS — Z9189 Other specified personal risk factors, not elsewhere classified: Secondary | ICD-10-CM | POA: Diagnosis not present

## 2020-06-01 DIAGNOSIS — R5383 Other fatigue: Secondary | ICD-10-CM | POA: Diagnosis not present

## 2020-06-01 DIAGNOSIS — E559 Vitamin D deficiency, unspecified: Secondary | ICD-10-CM

## 2020-06-01 DIAGNOSIS — F329 Major depressive disorder, single episode, unspecified: Secondary | ICD-10-CM

## 2020-06-01 DIAGNOSIS — E669 Obesity, unspecified: Secondary | ICD-10-CM

## 2020-06-01 DIAGNOSIS — F32A Depression, unspecified: Secondary | ICD-10-CM

## 2020-06-01 DIAGNOSIS — Z0289 Encounter for other administrative examinations: Secondary | ICD-10-CM

## 2020-06-01 DIAGNOSIS — R0602 Shortness of breath: Secondary | ICD-10-CM | POA: Diagnosis not present

## 2020-06-01 DIAGNOSIS — K219 Gastro-esophageal reflux disease without esophagitis: Secondary | ICD-10-CM | POA: Diagnosis not present

## 2020-06-01 DIAGNOSIS — E7849 Other hyperlipidemia: Secondary | ICD-10-CM

## 2020-06-01 NOTE — Progress Notes (Addendum)
Dear Dr. Margarita Mcconnell,   Thank you for referring Angela Mcconnell to our clinic. The following note includes my evaluation and treatment recommendations.  Chief Complaint:   OBESITY Angela Mcconnell (MR# [id number]) is a 59 y.o. female who presents for evaluation and treatment of obesity and related comorbidities. Current BMI is Body mass index is 32.95 kg/m. Angela Mcconnell has been struggling with her weight for many years and has been unsuccessful in either losing weight, maintaining weight loss, or reaching her healthy weight goal.  Angela Mcconnell is currently in the action stage of change and ready to dedicate time achieving and maintaining a healthier weight. Angela Mcconnell is interested in becoming our patient and working on intensive lifestyle modifications including (but not limited to) diet and exercise for weight loss.  Angela Mcconnell works from home.  She lives with her significant other, Angela Mcconnell (age 85).  She exercises by going to the Angela Mcconnell 3 times per week.  She craves sweets and skips meals.  She has food allergies to eggs and shellfish.    Angela Mcconnell's habits were reviewed today and are as follows: her desired weight loss is 39 pounds, she started gaining weight at age 31 after her hysterectomy, her heaviest weight ever was 192 pounds, she is a picky eater and doesn't like to eat healthier foods, she craves sweets, she snacks frequently in the evenings, she skips breakfast and/or lunch frequently, she is frequently drinking liquids with calories, she frequently makes poor food choices, she has problems with excessive hunger, she frequently eats larger portions than normal and she struggles with emotional eating.  Depression Screen Angela Mcconnell's Food and Mood (modified PHQ-9) score was 10.  Depression screen PHQ 2/9 06/01/2020  Decreased Interest 1  Down, Depressed, Hopeless 2  PHQ - 2 Score 3  Altered sleeping 0  Tired, decreased energy 2  Change in appetite 3  Feeling bad or failure about yourself  1  Trouble  concentrating 1  Moving slowly or fidgety/restless 0  Suicidal thoughts 0  PHQ-9 Score 10  Difficult doing work/chores Not difficult at all   Subjective:   1. Other fatigue Angela Mcconnell denies daytime somnolence and reports waking up still tired. Patent has a history of symptoms of morning fatigue and morning headache. Angela Mcconnell generally gets 6 or 7 hours of sleep per night, and states that she has poor sleep quality. Snoring is sometimes present. Apneic episodes are not present. Epworth Sleepiness Score is 5.  2. SOB (shortness of breath) on exertion Angela Mcconnell notes increasing shortness of breath with exercising and seems to be worsening over time with weight gain. She notes getting out of breath sooner with activity than she used to. This has gotten worse recently. Angela Mcconnell denies shortness of breath at rest or orthopnea.  3. Vitamin D deficiency She is currently taking OTC vitamin D 2000 IU each day. She denies nausea, vomiting or muscle weakness.  4. Gastroesophageal reflux disease, unspecified whether esophagitis present Angela Mcconnell has history of a hiatal hernia status post surgery.  That improved her symptoms, but she is still taking Protonix 40 mg daily.  5. Other hyperlipidemia Angela Mcconnell has hyperlipidemia and has been trying to improve her cholesterol levels with intensive lifestyle modification including a low saturated fat diet, exercise and weight loss. She denies any chest pain, claudication or myalgias.  Lab Results  Component Value Date   ALT 16 02/07/2020   AST 20 02/07/2020   ALKPHOS 104 02/07/2020   BILITOT 0.3 02/07/2020   Lab Results  Component Value Date  CHOL 222 (H) 02/07/2020   HDL 67 02/07/2020   LDLCALC 141 (H) 02/07/2020   TRIG 81 02/07/2020   CHOLHDL 3.3 02/07/2020   6. Anxiety and depression, with emotional eating Angela Mcconnell is taking hydroxyzine for insomnia.  Angela Mcconnell is struggling with emotional eating and using food for comfort to the extent that it is  negatively impacting her health. She has been working on behavior modification techniques to help reduce her emotional eating and has been unsuccessful. She shows no sign of suicidal or homicidal ideations.  PHQ-9 is 10.  Assessment/Plan:   1. Other fatigue Angela Mcconnell does feel that her weight is causing her energy to be lower than it should be. Fatigue may be related to obesity, depression or many other causes. Labs will be ordered, and in the meanwhile, Angela Mcconnell will focus on self care including making healthy food choices, increasing physical activity and focusing on stress reduction.  Orders - EKG 12-Lead - Anemia panel - Hemoglobin A1c - Insulin, random - T3 - T4, free - TSH  2. SOB (shortness of breath) on exertion Angela Mcconnell does feel that she gets out of breath more easily that she used to when she exercises. Angela Mcconnell's shortness of breath appears to be obesity related and exercise induced. She has agreed to work on weight loss and gradually increase exercise to treat her exercise induced shortness of breath. Will continue to monitor closely.  3. Vitamin D deficiency Low Vitamin D level contributes to fatigue and are associated with obesity, breast, and colon cancer. She agrees to continue to take OTC Vitamin D @2 ,000 IU daily and will follow-up for routine testing of Vitamin D, at least 2-3 times per year to avoid over-replacement.  Orders - VITAMIN D 25 Hydroxy (Vit-D Deficiency, Fractures)  4. Gastroesophageal reflux disease, unspecified whether esophagitis present Intensive lifestyle modifications are the first line treatment for this issue. We discussed several lifestyle modifications today and she will continue to work on diet, exercise and weight loss efforts. Orders and follow up as documented in patient record.   Counseling . If a person has gastroesophageal reflux disease (GERD), food and stomach acid move back up into the esophagus and cause symptoms or problems such as damage  to the esophagus. . Anti-reflux measures include: raising the head of the bed, avoiding tight clothing or belts, avoiding eating late at night, not lying down shortly after mealtime, and achieving weight loss. . Avoid ASA, NSAID's, caffeine, alcohol, and tobacco.  . OTC Pepcid and/or Tums are often very helpful for as needed use.  Marland Kitchen However, for persisting chronic or daily symptoms, stronger medications like Omeprazole may be needed. . You may need to avoid foods and drinks such as: ? Coffee and tea (with or without caffeine). ? Drinks that contain alcohol. ? Energy drinks and sports drinks. ? Bubbly (carbonated) drinks or sodas. ? Chocolate and cocoa. ? Peppermint and mint flavorings. ? Garlic and onions. ? Horseradish. ? Spicy and acidic foods. These include peppers, chili powder, curry powder, vinegar, hot sauces, and BBQ sauce. ? Citrus fruit juices and citrus fruits, such as oranges, lemons, and limes. ? Tomato-based foods. These include red sauce, chili, salsa, and pizza with red sauce. ? Fried and fatty foods. These include donuts, french fries, potato chips, and high-fat dressings. ? High-fat meats. These include hot dogs, rib eye steak, sausage, ham, and bacon.  5. Other hyperlipidemia Cardiovascular risk and specific lipid/LDL goals reviewed.  We discussed several lifestyle modifications today and Angela Mcconnell will continue to work  on diet, exercise and weight loss efforts. Orders and follow up as documented in patient record.   Counseling Intensive lifestyle modifications are the first line treatment for this issue. . Dietary changes: Increase soluble fiber. Decrease simple carbohydrates. . Exercise changes: Moderate to vigorous-intensity aerobic activity 150 minutes per week if tolerated. . Lipid-lowering medications: see documented in medical record.  Orders - Comprehensive metabolic panel - CBC with Differential/Platelet - Lipid Panel With LDL/HDL Ratio  6. Anxiety and  depression, with emotional eating Behavior modification techniques were discussed today to help Angela Mcconnell deal with her emotional/non-hunger eating behaviors.  Orders and follow up as documented in patient record.   7. At risk for heart disease Angela Mcconnell was given approximately 15 minutes of coronary artery disease prevention counseling today. She is 59 y.o. female and has risk factors for heart disease including obesity. We discussed intensive lifestyle modifications today with an emphasis on specific weight loss instructions and strategies.   Repetitive spaced learning was employed today to elicit superior memory formation and behavioral change.  8. Class 1 obesity with serious comorbidity and body mass index (BMI) of 33.0 to 33.9 in adult, unspecified obesity type Angela Mcconnell is currently in the action stage of change and her goal is to continue with weight loss efforts. I recommend Angela Mcconnell begin the structured treatment plan as follows:  She has agreed to the Category 2 Plan.  Note:  Angela Mcconnell does not eat breakfast, so added 200 calories to snacks.  Exercise goals: For substantial health benefits, adults should do at least 150 minutes (2 hours and 30 minutes) a week of moderate-intensity, or 75 minutes (1 hour and 15 minutes) a week of vigorous-intensity aerobic physical activity, or an equivalent combination of moderate- and vigorous-intensity aerobic activity. Aerobic activity should be performed in episodes of at least 10 minutes, and preferably, it should be spread throughout the week.   Behavioral modification strategies: increasing lean protein intake and decreasing simple carbohydrates.  She was informed of the importance of frequent follow-up visits to maximize her success with intensive lifestyle modifications for her multiple health conditions. She was informed we would discuss her lab results at her next visit unless there is a critical issue that needs to be addressed sooner. Angela Mcconnell agreed  to keep her next visit at the agreed upon time to discuss these results.  Objective:   Blood pressure 134/83, pulse 73, temperature 97.9 F (36.6 C), temperature source Oral, height 5\' 3"  (1.6 m), weight 186 lb (84.4 kg), SpO2 100 %. Body mass index is 32.95 kg/m.  EKG: Normal sinus rhythm, rate 71 bpm.  Indirect Calorimeter completed today shows a VO2 of 231 and a REE of 1609.  Her calculated basal metabolic rate is 2423 thus her basal metabolic rate is better than expected.  General: Cooperative, alert, well developed, in no acute distress. HEENT: Conjunctivae and lids unremarkable. Cardiovascular: Regular rhythm.  Lungs: Normal work of breathing. Neurologic: No focal deficits.   Lab Results  Component Value Date   CREATININE 0.92 02/07/2020   BUN 12 02/07/2020   NA 140 02/07/2020   K 4.6 02/07/2020   CL 103 02/07/2020   CO2 23 02/07/2020   Lab Results  Component Value Date   ALT 16 02/07/2020   AST 20 02/07/2020   ALKPHOS 104 02/07/2020   BILITOT 0.3 02/07/2020   Lab Results  Component Value Date   TSH 1.280 02/07/2020   Lab Results  Component Value Date   CHOL 222 (H) 02/07/2020   HDL  67 02/07/2020   LDLCALC 141 (H) 02/07/2020   TRIG 81 02/07/2020   CHOLHDL 3.3 02/07/2020   Lab Results  Component Value Date   WBC 16.0 (H) 10/18/2018   HGB 10.2 (L) 10/18/2018   HCT 30.8 (L) 10/18/2018   MCV 91.1 10/18/2018   PLT 221 10/18/2018   Attestation Statements:   This is the patient's first visit at [facility]. The patient's NEW PATIENT PACKET was reviewed at length. Included in the packet: current and past health history, medications, allergies, ROS, gynecologic history (women only), surgical history, family history, social history, weight history, weight loss surgery history (for those that have had weight loss surgery), nutritional evaluation, mood and food questionnaire, PHQ9, Epworth questionnaire, sleep habits questionnaire, patient life and  health improvement goals questionnaire. These will all be scanned into the patient's chart under media.   During the visit, I independently reviewed the patient's EKG, bioimpedance scale results, and indirect calorimeter results. I used this information to tailor a meal plan for the patient that will help her to lose weight and will improve her obesity-related conditions going forward. I performed a medically necessary appropriate examination and/or evaluation. I discussed the assessment and treatment plan with the patient. The patient was provided an opportunity to ask questions and all were answered. The patient agreed with the plan and demonstrated an understanding of the instructions. Labs were ordered at this visit and will be reviewed at the next visit unless more critical results need to be addressed immediately. Clinical information was updated and documented in the EMR.   I, Water quality scientist, CMA, am acting as transcriptionist for Briscoe Deutscher, DO  I have reviewed the above documentation for accuracy and completeness, and I agree with the above. Briscoe Deutscher, DO

## 2020-06-02 LAB — COMPREHENSIVE METABOLIC PANEL
ALT: 14 IU/L (ref 0–32)
AST: 20 IU/L (ref 0–40)
Albumin/Globulin Ratio: 1.7 (ref 1.2–2.2)
Albumin: 4.8 g/dL (ref 3.8–4.9)
Alkaline Phosphatase: 104 IU/L (ref 48–121)
BUN/Creatinine Ratio: 12 (ref 9–23)
BUN: 10 mg/dL (ref 6–24)
Bilirubin Total: 0.4 mg/dL (ref 0.0–1.2)
CO2: 24 mmol/L (ref 20–29)
Calcium: 9.8 mg/dL (ref 8.7–10.2)
Chloride: 103 mmol/L (ref 96–106)
Creatinine, Ser: 0.85 mg/dL (ref 0.57–1.00)
GFR calc Af Amer: 88 mL/min/{1.73_m2} (ref >59)
GFR calc non Af Amer: 76 mL/min/{1.73_m2} (ref >59)
Globulin, Total: 2.9 g/dL (ref 1.5–4.5)
Glucose: 81 mg/dL (ref 65–99)
Potassium: 4.2 mmol/L (ref 3.5–5.2)
Sodium: 141 mmol/L (ref 134–144)
Total Protein: 7.7 g/dL (ref 6.0–8.5)

## 2020-06-02 LAB — CBC WITH DIFFERENTIAL/PLATELET
Basophils Absolute: 0 10*3/uL (ref 0.0–0.2)
Basos: 0 %
EOS (ABSOLUTE): 0.2 10*3/uL (ref 0.0–0.4)
Eos: 2 %
Hemoglobin: 12.8 g/dL (ref 11.1–15.9)
Immature Grans (Abs): 0 10*3/uL (ref 0.0–0.1)
Immature Granulocytes: 0 %
Lymphocytes Absolute: 3.3 10*3/uL — ABNORMAL HIGH (ref 0.7–3.1)
Lymphs: 39 %
MCH: 29.9 pg (ref 26.6–33.0)
MCHC: 31.9 g/dL (ref 31.5–35.7)
MCV: 94 fL (ref 79–97)
Monocytes Absolute: 0.5 10*3/uL (ref 0.1–0.9)
Monocytes: 6 %
Neutrophils Absolute: 4.5 10*3/uL (ref 1.4–7.0)
Neutrophils: 53 %
Platelets: 319 10*3/uL (ref 150–450)
RBC: 4.28 x10E6/uL (ref 3.77–5.28)
RDW: 12.8 % (ref 11.7–15.4)
WBC: 8.6 10*3/uL (ref 3.4–10.8)

## 2020-06-02 LAB — LIPID PANEL WITH LDL/HDL RATIO
Cholesterol, Total: 226 mg/dL — ABNORMAL HIGH (ref 100–199)
HDL: 59 mg/dL (ref >39)
LDL Chol Calc (NIH): 147 mg/dL — ABNORMAL HIGH (ref 0–99)
LDL/HDL Ratio: 2.5 ratio (ref 0.0–3.2)
Triglycerides: 111 mg/dL (ref 0–149)
VLDL Cholesterol Cal: 20 mg/dL (ref 5–40)

## 2020-06-02 LAB — ANEMIA PANEL
Ferritin: 228 ng/mL — ABNORMAL HIGH (ref 15–150)
Folate, Hemolysate: 402 ng/mL
Folate, RBC: 1002 ng/mL (ref >498)
Hematocrit: 40.1 % (ref 34.0–46.6)
Iron Saturation: 30 % (ref 15–55)
Iron: 95 ug/dL (ref 27–159)
Retic Ct Pct: 1.8 % (ref 0.6–2.6)
Total Iron Binding Capacity: 315 ug/dL (ref 250–450)
UIBC: 220 ug/dL (ref 131–425)
Vitamin B-12: 1112 pg/mL (ref 232–1245)

## 2020-06-02 LAB — HEMOGLOBIN A1C
Est. average glucose Bld gHb Est-mCnc: 108 mg/dL
Hgb A1c MFr Bld: 5.4 % (ref 4.8–5.6)

## 2020-06-02 LAB — VITAMIN D 25 HYDROXY (VIT D DEFICIENCY, FRACTURES): Vit D, 25-Hydroxy: 39.6 ng/mL (ref 30.0–100.0)

## 2020-06-02 LAB — INSULIN, RANDOM: INSULIN: 10.8 u[IU]/mL (ref 2.6–24.9)

## 2020-06-02 LAB — TSH: TSH: 1.07 u[IU]/mL (ref 0.450–4.500)

## 2020-06-02 LAB — T3: T3, Total: 108 ng/dL (ref 71–180)

## 2020-06-02 LAB — T4, FREE: Free T4: 1.1 ng/dL (ref 0.82–1.77)

## 2020-06-08 ENCOUNTER — Ambulatory Visit (INDEPENDENT_AMBULATORY_CARE_PROVIDER_SITE_OTHER): Payer: BLUE CROSS/BLUE SHIELD | Admitting: Family Medicine

## 2020-06-15 ENCOUNTER — Encounter (INDEPENDENT_AMBULATORY_CARE_PROVIDER_SITE_OTHER): Payer: Self-pay | Admitting: Family Medicine

## 2020-06-15 ENCOUNTER — Other Ambulatory Visit: Payer: Self-pay

## 2020-06-15 ENCOUNTER — Ambulatory Visit (INDEPENDENT_AMBULATORY_CARE_PROVIDER_SITE_OTHER): Payer: No Typology Code available for payment source | Admitting: Family Medicine

## 2020-06-15 VITALS — BP 117/80 | HR 98 | Temp 98.8°F | Ht 63.0 in | Wt 184.0 lb

## 2020-06-15 DIAGNOSIS — E7800 Pure hypercholesterolemia, unspecified: Secondary | ICD-10-CM | POA: Diagnosis not present

## 2020-06-15 DIAGNOSIS — Z6832 Body mass index (BMI) 32.0-32.9, adult: Secondary | ICD-10-CM

## 2020-06-15 DIAGNOSIS — E669 Obesity, unspecified: Secondary | ICD-10-CM

## 2020-06-15 DIAGNOSIS — Z9189 Other specified personal risk factors, not elsewhere classified: Secondary | ICD-10-CM | POA: Diagnosis not present

## 2020-06-15 DIAGNOSIS — E8881 Metabolic syndrome: Secondary | ICD-10-CM

## 2020-06-15 MED ORDER — WEGOVY 0.25 MG/0.5ML ~~LOC~~ SOAJ: 0.2500 mg | SUBCUTANEOUS | 0 refills | Status: DC

## 2020-06-15 NOTE — Progress Notes (Signed)
Chief Complaint:   OBESITY Angela Mcconnell is here to discuss her progress with her obesity treatment plan along with follow-up of her obesity related diagnoses. Angela Mcconnell is on the Category 2 Plan and states she is following her eating plan approximately 60% of the time. Angela Mcconnell states she is doing cardio for 30 minutes 2 times per week and walking 2 miles 7 times per week.  Today's visit was #: 2 Starting weight: 186 lbs Starting date: 06/01/2020 Today's weight: 184 lbs Today's date: 06/15/2020 Total lbs lost to date: 2 lbs Total lbs lost since last in-office visit: 2 lbs  Interim History: Angela Mcconnell says she is still hungry around 4 pm and 10 pm.  She is getting in the protein and water (1 gallon).    Subjective:   1. Insulin resistance [place] has a diagnosis of insulin resistance based on her elevated fasting insulin level >5. She continues to work on diet and exercise to decrease her risk of diabetes.  Lab Results  Component Value Date   INSULIN 10.8 06/01/2020   Lab Results  Component Value Date   HGBA1C 5.4 06/01/2020   2. Pure hypercholesterolemia Angela Mcconnell has hypercholesterolemia and has been trying to improve her cholesterol levels with intensive lifestyle modification including a low saturated fat diet, exercise and weight loss. She denies any chest pain, claudication or myalgias.  Lab Results  Component Value Date   ALT 14 06/01/2020   AST 20 06/01/2020   ALKPHOS 104 06/01/2020   BILITOT 0.4 06/01/2020   Lab Results  Component Value Date   CHOL 226 (H) 06/01/2020   HDL 59 06/01/2020   LDLCALC 147 (H) 06/01/2020   TRIG 111 06/01/2020   CHOLHDL 3.3 02/07/2020   3. At risk for heart disease Angela Mcconnell is at a higher than average risk for cardiovascular disease due to obesity.   Assessment/Plan:   1. Insulin resistance Angela Mcconnell will continue to work on weight loss, exercise, and decreasing simple carbohydrates to help decrease the risk of diabetes. Angela Mcconnell agreed to  follow-up with [place] as directed to closely monitor her progress.  2. Pure hypercholesterolemia Cardiovascular risk and specific lipid/LDL goals reviewed.  We discussed several lifestyle modifications today and Angela Mcconnell will continue to work on diet, exercise and weight loss efforts. Orders and follow up as documented in patient record.   Counseling Intensive lifestyle modifications are the first line treatment for this issue. . Dietary changes: Increase soluble fiber. Decrease simple carbohydrates. . Exercise changes: Moderate to vigorous-intensity aerobic activity 150 minutes per week if tolerated. . Lipid-lowering medications: see documented in medical record.  3. At risk for heart disease Angela Mcconnell was given approximately 15 minutes of coronary artery disease prevention counseling today. She is 59 y.o. female and has risk factors for heart disease including obesity. We discussed intensive lifestyle modifications today with an emphasis on specific weight loss instructions and strategies.   Repetitive spaced learning was employed today to elicit superior memory formation and behavioral change.  4. Class 1 obesity with serious comorbidity and body mass index (BMI) of 32.0 to 32.9 in adult, unspecified obesity type  Orders - Semaglutide-Weight Management (WEGOVY) 0.25 MG/0.5ML SOAJ; Inject 0.25 mg into the skin once a week.  Dispense: 2 mL; Refill: 0  Angela Mcconnell is currently in the action stage of change. As such, her goal is to continue with weight loss efforts. She has agreed to the Category 2 Plan.   Exercise goals: For substantial health benefits, adults should do at least 150 minutes (  2 hours and 30 minutes) a week of moderate-intensity, or 75 minutes (1 hour and 15 minutes) a week of vigorous-intensity aerobic physical activity, or an equivalent combination of moderate- and vigorous-intensity aerobic activity. Aerobic activity should be performed in episodes of at least 10 minutes, and  preferably, it should be spread throughout the week. As is.  Behavioral modification strategies: increasing lean protein intake, decreasing simple carbohydrates and increasing high fiber foods.  Angela Mcconnell has agreed to follow-up with our clinic in 2 weeks. She was informed of the importance of frequent follow-up visits to maximize her success with intensive lifestyle modifications for her multiple health conditions.   Objective:   Blood pressure 117/80, pulse 98, temperature 98.8 F (37.1 C), temperature source Oral, height 5\' 3"  (1.6 m), weight 184 lb (83.5 kg), SpO2 98 %. Body mass index is 32.59 kg/m.  General: Cooperative, alert, well developed, in no acute distress. HEENT: Conjunctivae and lids unremarkable. Cardiovascular: Regular rhythm.  Lungs: Normal work of breathing. Neurologic: No focal deficits.   Lab Results  Component Value Date   CREATININE 0.85 06/01/2020   BUN 10 06/01/2020   NA 141 06/01/2020   K 4.2 06/01/2020   CL 103 06/01/2020   CO2 24 06/01/2020   Lab Results  Component Value Date   ALT 14 06/01/2020   AST 20 06/01/2020   ALKPHOS 104 06/01/2020   BILITOT 0.4 06/01/2020   Lab Results  Component Value Date   HGBA1C 5.4 06/01/2020   Lab Results  Component Value Date   INSULIN 10.8 06/01/2020   Lab Results  Component Value Date   TSH 1.070 06/01/2020   Lab Results  Component Value Date   CHOL 226 (H) 06/01/2020   HDL 59 06/01/2020   LDLCALC 147 (H) 06/01/2020   TRIG 111 06/01/2020   CHOLHDL 3.3 02/07/2020   Lab Results  Component Value Date   WBC 8.6 06/01/2020   HGB 12.8 06/01/2020   HCT 40.1 06/01/2020   MCV 94 06/01/2020   PLT 319 06/01/2020   Lab Results  Component Value Date   IRON 95 06/01/2020   TIBC 315 06/01/2020   FERRITIN 228 (H) 06/01/2020   Attestation Statements:   Reviewed by clinician on day of visit: allergies, medications, problem list, medical history, surgical history, family history, social history, and  previous encounter notes.  I, Water quality scientist, CMA, am acting as transcriptionist for Briscoe Deutscher, DO  I have reviewed the above documentation for accuracy and completeness, and I agree with the above. Briscoe Deutscher, DO

## 2020-06-19 ENCOUNTER — Other Ambulatory Visit (INDEPENDENT_AMBULATORY_CARE_PROVIDER_SITE_OTHER): Payer: Self-pay | Admitting: Family Medicine

## 2020-06-19 DIAGNOSIS — E669 Obesity, unspecified: Secondary | ICD-10-CM

## 2020-06-20 ENCOUNTER — Telehealth (INDEPENDENT_AMBULATORY_CARE_PROVIDER_SITE_OTHER): Payer: Self-pay | Admitting: Family Medicine

## 2020-06-20 NOTE — Telephone Encounter (Signed)
Activated new savings card and it went through, pt notified that pharmacy is ordering it and should hopefully have it tomorrow

## 2020-06-20 NOTE — Telephone Encounter (Signed)
Please see

## 2020-06-22 ENCOUNTER — Encounter (INDEPENDENT_AMBULATORY_CARE_PROVIDER_SITE_OTHER): Payer: Self-pay | Admitting: Family Medicine

## 2020-06-26 ENCOUNTER — Other Ambulatory Visit (INDEPENDENT_AMBULATORY_CARE_PROVIDER_SITE_OTHER): Payer: Self-pay

## 2020-06-26 DIAGNOSIS — Z6832 Body mass index (BMI) 32.0-32.9, adult: Secondary | ICD-10-CM

## 2020-06-26 DIAGNOSIS — E669 Obesity, unspecified: Secondary | ICD-10-CM

## 2020-06-26 MED ORDER — WEGOVY 0.25 MG/0.5ML ~~LOC~~ SOAJ: 0.2500 mg | SUBCUTANEOUS | 0 refills | Status: DC

## 2020-07-03 ENCOUNTER — Encounter (INDEPENDENT_AMBULATORY_CARE_PROVIDER_SITE_OTHER): Payer: Self-pay | Admitting: Family Medicine

## 2020-07-03 ENCOUNTER — Other Ambulatory Visit: Payer: Self-pay

## 2020-07-03 ENCOUNTER — Ambulatory Visit (INDEPENDENT_AMBULATORY_CARE_PROVIDER_SITE_OTHER): Payer: No Typology Code available for payment source | Admitting: Family Medicine

## 2020-07-03 VITALS — BP 120/76 | HR 79 | Temp 98.1°F | Ht 63.0 in | Wt 184.0 lb

## 2020-07-03 DIAGNOSIS — E8881 Metabolic syndrome: Secondary | ICD-10-CM | POA: Diagnosis not present

## 2020-07-03 DIAGNOSIS — E669 Obesity, unspecified: Secondary | ICD-10-CM | POA: Diagnosis not present

## 2020-07-03 DIAGNOSIS — Z9189 Other specified personal risk factors, not elsewhere classified: Secondary | ICD-10-CM

## 2020-07-03 DIAGNOSIS — E88819 Insulin resistance, unspecified: Secondary | ICD-10-CM | POA: Insufficient documentation

## 2020-07-03 DIAGNOSIS — Z6832 Body mass index (BMI) 32.0-32.9, adult: Secondary | ICD-10-CM

## 2020-07-03 NOTE — Progress Notes (Addendum)
Chief Complaint:   OBESITY Symphanie is here to discuss her progress with her obesity treatment plan along with follow-up of her obesity related diagnoses. Angela Mcconnell is on the Category 2 Plan and states she is following her eating plan approximately 50% of the time. Angela Mcconnell states she is walking for 30 minutes 6 times per week.  Today'Angela visit was #: 3 Starting weight: 186 lbs Starting date: 06/01/2020 Today'Angela weight: 184 lbs Today'Angela date: 07/03/2020 Total lbs lost to date: 2 Total lbs lost since last in-office visit: 0  Interim History: Angela Mcconnell started [employer] 3 days ago and she notes mild nausea and bloating which has now subsided. She is noticing appetite suppression. She does not like the [employer]. She does tend to overeat fruit.  We discussed her weight goal and decided on 150-155 lbs (26-27 BMI).  Subjective:   1. Insulin resistance Angela Mcconnell noticed decreased appetite and snacking since starting Wegovy.  2. At risk for constipation Angela Mcconnell is at increased risk for constipation due to start of Wegovy.  Assessment/Plan:   1. Insulin resistance Angela Mcconnell will continue to work on weight loss, exercise, and decreasing simple carbohydrates to help decrease the risk of diabetes. Angela Mcconnell agreed to continue [place], and he will to follow-up with [place] as directed to closely monitor her progress.  2. At risk for constipation Angela Mcconnell was given approximately 15 minutes of counseling today regarding prevention of constipation. She was encouraged to increase water and fiber intake.   3. Class 1 obesity with serious comorbidity and body mass index (BMI) of 32.0 to 32.9 in adult, unspecified obesity type Angela Mcconnell is currently in the action stage of change. As such, her goal is to continue with weight loss efforts. She has agreed to the Category 2 Plan.   We discussed various medication options to help Angela Mcconnell with her weight loss efforts and we both agreed to continue [facility] as is.  Angela Mcconnell  may substitute any other bread or crackers.  Exercise goals: As is.  Behavioral modification strategies: increasing lean protein intake and decreasing simple carbohydrates.  Angela Mcconnell has agreed to follow-up with our clinic in 2 weeks. She was informed of the importance of frequent follow-up visits to maximize her success with intensive lifestyle modifications for her multiple health conditions.   Objective:   Blood pressure 120/76, pulse 79, temperature 98.1 F (36.7 C), temperature source Oral, height 5\' 3"  (1.6 m), weight 184 lb (83.5 kg), SpO2 100 %. Body mass index is 32.59 kg/m.  General: Cooperative, alert, well developed, in no acute distress. HEENT: Conjunctivae and lids unremarkable. Cardiovascular: Regular rhythm.  Lungs: Normal work of breathing. Neurologic: No focal deficits.   Lab Results  Component Value Date   CREATININE 0.85 06/01/2020   BUN 10 06/01/2020   NA 141 06/01/2020   K 4.2 06/01/2020   CL 103 06/01/2020   CO2 24 06/01/2020   Lab Results  Component Value Date   ALT 14 06/01/2020   AST 20 06/01/2020   ALKPHOS 104 06/01/2020   BILITOT 0.4 06/01/2020   Lab Results  Component Value Date   HGBA1C 5.4 06/01/2020   Lab Results  Component Value Date   INSULIN 10.8 06/01/2020   Lab Results  Component Value Date   TSH 1.070 06/01/2020   Lab Results  Component Value Date   CHOL 226 (H) 06/01/2020   HDL 59 06/01/2020   LDLCALC 147 (H) 06/01/2020   TRIG 111 06/01/2020   CHOLHDL 3.3 02/07/2020   Lab Results  Component Value Date   WBC 8.6 06/01/2020   HGB 12.8 06/01/2020   HCT 40.1 06/01/2020   MCV 94 06/01/2020   PLT 319 06/01/2020   Lab Results  Component Value Date   IRON 95 06/01/2020   TIBC 315 06/01/2020   FERRITIN 228 (H) 06/01/2020   Attestation Statements:   Reviewed by clinician on day of visit: allergies, medications, problem list, medical history, surgical history, family history, social history, and previous encounter  notes.   Angela Mcconnell, am acting as Location manager for [employer], FNP-C.  I have reviewed the above documentation for accuracy and completeness, and I agree with the above. -  Georgianne Fick, FNP

## 2020-07-13 ENCOUNTER — Other Ambulatory Visit (INDEPENDENT_AMBULATORY_CARE_PROVIDER_SITE_OTHER): Payer: Self-pay | Admitting: Family Medicine

## 2020-07-13 DIAGNOSIS — E669 Obesity, unspecified: Secondary | ICD-10-CM

## 2020-07-14 ENCOUNTER — Other Ambulatory Visit (INDEPENDENT_AMBULATORY_CARE_PROVIDER_SITE_OTHER): Payer: Self-pay | Admitting: Family Medicine

## 2020-07-14 DIAGNOSIS — E669 Obesity, unspecified: Secondary | ICD-10-CM

## 2020-07-17 ENCOUNTER — Other Ambulatory Visit (INDEPENDENT_AMBULATORY_CARE_PROVIDER_SITE_OTHER): Payer: Self-pay | Admitting: Family Medicine

## 2020-07-17 DIAGNOSIS — E669 Obesity, unspecified: Secondary | ICD-10-CM

## 2020-07-18 ENCOUNTER — Ambulatory Visit (INDEPENDENT_AMBULATORY_CARE_PROVIDER_SITE_OTHER): Payer: No Typology Code available for payment source | Admitting: Family Medicine

## 2020-07-18 ENCOUNTER — Encounter (INDEPENDENT_AMBULATORY_CARE_PROVIDER_SITE_OTHER): Payer: Self-pay | Admitting: Family Medicine

## 2020-07-18 ENCOUNTER — Other Ambulatory Visit: Payer: Self-pay

## 2020-07-18 VITALS — BP 125/83 | HR 77 | Temp 98.1°F | Ht 63.0 in | Wt 180.0 lb

## 2020-07-18 DIAGNOSIS — E669 Obesity, unspecified: Secondary | ICD-10-CM | POA: Diagnosis not present

## 2020-07-18 DIAGNOSIS — E8881 Metabolic syndrome: Secondary | ICD-10-CM | POA: Diagnosis not present

## 2020-07-18 DIAGNOSIS — E559 Vitamin D deficiency, unspecified: Secondary | ICD-10-CM | POA: Diagnosis not present

## 2020-07-18 DIAGNOSIS — Z9189 Other specified personal risk factors, not elsewhere classified: Secondary | ICD-10-CM | POA: Diagnosis not present

## 2020-07-18 DIAGNOSIS — Z6832 Body mass index (BMI) 32.0-32.9, adult: Secondary | ICD-10-CM

## 2020-07-18 MED ORDER — WEGOVY 0.5 MG/0.5ML ~~LOC~~ SOAJ: 0.5000 mg | SUBCUTANEOUS | 0 refills | Status: DC

## 2020-07-18 NOTE — Progress Notes (Signed)
Chief Complaint:   OBESITY Angela Mcconnell is here to discuss her progress with her obesity treatment plan along with follow-up of her obesity related diagnoses. Angela Mcconnell is on the Category 2 Plan and states she is following her eating plan approximately 95% of the time. Angela Mcconnell states she is doing a 5K and on the treadmill for 30 minutes 6 times per week.  Today's visit was #: 4 Starting weight: 186 lbs Starting date: 06/01/2020 Today's weight: 180 lbs Today's date: 07/18/2020 Total lbs lost to date: 6 Total lbs lost since last in-office visit: 4  Interim History: Angela Mcconnell is on Wegovy 0.25 mg weekly and she denies nausea. She notes appetite suppression and satiety. She loves the Angela Mcconnell and feels it is working quite well for her. She is eating all of the food on the plan.  Subjective:   1. Insulin resistance Angela Mcconnell is on Wegovy. S Lab Results  Component Value Date   HGBA1C 5.4 06/01/2020  he denies polyphagia.  2. Vitamin D deficiency Angela Mcconnell is on OTC Vit D 2,000 IU daily. Last Vit D level was 39.6 at her last check, slightly low.  3. At risk for nausea Angela Mcconnell is at risk for nausea due to increased dose of Ozempic.  Assessment/Plan:   1. Insulin resistance Angela Mcconnell will continue her meal plan, and will continue to work on weight loss, exercise, and decreasing simple carbohydrates to help decrease the risk of diabetes. Angela Mcconnell agreed to follow-up with [place] as directed to closely monitor her progress.  2. Vitamin D deficiency Low Vitamin D level contributes to fatigue and are associated with obesity, breast, and colon cancer. Angela Mcconnell agreed to continue OTC Vitamin D and will follow-up for routine testing of Vitamin D, at least 2-3 times per year to avoid over-replacement.  3. At risk for nausea Angela Mcconnell was given approximately 15 minutes of nausea prevention counseling today. Angela Mcconnell is at risk for nausea due to her new or current medication. She was encouraged to titrate her  medication slowly, make sure to stay hydrated, eat smaller portions throughout the day, and avoid high fat meals.   4. Class 1 obesity with serious comorbidity and body mass index (BMI) of 32.0 to 32.9 in adult, unspecified obesity type Angela Mcconnell is currently in the action stage of change. As such, her goal is to continue with weight loss efforts. She has agreed to the Category 2 Plan.   We discussed various medication options to help Angela Mcconnell with her weight loss efforts and we both agreed to increase Wegovy to 0.5 mg weekly and we will refill for 1 month.  - Semaglutide-Weight Management (WEGOVY) 0.5 MG/0.5ML SOAJ; Inject 0.5 mg into the skin once a week.  Dispense: 2 mL; Refill: 0  Exercise goals: As is.  Behavioral modification strategies: planning for success.  Angela Mcconnell has agreed to follow-up with our clinic in 2 weeks. She was informed of the importance of frequent follow-up visits to maximize her success with intensive lifestyle modifications for her multiple health conditions.   Objective:   Blood pressure 125/83, pulse 77, temperature 98.1 F (36.7 C), temperature source Oral, height 5\' 3"  (1.6 m), weight 180 lb (81.6 kg), SpO2 100 %. Body mass index is 31.89 kg/m.  General: Cooperative, alert, well developed, in no acute distress. HEENT: Conjunctivae and lids unremarkable. Cardiovascular: Regular rhythm.  Lungs: Normal work of breathing. Neurologic: No focal deficits.   Lab Results  Component Value Date   CREATININE 0.85 06/01/2020   BUN 10 06/01/2020  NA 141 06/01/2020   K 4.2 06/01/2020   CL 103 06/01/2020   CO2 24 06/01/2020   Lab Results  Component Value Date   ALT 14 06/01/2020   AST 20 06/01/2020   ALKPHOS 104 06/01/2020   BILITOT 0.4 06/01/2020   Lab Results  Component Value Date   HGBA1C 5.4 06/01/2020   Lab Results  Component Value Date   INSULIN 10.8 06/01/2020   Lab Results  Component Value Date   TSH 1.070 06/01/2020   Lab Results    Component Value Date   CHOL 226 (H) 06/01/2020   HDL 59 06/01/2020   LDLCALC 147 (H) 06/01/2020   TRIG 111 06/01/2020   CHOLHDL 3.3 02/07/2020   Lab Results  Component Value Date   WBC 8.6 06/01/2020   HGB 12.8 06/01/2020   HCT 40.1 06/01/2020   MCV 94 06/01/2020   PLT 319 06/01/2020   Lab Results  Component Value Date   IRON 95 06/01/2020   TIBC 315 06/01/2020   FERRITIN 228 (H) 06/01/2020   Attestation Statements:   Reviewed by clinician on day of visit: allergies, medications, problem list, medical history, surgical history, family history, social history, and previous encounter notes.   Wilhemena Durie, am acting as Location manager for [employer], FNP-C.  I have reviewed the above documentation for accuracy and completeness, and I agree with the above. -  Georgianne Fick, FNP

## 2020-08-01 ENCOUNTER — Other Ambulatory Visit: Payer: Self-pay

## 2020-08-01 ENCOUNTER — Telehealth: Payer: Self-pay | Admitting: Family Medicine

## 2020-08-01 ENCOUNTER — Other Ambulatory Visit: Payer: No Typology Code available for payment source

## 2020-08-01 ENCOUNTER — Emergency Department (HOSPITAL_COMMUNITY)
Admission: EM | Admit: 2020-08-01 | Discharge: 2020-08-01 | Disposition: A | Discharge status: Home / Self Care | Payer: No Typology Code available for payment source | Attending: Emergency Medicine | Admitting: Emergency Medicine

## 2020-08-01 ENCOUNTER — Encounter (HOSPITAL_COMMUNITY): Payer: Self-pay

## 2020-08-01 ENCOUNTER — Emergency Department (HOSPITAL_COMMUNITY): Payer: No Typology Code available for payment source

## 2020-08-01 ENCOUNTER — Ambulatory Visit (INDEPENDENT_AMBULATORY_CARE_PROVIDER_SITE_OTHER): Payer: No Typology Code available for payment source | Admitting: Family Medicine

## 2020-08-01 DIAGNOSIS — U071 COVID-19: Secondary | ICD-10-CM

## 2020-08-01 DIAGNOSIS — R0602 Shortness of breath: Secondary | ICD-10-CM | POA: Diagnosis present

## 2020-08-01 DIAGNOSIS — J4520 Mild intermittent asthma, uncomplicated: Secondary | ICD-10-CM | POA: Insufficient documentation

## 2020-08-01 DIAGNOSIS — Z79899 Other long term (current) drug therapy: Secondary | ICD-10-CM | POA: Insufficient documentation

## 2020-08-01 DIAGNOSIS — Z20822 Contact with and (suspected) exposure to covid-19: Secondary | ICD-10-CM

## 2020-08-01 LAB — SARS CORONAVIRUS 2 BY RT PCR (HOSPITAL ORDER, PERFORMED IN ~~LOC~~ HOSPITAL LAB): SARS Coronavirus 2: POSITIVE — AB

## 2020-08-01 MED ORDER — METHYLPREDNISOLONE SODIUM SUCC 125 MG IJ SOLR: 125.0000 mg | Freq: Once | INTRAMUSCULAR | Status: DC | PRN

## 2020-08-01 MED ORDER — DIPHENHYDRAMINE HCL 50 MG/ML IJ SOLN: 50.0000 mg | Freq: Once | INTRAMUSCULAR | Status: DC | PRN

## 2020-08-01 MED ORDER — SODIUM CHLORIDE 0.9 % IV SOLN: INTRAVENOUS | Status: DC | PRN

## 2020-08-01 MED ORDER — BENZONATATE 100 MG PO CAPS: 100.0000 mg | ORAL_CAPSULE | Freq: Three times a day (TID) | ORAL | 0 refills | Status: DC

## 2020-08-01 MED ORDER — ALBUTEROL SULFATE HFA 108 (90 BASE) MCG/ACT IN AERS: 2.0000 | INHALATION_SPRAY | Freq: Once | RESPIRATORY_TRACT | Status: DC | PRN

## 2020-08-01 MED ORDER — EPINEPHRINE 0.3 MG/0.3ML IJ SOAJ: 0.3000 mg | Freq: Once | INTRAMUSCULAR | Status: DC | PRN

## 2020-08-01 MED ORDER — SODIUM CHLORIDE 0.9 % IV SOLN
1200.0000 mg | Freq: Once | INTRAVENOUS | Status: AC
  Administered 2020-08-01: 1200 mg via INTRAVENOUS
  Filled 2020-08-01 (×2): qty 10

## 2020-08-01 MED ORDER — FAMOTIDINE IN NACL 20-0.9 MG/50ML-% IV SOLN: 20.0000 mg | Freq: Once | INTRAVENOUS | Status: DC | PRN

## 2020-08-01 NOTE — Telephone Encounter (Signed)
Noted  

## 2020-08-01 NOTE — Discharge Instructions (Addendum)
You have tested POSITIVE for COVID 19 today. We provided monoclonal antibodies in the ED. You will need to self isolate for 14 days from symptom onset (cleared: 09/12).  Use your albuterol inhaler as needed. I have provided cough medication for you.  Drink plenty of fluids to stay hydrated  I would recommend buying a pulse oximeter (can be found on [address]) to monitor your oxygen saturations. If the reading is persistent < 88% with you sitting still you will need to come back to the ED IMMEDIATELY Follow up with your PCP regarding your ED visit today Return to the ED IMMEDIATELY for any worsening symptoms including worsening shortness of breath, severe chest pain, passing out.

## 2020-08-01 NOTE — Telephone Encounter (Signed)
Per chart, pt went to ED. Presently there for testing/eval.

## 2020-08-01 NOTE — ED Notes (Signed)
Reviewed discharge instructions with patient. Follow-up care and medications reviewed. Patient  verbalized understanding. Patient A&Ox4, VSS, and ambulatory with steady gait upon discharge.  °

## 2020-08-01 NOTE — ED Provider Notes (Signed)
[place] EMERGENCY DEPARTMENT Provider Note   CSN: [id number] Arrival date & time: 08/01/20  8295     History Chief Complaint  Patient presents with  . covid test/ asthma    Angela Mcconnell is a 59 y.o. female with PMHx asthma who presents to the ED today with complaint of worsening shortness of breath and dry cough x 3 days. Pt reports she gave a ride to an individual at church last Sunday 08/22. She did not think anything of it until she woke up with a scratchy throat 3 days ago. She attributed it to sleeping with the [facility] on and went about her day however the next day she began feeling short of breath and fatigued. Pt called the individual she gave a ride to and he let her know he now has COVID. Pt is fully vaccinated; second dose of pfizer received on 04/01. Pt has been using her albuterol inhaler without relief prompting her to come to the ED today.   The history is provided by the patient and medical records.       Past Medical History:  Diagnosis Date  . Anemia   . Arthritis   . Asthma   . Chest pain   . Depression   . GERD (gastroesophageal reflux disease)   . Insomnia   . Joint pain   . Multiple food allergies   . Osteoarthritis   . Palpitations   . Shortness of breath   . Vitamin D deficiency     Patient Active Problem List   Diagnosis Date Noted  . Vitamin D deficiency 07/18/2020  . Insulin resistance 07/03/2020  . Allergic urticaria 01/11/2019  . Food allergy 01/11/2019  . Seasonal allergic rhinitis 01/11/2019  . Allergic conjunctivitis 01/11/2019  . Mild intermittent asthma 01/11/2019  . Hiatal hernia with GERD and esophagitis s/p robotic repair & Nissen fundoplication 62/13/0865 78/46/9629  . Status post Nissen fundoplication 52/84/1324 40/09/2724  . Class 1 obesity with serious comorbidity and body mass index (BMI) of 32.0 to 32.9 in adult 12/05/2016  . Elevated LDL cholesterol level 12/05/2016  . RBBB 07/16/2013  . Normal  coronary arteries- Nov 2010 07/16/2013  . Dyspnea on exertion 07/16/2013  . Anemia 07/16/2013  . Chest pain at rest 07/15/2013  . PND (paroxysmal nocturnal dyspnea) 07/15/2013  . Asthma     Past Surgical History:  Procedure Laterality Date  . ABDOMINAL HYSTERECTOMY    . CORONARY ANGIOGRAM  Nov 2010   Normal coronary arteries  . ESOPHAGEAL MANOMETRY N/A 08/12/2018   Procedure: ESOPHAGEAL MANOMETRY (EM);  Surgeon: Clarene Essex, MD;  Location: [facility];  Service: Endoscopy;  Laterality: N/A;  . HERNIA REPAIR    . INSERTION OF MESH N/A 10/14/2018   Procedure: INSERTION OF MESH;  Surgeon: Michael Boston, MD;  Location: [facility];  Service: General;  Laterality: N/A;  . KNEE SURGERY       OB History    Gravida  2   Para  2   Term  0   Preterm  0   AB  0   Living  0     SAB  0   TAB  0   Ectopic  0   Multiple  0   Live Births  2           Family History  Problem Relation Age of Onset  . Cancer Sister   . Hypertension Mother   . Heart disease Mother   . Stroke Mother   .  Obesity Mother   . Cancer Father     Social History   Tobacco Use  . Smoking status: Never Smoker  . Smokeless tobacco: Never Used  Vaping Use  . Vaping Use: Never used  Substance Use Topics  . Alcohol use: No  . Drug use: No    Home Medications Prior to Admission medications   Medication Sig Start Date End Date Taking? Authorizing Provider  APPLE CIDER VINEGAR PO Take 2 each by mouth daily.    [provider]  benzonatate (TESSALON) 100 MG capsule Take 1 capsule (100 mg total) by mouth every 8 (eight) hours. 08/01/20   Eustaquio Maize, PA-C  Cholecalciferol (VITAMIN D3) 50 MCG (2000 UT) TABS Take by mouth daily.     [provider]  EPINEPHrine (AUVI-Q) 0.3 mg/0.3 mL IJ SOAJ injection Inject 0.3 mLs (0.3 mg total) into the muscle as needed for anaphylaxis. 05/10/20   Charlott Rakes, MD  hydrOXYzine (ATARAX/VISTARIL) 25 MG tablet Take 1 tablet (25 mg total) by mouth  at bedtime as needed. 05/10/20   Charlott Rakes, MD  levocetirizine (XYZAL) 5 MG tablet Take 1 tablet (5 mg total) by mouth every evening. 05/10/20   Charlott Rakes, MD  Multiple Vitamin (MULTIVITAMIN) tablet Take 2 tablets by mouth daily.    [provider]  Multiple Vitamins-Minerals (AIRBORNE PO) Take by mouth.    [provider]  OVER THE COUNTER MEDICATION Goli Vitamin    [provider]  pantoprazole (PROTONIX) 40 MG tablet Take 1 tablet (40 mg total) by mouth daily. 05/10/20   Charlott Rakes, MD  Semaglutide-Weight Management (WEGOVY) 0.5 MG/0.5ML SOAJ Inject 0.5 mg into the skin once a week. 07/18/20   Whitmire, Dawn W, FNP  UNABLE TO FIND 6,000 mg as needed. Med Name: Peppermint CBD oil    [provider]    Allergies    Sulfa antibiotics, Tape, Clotrimazole, Eggs or egg-derived products, Hydrocodone, and Shellfish allergy  Review of Systems   Review of Systems  Constitutional: Positive for chills and fatigue. Negative for fever.  HENT: Positive for sore throat.   Respiratory: Positive for cough and shortness of breath.   Cardiovascular: Negative for palpitations.  Gastrointestinal: Negative for abdominal pain, diarrhea, nausea and vomiting.  All other systems reviewed and are negative.   Physical Exam Updated Vital Signs BP 129/83   Pulse 89   Temp 98.7 F (37.1 C) (Oral)   Resp 20   Ht 5\' 3"  (1.6 m)   Wt 81.6 kg   SpO2 99%   BMI 31.89 kg/m   Physical Exam Vitals and nursing note reviewed.  Constitutional:      Appearance: She is ill-appearing. She is not diaphoretic.     Comments: Appears fatigued; actively coughing in the room  HENT:     Head: Normocephalic and atraumatic.  Eyes:     Conjunctiva/sclera: Conjunctivae normal.  Cardiovascular:     Rate and Rhythm: Normal rate and regular rhythm.     Pulses: Normal pulses.  Pulmonary:     Effort: Pulmonary effort is normal.     Breath sounds: Decreased breath sounds present. No  wheezing, rhonchi or rales.  Abdominal:     Palpations: Abdomen is soft.     Tenderness: There is no abdominal tenderness. There is no guarding or rebound.  Musculoskeletal:     Cervical back: Neck supple.  Skin:    General: Skin is warm and dry.  Neurological:     Mental Status: She is alert.  ED Results / Procedures / Treatments   Labs (all labs ordered are listed, but only abnormal results are displayed) Labs Reviewed  SARS CORONAVIRUS 2 BY RT PCR (HOSPITAL ORDER, [facility]) - Abnormal; Notable for the following components:      Result Value   SARS Coronavirus 2 POSITIVE (*)    All other components within normal limits    EKG None  Radiology DG Chest Port 1 View  Result Date: 08/01/2020 CLINICAL DATA:  Shortness of breath EXAM: PORTABLE CHEST 1 VIEW COMPARISON:  07/10/2015 chest radiograph and prior. FINDINGS: The heart size and mediastinal contours are within normal limits. Both lungs are clear. No pneumothorax or pleural effusion. No acute osseous abnormality. IMPRESSION: No focal airspace disease. Electronically Signed   By: Primitivo Gauze M.D.   On: 08/01/2020 14:53    Procedures Procedures (including critical care time)  Medications Ordered in ED Medications  0.9 %  sodium chloride infusion (has no administration in time range)  diphenhydrAMINE (BENADRYL) injection 50 mg (has no administration in time range)  famotidine (PEPCID) IVPB 20 mg premix (has no administration in time range)  methylPREDNISolone sodium succinate (SOLU-MEDROL) 125 mg/2 mL injection 125 mg (has no administration in time range)  albuterol (VENTOLIN HFA) 108 (90 Base) MCG/ACT inhaler 2 puff (has no administration in time range)  EPINEPHrine (EPI-PEN) injection 0.3 mg (has no administration in time range)  casirivimab-imdevimab (REGEN-COV) 1,200 mg in sodium chloride 0.9 % 110 mL IVPB (1,200 mg Intravenous New Bag/Given 08/01/20 1629)    ED Course  I have  reviewed the triage vital signs and the nursing notes.  Pertinent labs & imaging results that were available during my care of the patient were reviewed by me and considered in my medical decision making (see chart for details).    MDM Rules/Calculators/A&P                         Angela Mcconnell was evaluated in Emergency Department on 08/01/2020 for the symptoms described in the history of present illness. She was evaluated in the context of the global COVID-19 pandemic, which necessitated consideration that the patient might be at risk for infection with the SARS-CoV-2 virus that causes COVID-19. Institutional protocols and algorithms that pertain to the evaluation of patients at risk for COVID-19 are in a state of rapid change based on information released by regulatory bodies including the CDC and federal and state organizations. These policies and algorithms were followed during the patient's care in the ED.  59 year old female with a history of asthma who presents to the ED today with Covid-like symptoms including fatigue, chills, cough, shortness of breath for the past 3 days.  Recently around an individual who is also tested positive for COVID-19.  Patient is fully vaccinated.  On arrival to the ED patient is afebrile, nontachycardic and nontachypneic.  She appears to be in no acute distress but is ill-appearing.  She is actively coughing in the room.  She is able to speak in full sentences however.  COVID test was ordered while patient was in the waiting room, positive at this time.  Will obtain chest x-ray given cough and history of asthma.  Patient has decreased breath sounds throughout however no wheezing appreciated.  Suspect shortness of breath is likely related to Covid infection and not asthma exacerbation.  Have discussed monoclonal antibodies with patient, she is interested in.  She does meet criteria at this time  given positive test to the ED, BMI greater than 25, history of asthma.  Will order in the ED for patient today.   Pt has received MAB and been monitored for the appropriate amount of time. She is instructed to self isolate for 14 days and to follow up with PCP. Strict return precautions discussed. She is in agreement with plan and stable for discharge home.   This note was prepared using Dragon voice recognition software and may include unintentional dictation errors due to the inherent limitations of voice recognition software.   Final Clinical Impression(s) / ED Diagnoses Final diagnoses:  [id number]    Rx / DC Orders ED Discharge Orders         Ordered    benzonatate (TESSALON) 100 MG capsule  Every 8 hours        08/01/20 1710           Discharge Instructions     You have tested POSITIVE for COVID 19 today. We provided monoclonal antibodies in the ED. You will need to self isolate for 14 days from symptom onset (cleared: 09/12).  Use your albuterol inhaler as needed. I have provided cough medication for you.  Drink plenty of fluids to stay hydrated  I would recommend buying a pulse oximeter (can be found on [place]) to monitor your oxygen saturations. If the reading is persistent < 88% with you sitting still you will need to come back to the ED IMMEDIATELY Follow up with your PCP regarding your ED visit today Return to the ED IMMEDIATELY for any worsening symptoms including worsening shortness of breath, severe chest pain, passing out.        Eustaquio Maize, PA-C 08/01/20 1711    Daleen Bo, MD 08/01/20 2012

## 2020-08-01 NOTE — Telephone Encounter (Unsigned)
Copied from [place] [id number]. Topic: General - Other >> Aug 01, 2020  9:23 AM Yvette Rack wrote: Reason for CRM: Pt stated she has been vaccinated but was exposed to someone who is positive for covid.Pt stated now she can not taste or smell anything and she would like to speak with someone regarding treatment. Pt requests call back

## 2020-08-01 NOTE — ED Triage Notes (Signed)
Patient states that she was exposed to covid and has asthma and thinks her cough is worse. Has been vaccinated, NAD

## 2020-08-01 NOTE — ED Notes (Signed)
Pt ambulated to room w/o any distress noted

## 2020-08-01 NOTE — ED Notes (Signed)
2 unsuccessful IV attempts.

## 2020-08-01 NOTE — Telephone Encounter (Signed)
Attempted to reach patient, busy signal; number verified and redialed, busy.

## 2020-08-02 LAB — NOVEL CORONAVIRUS, NAA: SARS-CoV-2, NAA: DETECTED — AB

## 2020-08-04 ENCOUNTER — Other Ambulatory Visit: Payer: Self-pay

## 2020-08-16 ENCOUNTER — Other Ambulatory Visit: Payer: Self-pay

## 2020-08-16 ENCOUNTER — Encounter (INDEPENDENT_AMBULATORY_CARE_PROVIDER_SITE_OTHER): Payer: Self-pay | Admitting: Family Medicine

## 2020-08-16 ENCOUNTER — Ambulatory Visit (INDEPENDENT_AMBULATORY_CARE_PROVIDER_SITE_OTHER): Payer: No Typology Code available for payment source | Admitting: Family Medicine

## 2020-08-16 VITALS — BP 119/79 | HR 81 | Temp 98.4°F | Ht 63.0 in | Wt 176.0 lb

## 2020-08-16 DIAGNOSIS — Z9189 Other specified personal risk factors, not elsewhere classified: Secondary | ICD-10-CM

## 2020-08-16 DIAGNOSIS — E559 Vitamin D deficiency, unspecified: Secondary | ICD-10-CM

## 2020-08-16 DIAGNOSIS — R632 Polyphagia: Secondary | ICD-10-CM | POA: Diagnosis not present

## 2020-08-16 DIAGNOSIS — E669 Obesity, unspecified: Secondary | ICD-10-CM | POA: Diagnosis not present

## 2020-08-16 DIAGNOSIS — Z6834 Body mass index (BMI) 34.0-34.9, adult: Secondary | ICD-10-CM

## 2020-08-16 DIAGNOSIS — E8881 Metabolic syndrome: Secondary | ICD-10-CM | POA: Diagnosis not present

## 2020-08-16 MED ORDER — WEGOVY 1 MG/0.5ML ~~LOC~~ SOAJ: 1.0000 mg | SUBCUTANEOUS | 0 refills | Status: DC

## 2020-08-16 MED ORDER — WEGOVY 1.7 MG/0.75ML ~~LOC~~ SOAJ: 1.7000 mg | SUBCUTANEOUS | 0 refills | Status: DC

## 2020-08-16 MED ORDER — WEGOVY 0.5 MG/0.5ML ~~LOC~~ SOAJ: 0.5000 mg | SUBCUTANEOUS | 0 refills | Status: DC

## 2020-08-20 NOTE — Progress Notes (Signed)
Chief Complaint:   OBESITY Angela Mcconnell is here to discuss her progress with her obesity treatment plan along with follow-up of her obesity related diagnoses. Angela Mcconnell is on the Category 2 Plan and states she is following her eating plan approximately 90% of the time. Angela Mcconnell states she is walking for 60 minutes 4 times per week.  Today's visit was #: 5 Starting weight: 186 lbs Starting date: 06/01/2020 Today's weight: 176 lbs Today's date: 08/16/2020 Total lbs lost to date: 10 lbs Total lbs lost since last in-office visit: 4 lbs Total weight loss percentage to date: -5.38%  Interim History: Angela Mcconnell is on her second week of Wegovy.  She is tolerating it well.  Today's bioimpedance results indicate that Angela Mcconnell has gained 2.2 pounds of water weight since her last visit.  Assessment/Plan:   1. Vitamin D deficiency Current vitamin D is 39.6, tested on 06/01/2020. Not at goal. Optimal goal > 50 ng/dL. There is also evidence to support a goal of >70 ng/dL in patients with cancer and heart disease. Plan: Continue Vitamin D @50 ,000 IU every week with follow-up for routine testing of Vitamin D at least 2-3 times per year to avoid over-replacement.  2. Insulin resistance Angela Mcconnell will continue to work on weight loss, exercise, and decreasing simple carbohydrates to help decrease the risk of diabetes. Angela Mcconnell agreed to follow-up with [place] as directed to closely monitor her progress.  Lab Results  Component Value Date   INSULIN 10.8 06/01/2020   Lab Results  Component Value Date   HGBA1C 5.4 06/01/2020   3. Polyphagia The current medical regimen is effective;  continue present plan and Wegovy.  4. At risk for constipation Angela Mcconnell was informed that a decrease in bowel movement frequency is normal while losing weight and increasing Wegovy, but stools should not be hard or painful.   Counseling Getting to Good Bowel Health: Your goal is to have one soft bowel movement each day. Drink at least 8  glasses of water each day. Eat plenty of fiber (goal is over 25 grams each day). It is best to get most of your fiber from dietary sources which includes leafy green vegetables, fresh fruit, and whole grains. You may need to add fiber with the help of OTC fiber supplements. These include Metamucil, Citrucel, and Flaxseed. If you are still having trouble, try adding Miralax or Magnesium Citrate. If all of these changes do not work, Cabin crew.  5. Class 1 obesity with serious comorbidity and body mass index (BMI) of 34.0 to 34.9 in adult, unspecified obesity type I will go ahead and prescribe the advanced doses of the regimen in order to avoid delay in future prescriptions since there is a supply/demand issue with this medication currently.   - Semaglutide-Weight Management (WEGOVY) 0.5 MG/0.5ML SOAJ; Inject 0.5 mg into the skin once a week.  Dispense: 2 mL; Refill: 0 - Semaglutide-Weight Management (WEGOVY) 1 MG/0.5ML SOAJ; Inject 1 mg into the skin once a week.  Dispense: 2 mL; Refill: 0 - Semaglutide-Weight Management (WEGOVY) 1.7 MG/0.75ML SOAJ; Inject 1.7 mg into the skin once a week.  Dispense: 3 mL; Refill: 0  Angela Mcconnell is currently in the action stage of change. As such, her goal is to continue with weight loss efforts. She has agreed to the Category 2 Plan.   Exercise goals: For substantial health benefits, adults should do at least 150 minutes (2 hours and 30 minutes) a week of moderate-intensity, or 75 minutes (1 hour and 15 minutes)  a week of vigorous-intensity aerobic physical activity, or an equivalent combination of moderate- and vigorous-intensity aerobic activity. Aerobic activity should be performed in episodes of at least 10 minutes, and preferably, it should be spread throughout the week.  Behavioral modification strategies: increasing lean protein intake.  Angela Mcconnell has agreed to follow-up with our clinic in 2-3 weeks. She was informed of the importance of frequent  follow-up visits to maximize her success with intensive lifestyle modifications for her multiple health conditions.   Objective:   Blood pressure 119/79, pulse 81, temperature 98.4 F (36.9 C), temperature source Oral, height 5\' 3"  (1.6 m), weight 176 lb (79.8 kg), SpO2 99 %. Body mass index is 31.18 kg/m.  General: Cooperative, alert, well developed, in no acute distress. HEENT: Conjunctivae and lids unremarkable. Cardiovascular: Regular rhythm.  Lungs: Normal work of breathing. Neurologic: No focal deficits.   Lab Results  Component Value Date   CREATININE 0.85 06/01/2020   BUN 10 06/01/2020   NA 141 06/01/2020   K 4.2 06/01/2020   CL 103 06/01/2020   CO2 24 06/01/2020   Lab Results  Component Value Date   ALT 14 06/01/2020   AST 20 06/01/2020   ALKPHOS 104 06/01/2020   BILITOT 0.4 06/01/2020   Lab Results  Component Value Date   HGBA1C 5.4 06/01/2020   Lab Results  Component Value Date   INSULIN 10.8 06/01/2020   Lab Results  Component Value Date   TSH 1.070 06/01/2020   Lab Results  Component Value Date   CHOL 226 (H) 06/01/2020   HDL 59 06/01/2020   LDLCALC 147 (H) 06/01/2020   TRIG 111 06/01/2020   CHOLHDL 3.3 02/07/2020   Lab Results  Component Value Date   WBC 8.6 06/01/2020   HGB 12.8 06/01/2020   HCT 40.1 06/01/2020   MCV 94 06/01/2020   PLT 319 06/01/2020   Lab Results  Component Value Date   IRON 95 06/01/2020   TIBC 315 06/01/2020   FERRITIN 228 (H) 06/01/2020   Attestation Statements:   Reviewed by clinician on day of visit: allergies, medications, problem list, medical history, surgical history, family history, social history, and previous encounter notes.  I, Water quality scientist, CMA, am acting as transcriptionist for Briscoe Deutscher, DO  I have reviewed the above documentation for accuracy and completeness, and I agree with the above. Briscoe Deutscher, DO

## 2020-08-23 ENCOUNTER — Ambulatory Visit: Payer: No Typology Code available for payment source | Admitting: Obstetrics and Gynecology

## 2020-08-23 ENCOUNTER — Other Ambulatory Visit: Payer: Self-pay

## 2020-08-23 ENCOUNTER — Encounter: Payer: Self-pay | Admitting: Obstetrics and Gynecology

## 2020-08-23 VITALS — BP 136/78 | HR 84 | Resp 14 | Ht 63.5 in | Wt 177.6 lb

## 2020-08-23 DIAGNOSIS — Z01419 Encounter for gynecological examination (general) (routine) without abnormal findings: Secondary | ICD-10-CM | POA: Diagnosis not present

## 2020-08-23 NOTE — Patient Instructions (Signed)

## 2020-08-23 NOTE — Progress Notes (Signed)
59 y.o. G2P0000 Significant Other African American female here for annual exam.    Seeing [facility].  Doing Wegovy injection for weight loss.  Lost 14 pounds.   Declines STD testing.   Had COVID 08-01-20.  Has 6 grandchildren and expecting a 46th in March.  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  PCP:  Charlott Rakes, MD   No LMP recorded. Patient has had a hysterectomy.           Sexually active: No.  The current method of family planning is status post hysterectomy.    Exercising: Yes.    treadmill daily Smoker:  no  Health Maintenance: Pap: 2010 normal at time of Hyst History of abnormal Pap:  no MMG: 05-08-20  3D/Neg/density B/BiRads1 Colonoscopy: age 20 normal;10 years BMD:   n/a  Result  n/a TDaP: Unsure--probably due Gardasil:   no HIV: 02-07-20 Neg Hep C:02-07-20 Hep C Neg Screening Labs:  [facility]    reports that she has never smoked. She has never used smokeless tobacco. She reports that she does not drink alcohol and does not use drugs.  Past Medical History:  Diagnosis Date  . Anemia   . Arthritis   . Asthma   . Blood transfusion without reported diagnosis 11/2018  . Chest pain   . Depression   . Fibroid   . GERD (gastroesophageal reflux disease)   . Insomnia   . Joint pain   . Multiple food allergies   . Osteoarthritis   . Palpitations   . Shortness of breath   . Vitamin D deficiency     Past Surgical History:  Procedure Laterality Date  . ABDOMINAL HYSTERECTOMY     [facility]  . CORONARY ANGIOGRAM  Nov 2010   Normal coronary arteries  . ESOPHAGEAL MANOMETRY N/A 08/12/2018   Procedure: ESOPHAGEAL MANOMETRY (EM);  Surgeon: Clarene Essex, MD;  Location: [facility];  Service: Endoscopy;  Laterality: N/A;  . HERNIA REPAIR    . INSERTION OF MESH N/A 10/14/2018   Procedure: INSERTION OF MESH;  Surgeon: Michael Boston, MD;  Location: [facility];  Service: General;  Laterality: N/A;  . KNEE SURGERY    . OOPHORECTOMY       Current Outpatient Medications  Medication Sig Dispense Refill  . APPLE CIDER VINEGAR PO Take 2 each by mouth daily.    . Cholecalciferol (VITAMIN D3) 50 MCG (2000 UT) TABS Take by mouth daily.     Marland Kitchen EPINEPHrine (AUVI-Q) 0.3 mg/0.3 mL IJ SOAJ injection Inject 0.3 mLs (0.3 mg total) into the muscle as needed for anaphylaxis. 1 each 2  . hydrOXYzine (ATARAX/VISTARIL) 25 MG tablet Take 1 tablet (25 mg total) by mouth at bedtime as needed. 90 tablet 1  . Multiple Vitamin (MULTIVITAMIN) tablet Take 2 tablets by mouth daily.    . Multiple Vitamins-Minerals (AIRBORNE PO) Take by mouth.    Marland Kitchen OVER THE COUNTER MEDICATION [place] Vitamin    . pantoprazole (PROTONIX) 40 MG tablet Take 1 tablet (40 mg total) by mouth daily. 30 tablet 1  . Semaglutide-Weight Management (WEGOVY) 0.5 MG/0.5ML SOAJ Inject 0.5 mg into the skin once a week. 2 mL 0  . Semaglutide-Weight Management (WEGOVY) 1 MG/0.5ML SOAJ Inject 1 mg into the skin once a week. 2 mL 0  . Semaglutide-Weight Management (WEGOVY) 1.7 MG/0.75ML SOAJ Inject 1.7 mg into the skin once a week. 3 mL 0   No current facility-administered medications for this visit.    Family  History  Problem Relation Age of Onset  . Cancer Sister   . Thyroid disease Sister        Thyroidectomy  . Hypertension Mother   . Heart disease Mother   . Stroke Mother   . Obesity Mother   . Cancer Father   . Hypertension Father   . Heart attack Brother 23  . Ovarian cancer Sister 37    Review of Systems  All other systems reviewed and are negative.   Exam:   BP 136/78   Pulse 84   Resp 14   Ht 5' 3.5" (1.613 m)   Wt 177 lb 9.6 oz (80.6 kg)   BMI 30.97 kg/m     General appearance: alert, cooperative and appears stated age Head: normocephalic, without obvious abnormality, atraumatic Neck: no adenopathy, supple, symmetrical, trachea midline and thyroid normal to inspection and palpation Lungs: clear to auscultation bilaterally Breasts: normal appearance, no  masses or tenderness, No nipple retraction or dimpling, No nipple discharge or bleeding, No axillary adenopathy Heart: regular rate and rhythm Abdomen: soft, non-tender; no masses, no organomegaly Extremities: extremities normal, atraumatic, no cyanosis or edema Skin: skin color, texture, turgor normal. No rashes or lesions Lymph nodes: cervical, supraclavicular, and axillary nodes normal. Neurologic: grossly normal  Pelvic: External genitalia:  no lesions              No abnormal inguinal nodes palpated.              Urethra:  normal appearing urethra with no masses, tenderness or lesions              Bartholins and Skenes: normal                 Vagina: normal appearing vagina with normal color and discharge, no lesions              Cervix:  absent              Pap taken: No. Bimanual Exam:  Uterus:  absent              Adnexa: no mass, fullness, tenderness              Rectal exam: Yes.  .  Confirms.              Anus:  normal sphincter tone, no lesions  Chaperone was present for exam.  Assessment:   Well woman visit with normal exam. Status post hysterectomy.  Status post laparoscopic BSO. and LOA.  Plan: Mammogram screening discussed. Self breast awareness reviewed. Pap and HR HPV as above. Guidelines for Calcium, Vitamin D, regular exercise program including cardiovascular and weight bearing exercise. Return for TDap if desired.  We do not have this supply today. Follow up annually and prn.  After visit summary provided.

## 2020-08-25 ENCOUNTER — Ambulatory Visit: Payer: No Typology Code available for payment source

## 2020-09-07 ENCOUNTER — Ambulatory Visit (INDEPENDENT_AMBULATORY_CARE_PROVIDER_SITE_OTHER): Payer: No Typology Code available for payment source | Admitting: Family Medicine

## 2020-09-07 ENCOUNTER — Encounter (INDEPENDENT_AMBULATORY_CARE_PROVIDER_SITE_OTHER): Payer: Self-pay | Admitting: Family Medicine

## 2020-09-07 VITALS — BP 101/69 | HR 92 | Temp 98.3°F | Ht 63.0 in | Wt 170.0 lb

## 2020-09-07 DIAGNOSIS — R632 Polyphagia: Secondary | ICD-10-CM | POA: Diagnosis not present

## 2020-09-07 DIAGNOSIS — K219 Gastro-esophageal reflux disease without esophagitis: Secondary | ICD-10-CM

## 2020-09-07 DIAGNOSIS — E669 Obesity, unspecified: Secondary | ICD-10-CM

## 2020-09-07 DIAGNOSIS — Z6830 Body mass index (BMI) 30.0-30.9, adult: Secondary | ICD-10-CM

## 2020-09-07 DIAGNOSIS — E559 Vitamin D deficiency, unspecified: Secondary | ICD-10-CM | POA: Diagnosis not present

## 2020-09-11 NOTE — Progress Notes (Signed)
Chief Complaint:   OBESITY Dorathea is here to discuss her progress with her obesity treatment plan along with follow-up of her obesity related diagnoses. Christena is on the Category 2 Plan and states she is following her eating plan approximately 100% of the time. Aayat states she is walking for 30-60 minutes 7 times per week.  Today's visit was #: 6 Starting weight: 186 lbs Starting date: 06/01/2020 Today's weight: 170 lbs Today's date: 09/07/2020 Total lbs lost to date: 16 lbs Total lbs lost since last in-office visit: 6 lbs Total weight loss percentage to date: -8.60%  Interim History: Ruthella says she has been eating out and eating late for 1 week.  She says she had wings/fries, soul food, collard greens, and cornbread.  She is still on Wegovy 0.5 mg and doing well.  She is also ready to get back to her meal plan and regular exercise.  Assessment/Plan:   1. Polyphagia Improving. Hyperphagia, also called polyphagia, refers to excessive feelings of hunger, which are not relieved by eating. This is more likely to be an issues for people that have diabetes, prediabetes, or insulin resistance. She will continue to focus on protein-rich, low simple carbohydrate foods. We reviewed the importance of hydration, regular exercise for stress reduction, and restorative sleep. She will continue Wegovy.  2. Vitamin D deficiency Current vitamin D is 39.6, tested on 06/01/2020. Not at goal. Optimal goal > 50 ng/dL. There is also evidence to support a goal of >70 ng/dL in patients with cancer and heart disease.   Plan: Continue Vitamin D @2 ,000 IU daily with follow-up for routine testing of Vitamin D at least 2-3 times per year to avoid over-replacement.  3. Gastroesophageal reflux disease She is taking Protonix 40 mg daily for GERD symptoms. The current medical regimen is effective;  continue present plan and medications. Will continue to monitor symptoms as they relate to her weight loss  journey.  4. Class 1 obesity with serious comorbidity and body mass index (BMI) of 30.0 to 30.9 in adult, unspecified obesity type  Cattie is currently in the action stage of change. As such, her goal is to continue with weight loss efforts. She has agreed to keeping a food journal and adhering to recommended goals of 1200 calories and 95 grams of protein.   Exercise goals: For substantial health benefits, adults should do at least 150 minutes (2 hours and 30 minutes) a week of moderate-intensity, or 75 minutes (1 hour and 15 minutes) a week of vigorous-intensity aerobic physical activity, or an equivalent combination of moderate- and vigorous-intensity aerobic activity. Aerobic activity should be performed in episodes of at least 10 minutes, and preferably, it should be spread throughout the week.  Behavioral modification strategies: increasing lean protein intake, decreasing simple carbohydrates, increasing vegetables and increasing water intake.  Aaryn has agreed to follow-up with our clinic in 2-3 weeks. She was informed of the importance of frequent follow-up visits to maximize her success with intensive lifestyle modifications for her multiple health conditions.   Objective:   Blood pressure 101/69, pulse 92, temperature 98.3 F (36.8 C), temperature source Oral, height 5\' 3"  (1.6 m), weight 170 lb (77.1 kg), SpO2 99 %. Body mass index is 30.11 kg/m.  General: Cooperative, alert, well developed, in no acute distress. HEENT: Conjunctivae and lids unremarkable. Cardiovascular: Regular rhythm.  Lungs: Normal work of breathing. Neurologic: No focal deficits.   Lab Results  Component Value Date   CREATININE 0.85 06/01/2020   BUN 10  06/01/2020   NA 141 06/01/2020   K 4.2 06/01/2020   CL 103 06/01/2020   CO2 24 06/01/2020   Lab Results  Component Value Date   ALT 14 06/01/2020   AST 20 06/01/2020   ALKPHOS 104 06/01/2020   BILITOT 0.4 06/01/2020   Lab Results  Component  Value Date   HGBA1C 5.4 06/01/2020   Lab Results  Component Value Date   INSULIN 10.8 06/01/2020   Lab Results  Component Value Date   TSH 1.070 06/01/2020   Lab Results  Component Value Date   CHOL 226 (H) 06/01/2020   HDL 59 06/01/2020   LDLCALC 147 (H) 06/01/2020   TRIG 111 06/01/2020   CHOLHDL 3.3 02/07/2020   Lab Results  Component Value Date   WBC 8.6 06/01/2020   HGB 12.8 06/01/2020   HCT 40.1 06/01/2020   MCV 94 06/01/2020   PLT 319 06/01/2020   Lab Results  Component Value Date   IRON 95 06/01/2020   TIBC 315 06/01/2020   FERRITIN 228 (H) 06/01/2020   Attestation Statements:   Reviewed by clinician on day of visit: allergies, medications, problem list, medical history, surgical history, family history, social history, and previous encounter notes.  Time spent on visit including pre-visit chart review and post-visit care and charting was 30 minutes.   I, Water quality scientist, CMA, am acting as transcriptionist for Briscoe Deutscher, DO  I have reviewed the above documentation for accuracy and completeness, and I agree with the above. Briscoe Deutscher, DO

## 2020-09-18 ENCOUNTER — Encounter (INDEPENDENT_AMBULATORY_CARE_PROVIDER_SITE_OTHER): Payer: Self-pay | Admitting: Family Medicine

## 2020-09-28 ENCOUNTER — Other Ambulatory Visit: Payer: Self-pay

## 2020-09-28 ENCOUNTER — Ambulatory Visit (INDEPENDENT_AMBULATORY_CARE_PROVIDER_SITE_OTHER): Payer: No Typology Code available for payment source | Admitting: Family Medicine

## 2020-09-28 ENCOUNTER — Encounter (INDEPENDENT_AMBULATORY_CARE_PROVIDER_SITE_OTHER): Payer: Self-pay | Admitting: Family Medicine

## 2020-09-28 VITALS — BP 122/81 | HR 76 | Temp 98.3°F | Ht 63.0 in | Wt 168.0 lb

## 2020-09-28 DIAGNOSIS — R632 Polyphagia: Secondary | ICD-10-CM | POA: Diagnosis not present

## 2020-09-28 DIAGNOSIS — E669 Obesity, unspecified: Secondary | ICD-10-CM | POA: Diagnosis not present

## 2020-09-28 DIAGNOSIS — Z6830 Body mass index (BMI) 30.0-30.9, adult: Secondary | ICD-10-CM

## 2020-09-28 DIAGNOSIS — K219 Gastro-esophageal reflux disease without esophagitis: Secondary | ICD-10-CM

## 2020-09-28 DIAGNOSIS — E559 Vitamin D deficiency, unspecified: Secondary | ICD-10-CM

## 2020-09-28 DIAGNOSIS — E66811 Obesity, class 1: Secondary | ICD-10-CM

## 2020-09-28 DIAGNOSIS — Z9189 Other specified personal risk factors, not elsewhere classified: Secondary | ICD-10-CM

## 2020-10-01 NOTE — Progress Notes (Signed)
Chief Complaint:   OBESITY Angela Mcconnell is here to discuss her progress with her obesity treatment plan along with follow-up of her obesity related diagnoses.   Today's visit was #: 7 Starting weight: 186 lbs Starting date: 06/01/2020 Today's weight: 168 lbs Today's date: 09/28/2020 Total lbs lost to date: 18 lbs Body mass index is 29.76 kg/m.  Total weight loss percentage to date: -9.68%  Interim History: Angela Mcconnell says she has had her COVID booster.  She reports that she will start [facility] this week. Nutrition Plan: keeping a food journal and adhering to recommended goals of 1200 calories and 95 grams of protein 100% of the time.  Hunger is moderately controlled controlled. Cravings are moderately controlled controlled.  Activity: Cardio and strength training for 80 minutes 7 days per week.  Assessment/Plan:   1. Vitamin D deficiency Current vitamin D is 39.6, tested on 06/01/2020. Not at goal. Optimal goal > 50 ng/dL.   Plan:  []   Continue Vitamin D @50 ,000 IU every week. [x]   Continue home supplement daily. [x]   Follow-up for routine testing of Vitamin D at least 2-3 times per year to avoid over-replacement. []   Monitored by PCP.  2. Polyphagia She will continue to focus on protein-rich, low simple carbohydrate foods. We reviewed the importance of hydration, regular exercise for stress reduction, and restorative sleep. Medication: QMGNOI.  3. Gastroesophageal reflux disease We reviewed the diagnosis of GERD and the reasons why it was important to treat. We discussed "red flag" symptoms and the importance of follow up if symptoms persisted despite treatment. We reviewed non-pharmacologic management of GERD symptoms: including: caffeine reduction, dietary changes, elevate HOB, NPO after supper, reduction of alcohol intake, tobacco cessation, and weight loss.  4. At risk for constipation Angela Mcconnell was given approximately 8 minutes of counseling today regarding prevention of  constipation. She was encouraged to increase water and fiber intake.   5. Class 1 obesity with serious comorbidity and body mass index (BMI) of 30.0 to 30.9 in adult, unspecified obesity type  Angela Mcconnell is currently in the action stage of change. As such, her goal is to continue with weight loss efforts.   Nutrition goals: She has agreed to keeping a food journal and adhering to recommended goals of 1200 calories and 95 grams of protein.   Exercise goals: For substantial health benefits, adults should do at least 150 minutes (2 hours and 30 minutes) a week of moderate-intensity, or 75 minutes (1 hour and 15 minutes) a week of vigorous-intensity aerobic physical activity, or an equivalent combination of moderate- and vigorous-intensity aerobic activity. Aerobic activity should be performed in episodes of at least 10 minutes, and preferably, it should be spread throughout the week.  Behavioral modification strategies: increasing lean protein intake, decreasing simple carbohydrates, increasing vegetables and increasing water intake.  Angela Mcconnell has agreed to follow-up with our clinic in 3 weeks. She was informed of the importance of frequent follow-up visits to maximize her success with intensive lifestyle modifications for her multiple health conditions.   Objective:   Blood pressure 122/81, pulse 76, temperature 98.3 F (36.8 C), temperature source Oral, height 5\' 3"  (1.6 m), weight 168 lb (76.2 kg), SpO2 97 %. Body mass index is 29.76 kg/m.  General: Cooperative, alert, well developed, in no acute distress. HEENT: Conjunctivae and lids unremarkable. Cardiovascular: Regular rhythm.  Lungs: Normal work of breathing. Neurologic: No focal deficits.   Lab Results  Component Value Date   CREATININE 0.85 06/01/2020   BUN 10 06/01/2020  NA 141 06/01/2020   K 4.2 06/01/2020   CL 103 06/01/2020   CO2 24 06/01/2020   Lab Results  Component Value Date   ALT 14 06/01/2020   AST 20 06/01/2020    ALKPHOS 104 06/01/2020   BILITOT 0.4 06/01/2020   Lab Results  Component Value Date   HGBA1C 5.4 06/01/2020   Lab Results  Component Value Date   INSULIN 10.8 06/01/2020   Lab Results  Component Value Date   TSH 1.070 06/01/2020   Lab Results  Component Value Date   CHOL 226 (H) 06/01/2020   HDL 59 06/01/2020   LDLCALC 147 (H) 06/01/2020   TRIG 111 06/01/2020   CHOLHDL 3.3 02/07/2020   Lab Results  Component Value Date   WBC 8.6 06/01/2020   HGB 12.8 06/01/2020   HCT 40.1 06/01/2020   MCV 94 06/01/2020   PLT 319 06/01/2020   Lab Results  Component Value Date   IRON 95 06/01/2020   TIBC 315 06/01/2020   FERRITIN 228 (H) 06/01/2020   Attestation Statements:   Reviewed by clinician on day of visit: allergies, medications, problem list, medical history, surgical history, family history, social history, and previous encounter notes.  I, Water quality scientist, CMA, am acting as transcriptionist for Briscoe Deutscher, DO  I have reviewed the above documentation for accuracy and completeness, and I agree with the above. Briscoe Deutscher, DO

## 2020-10-13 ENCOUNTER — Other Ambulatory Visit (INDEPENDENT_AMBULATORY_CARE_PROVIDER_SITE_OTHER): Payer: Self-pay | Admitting: Family Medicine

## 2020-10-13 DIAGNOSIS — Z6834 Body mass index (BMI) 34.0-34.9, adult: Secondary | ICD-10-CM

## 2020-10-13 DIAGNOSIS — E669 Obesity, unspecified: Secondary | ICD-10-CM

## 2020-10-14 NOTE — Telephone Encounter (Signed)
Is this okay to refill? 

## 2020-10-23 ENCOUNTER — Encounter (INDEPENDENT_AMBULATORY_CARE_PROVIDER_SITE_OTHER): Payer: Self-pay | Admitting: Family Medicine

## 2020-10-23 ENCOUNTER — Other Ambulatory Visit: Payer: Self-pay

## 2020-10-23 ENCOUNTER — Ambulatory Visit (INDEPENDENT_AMBULATORY_CARE_PROVIDER_SITE_OTHER): Payer: No Typology Code available for payment source | Admitting: Family Medicine

## 2020-10-23 VITALS — BP 116/75 | HR 84 | Temp 98.2°F | Ht 63.0 in | Wt 161.0 lb

## 2020-10-23 DIAGNOSIS — E559 Vitamin D deficiency, unspecified: Secondary | ICD-10-CM | POA: Diagnosis not present

## 2020-10-23 DIAGNOSIS — R632 Polyphagia: Secondary | ICD-10-CM | POA: Diagnosis not present

## 2020-10-23 DIAGNOSIS — E669 Obesity, unspecified: Secondary | ICD-10-CM | POA: Diagnosis not present

## 2020-10-23 DIAGNOSIS — M5432 Sciatica, left side: Secondary | ICD-10-CM

## 2020-10-23 DIAGNOSIS — Z9189 Other specified personal risk factors, not elsewhere classified: Secondary | ICD-10-CM

## 2020-10-23 DIAGNOSIS — Z6830 Body mass index (BMI) 30.0-30.9, adult: Secondary | ICD-10-CM

## 2020-10-25 NOTE — Progress Notes (Signed)
Chief Complaint:   OBESITY Angela Mcconnell is here to discuss her progress with her obesity treatment plan along with follow-up of her obesity related diagnoses.   Today's visit was #: 8 Starting weight: 186 lbs Starting date: 06/01/2020 Today's weight: 161 lbs Today's date: 10/23/2020 Total lbs lost to date: 25 lbs Body mass index is 28.52 kg/m.  Total weight loss percentage to date: -13.44%  Interim History: Angela Mcconnell says she has been experiencing left-sided sciatica since doing crunches.  Her goal weight is 140 pounds. Nutrition Plan: keeping a food journal and adhering to recommended goals of 1200 calories and 95 grams of protein.  Anti-obesity medications: Wegovy.  Activity: Walking and strength training for 30-60 minutes 7 times per week.  Assessment/Plan:   1. Polyphagia Controlled on Wegovy 1 mg weekly.    She will continue to focus on protein-rich, low simple carbohydrate foods. We reviewed the importance of hydration, regular exercise for stress reduction, and restorative sleep.  2. Left sided sciatica Improving.  We discussed alternative exercises for core strengthening. Handouts provided.  3. Vitamin D deficiency Improving, but not optimized. Current vitamin D is 39.6, tested on 06/01/2020. Optimal goal > 50 ng/dL.   Plan:  []   Continue Vitamin D @50 ,000 IU every week. [x]   Continue home supplement daily. [x]   Follow-up for routine testing of Vitamin D at least 2-3 times per year to avoid over-replacement.  4. At risk for constipation Angela Mcconnell was given approximately 15 minutes of counseling today regarding prevention of constipation. She was encouraged to increase water and fiber intake.   5. Class 1 obesity with serious comorbidity and body mass index (BMI) of 30.0 to 30.9 in adult, unspecified obesity type  Course: Angela Mcconnell is currently in the action stage of change. As such, her goal is to continue with weight loss efforts.   Nutrition goals: She has agreed to  keeping a food journal and adhering to recommended goals of 1200 calories and 95 grams of protein.   Exercise goals: Try pilates.  Behavioral modification strategies: increasing lean protein intake, decreasing simple carbohydrates, increasing vegetables, increasing water intake and holiday eating strategies .  Angela Mcconnell has agreed to follow-up with our clinic in 4 weeks. She was informed of the importance of frequent follow-up visits to maximize her success with intensive lifestyle modifications for her multiple health conditions.   Objective:   Blood pressure 116/75, pulse 84, temperature 98.2 F (36.8 C), temperature source Oral, height 5\' 3"  (1.6 m), weight 161 lb (73 kg), SpO2 99 %. Body mass index is 28.52 kg/m.  General: Cooperative, alert, well developed, in no acute distress. HEENT: Conjunctivae and lids unremarkable. Cardiovascular: Regular rhythm.  Lungs: Normal work of breathing. Neurologic: No focal deficits.   Lab Results  Component Value Date   CREATININE 0.85 06/01/2020   BUN 10 06/01/2020   NA 141 06/01/2020   K 4.2 06/01/2020   CL 103 06/01/2020   CO2 24 06/01/2020   Lab Results  Component Value Date   ALT 14 06/01/2020   AST 20 06/01/2020   ALKPHOS 104 06/01/2020   BILITOT 0.4 06/01/2020   Lab Results  Component Value Date   HGBA1C 5.4 06/01/2020   Lab Results  Component Value Date   INSULIN 10.8 06/01/2020   Lab Results  Component Value Date   TSH 1.070 06/01/2020   Lab Results  Component Value Date   CHOL 226 (H) 06/01/2020   HDL 59 06/01/2020   LDLCALC 147 (H) 06/01/2020  TRIG 111 06/01/2020   CHOLHDL 3.3 02/07/2020   Lab Results  Component Value Date   WBC 8.6 06/01/2020   HGB 12.8 06/01/2020   HCT 40.1 06/01/2020   MCV 94 06/01/2020   PLT 319 06/01/2020   Lab Results  Component Value Date   IRON 95 06/01/2020   TIBC 315 06/01/2020   FERRITIN 228 (H) 06/01/2020   Attestation Statements:   Reviewed by clinician on day of  visit: allergies, medications, problem list, medical history, surgical history, family history, social history, and previous encounter notes.  I, Water quality scientist, CMA, am acting as transcriptionist for Briscoe Deutscher, DO  I have reviewed the above documentation for accuracy and completeness, and I agree with the above. Briscoe Deutscher, DO

## 2020-11-20 ENCOUNTER — Encounter (INDEPENDENT_AMBULATORY_CARE_PROVIDER_SITE_OTHER): Payer: Self-pay | Admitting: Family Medicine

## 2020-11-20 ENCOUNTER — Other Ambulatory Visit: Payer: Self-pay

## 2020-11-20 ENCOUNTER — Ambulatory Visit (INDEPENDENT_AMBULATORY_CARE_PROVIDER_SITE_OTHER): Payer: No Typology Code available for payment source | Admitting: Family Medicine

## 2020-11-20 VITALS — BP 97/68 | HR 81 | Temp 98.1°F | Ht 63.0 in | Wt 155.0 lb

## 2020-11-20 DIAGNOSIS — E669 Obesity, unspecified: Secondary | ICD-10-CM | POA: Diagnosis not present

## 2020-11-20 DIAGNOSIS — Z6830 Body mass index (BMI) 30.0-30.9, adult: Secondary | ICD-10-CM

## 2020-11-20 DIAGNOSIS — E559 Vitamin D deficiency, unspecified: Secondary | ICD-10-CM

## 2020-11-20 DIAGNOSIS — Z9189 Other specified personal risk factors, not elsewhere classified: Secondary | ICD-10-CM | POA: Diagnosis not present

## 2020-11-20 DIAGNOSIS — E8881 Metabolic syndrome: Secondary | ICD-10-CM | POA: Diagnosis not present

## 2020-11-20 DIAGNOSIS — R632 Polyphagia: Secondary | ICD-10-CM

## 2020-11-20 NOTE — Progress Notes (Signed)
Chief Complaint:   OBESITY Angela Mcconnell is here to discuss her progress with her obesity treatment plan along with follow-up of her obesity related diagnoses.   Today's visit was #: 9 Starting weight: 186 lbs Starting date: 06/01/2020 Today's weight: 155 lbs Today's date: 11/20/2020 Total lbs lost to date: 31 lbs Body mass index is 27.46 kg/m.  Total weight loss percentage to date: -16.67%  Interim History: Doing well. Happy with progress. Glady's goal is around 140 pounds.   Nutrition Plan: keeping a food journal and adhering to recommended goals of 1200 calories and 95 grams of protein.  Anti-obesity medications: Wegovy. Reported side effects: None. Hunger is well controlled. Cravings are well controlled.  Activity: Walking/strength training for 45-60 minutes 3-7 times per week.  Assessment/Plan:   1. Vitamin D deficiency Improving, but not optimized. Current vitamin D is 39.6, tested on 06/01/2020. Optimal goal > 50 ng/dL.   Plan:  []   Continue Vitamin D @50 ,000 IU every week. [x]   Continue home supplement daily. [x]   Follow-up for routine testing of Vitamin D at least 2-3 times per year to avoid over-replacement.  2. Polyphagia Improved. She will continue to focus on protein-rich, low simple carbohydrate foods. We reviewed the importance of hydration, regular exercise for stress reduction, and restorative sleep.  Angela Mcconnell is taking Wegovy 1.7 mg subcutaneously weekly.  3. Insulin resistance At goal. Goal is HgbA1c < 5.7, fasting insulin closer to 5.  She will continue to focus on protein-rich, low simple carbohydrate foods. We reviewed the importance of hydration, regular exercise for stress reduction, and restorative sleep.   Lab Results  Component Value Date   HGBA1C 5.4 06/01/2020   Lab Results  Component Value Date   INSULIN 10.8 06/01/2020   4. At risk for nausea Sharnise Blough was given approximately 8 minutes of nausea prevention counseling today. Angela Mcconnell is  at risk for nausea due to [facility] - could worsen with increasing to 2.4 mg dose. She was encouraged to titrate her medication slowly, make sure to stay hydrated, eat smaller portions throughout the day, and avoid high fat meals.   5. Class 1 obesity with serious comorbidity and body mass index (BMI) of 30.0 to 30.9 in adult, unspecified obesity type  Course: Angela Mcconnell is currently in the action stage of change. As such, her goal is to continue with weight loss efforts.   Nutrition goals: She has agreed to keeping a food journal and adhering to recommended goals of 1200 calories and 95 grams of protein.   Exercise goals: For substantial health benefits, adults should do at least 150 minutes (2 hours and 30 minutes) a week of moderate-intensity, or 75 minutes (1 hour and 15 minutes) a week of vigorous-intensity aerobic physical activity, or an equivalent combination of moderate- and vigorous-intensity aerobic activity. Aerobic activity should be performed in episodes of at least 10 minutes, and preferably, it should be spread throughout the week.  Behavioral modification strategies: increasing lean protein intake, decreasing simple carbohydrates and increasing vegetables.  Darlen has agreed to follow-up with our clinic in 4 weeks. She was informed of the importance of frequent follow-up visits to maximize her success with intensive lifestyle modifications for her multiple health conditions.   Objective:   Blood pressure 97/68, pulse 81, temperature 98.1 F (36.7 C), temperature source Oral, height 5\' 3"  (1.6 m), weight 155 lb (70.3 kg), SpO2 99 %. Body mass index is 27.46 kg/m.  General: Cooperative, alert, well developed, in no acute distress. HEENT: Conjunctivae  and lids unremarkable. Cardiovascular: Regular rhythm.  Lungs: Normal work of breathing. Neurologic: No focal deficits.   Lab Results  Component Value Date   CREATININE 0.85 06/01/2020   BUN 10 06/01/2020   NA 141 06/01/2020   K  4.2 06/01/2020   CL 103 06/01/2020   CO2 24 06/01/2020   Lab Results  Component Value Date   ALT 14 06/01/2020   AST 20 06/01/2020   ALKPHOS 104 06/01/2020   BILITOT 0.4 06/01/2020   Lab Results  Component Value Date   HGBA1C 5.4 06/01/2020   Lab Results  Component Value Date   INSULIN 10.8 06/01/2020   Lab Results  Component Value Date   TSH 1.070 06/01/2020   Lab Results  Component Value Date   CHOL 226 (H) 06/01/2020   HDL 59 06/01/2020   LDLCALC 147 (H) 06/01/2020   TRIG 111 06/01/2020   CHOLHDL 3.3 02/07/2020   Lab Results  Component Value Date   WBC 8.6 06/01/2020   HGB 12.8 06/01/2020   HCT 40.1 06/01/2020   MCV 94 06/01/2020   PLT 319 06/01/2020   Lab Results  Component Value Date   IRON 95 06/01/2020   TIBC 315 06/01/2020   FERRITIN 228 (H) 06/01/2020   Attestation Statements:   Reviewed by clinician on day of visit: allergies, medications, problem list, medical history, surgical history, family history, social history, and previous encounter notes.  I, Water quality scientist, CMA, am acting as transcriptionist for Briscoe Deutscher, DO  I have reviewed the above documentation for accuracy and completeness, and I agree with the above. Briscoe Deutscher, DO

## 2020-11-22 ENCOUNTER — Other Ambulatory Visit (INDEPENDENT_AMBULATORY_CARE_PROVIDER_SITE_OTHER): Payer: Self-pay | Admitting: Family Medicine

## 2020-11-22 ENCOUNTER — Encounter (INDEPENDENT_AMBULATORY_CARE_PROVIDER_SITE_OTHER): Payer: Self-pay | Admitting: Family Medicine

## 2020-11-22 DIAGNOSIS — E669 Obesity, unspecified: Secondary | ICD-10-CM

## 2020-11-22 DIAGNOSIS — Z6834 Body mass index (BMI) 34.0-34.9, adult: Secondary | ICD-10-CM

## 2020-11-27 MED ORDER — WEGOVY 1 MG/0.5ML ~~LOC~~ SOAJ: 1.0000 mg | SUBCUTANEOUS | 0 refills | Status: DC

## 2020-11-27 MED ORDER — WEGOVY 1.7 MG/0.75ML ~~LOC~~ SOAJ: 1.7000 mg | SUBCUTANEOUS | 0 refills | Status: DC

## 2020-11-27 NOTE — Addendum Note (Signed)
Addended by: Scarlett Presto on: 11/27/2020 07:18 AM   Modules accepted: Orders

## 2020-11-27 NOTE — Addendum Note (Signed)
Addended by: Karren Cobble on: 11/27/2020 10:20 AM   Modules accepted: Orders

## 2020-12-21 ENCOUNTER — Telehealth (INDEPENDENT_AMBULATORY_CARE_PROVIDER_SITE_OTHER): Payer: No Typology Code available for payment source | Admitting: Family Medicine

## 2020-12-21 ENCOUNTER — Encounter (INDEPENDENT_AMBULATORY_CARE_PROVIDER_SITE_OTHER): Payer: Self-pay | Admitting: Family Medicine

## 2020-12-21 DIAGNOSIS — E669 Obesity, unspecified: Secondary | ICD-10-CM | POA: Diagnosis not present

## 2020-12-21 DIAGNOSIS — R632 Polyphagia: Secondary | ICD-10-CM

## 2020-12-21 DIAGNOSIS — E559 Vitamin D deficiency, unspecified: Secondary | ICD-10-CM

## 2020-12-21 DIAGNOSIS — E8881 Metabolic syndrome: Secondary | ICD-10-CM

## 2020-12-21 DIAGNOSIS — Z6830 Body mass index (BMI) 30.0-30.9, adult: Secondary | ICD-10-CM

## 2020-12-25 ENCOUNTER — Encounter (INDEPENDENT_AMBULATORY_CARE_PROVIDER_SITE_OTHER): Payer: Self-pay | Admitting: Family Medicine

## 2020-12-25 NOTE — Progress Notes (Signed)
TeleHealth Visit:  Due to the COVID-19 pandemic, this visit was completed with telemedicine (audio/video) technology to reduce patient and provider exposure as well as to preserve personal protective equipment.   [name] has verbally consented to this TeleHealth visit. The patient is located at home, the provider is located at the [facility] office. The participants in this visit include the listed provider and patient. The visit was conducted today via MyChart video.  Chief Complaint: OBESITY Angela Mcconnell is here to discuss her progress with her obesity treatment plan along with follow-up of her obesity related diagnoses. Angela Mcconnell is on keeping a food journal and adhering to recommended goals of 1200 calories and 95 grams of protein and states she is following her eating plan approximately 100% of the time. Angela Mcconnell states she is doing yoga for 50 minutes once per week and lifting weights for 30 minutes 3 times per week.  Today's visit was #: 10 Starting weight: 186 lbs Starting date: 06/01/2020  Interim History: Angela Mcconnell says she is doing well.  She is on Wegovy 1 mg since 1.7 mg was not available.  She says she has increased hunger.  Assessment/Plan:   1. Polyphagia Hyperphagia, also called polyphagia, refers to excessive feelings of hunger. This is more likely to be an issues for people that have diabetes, prediabetes, or insulin resistance. She will continue to focus on protein-rich, low simple carbohydrate foods. We reviewed the importance of hydration, regular exercise for stress reduction, and restorative sleep.  2. Insulin resistance At goal. Goal is HgbA1c < 5.7, fasting insulin closer to 5.  Medication: Wegovy 1 mg subcutaneously weekly.  She will continue to focus on protein-rich, low simple carbohydrate foods. We reviewed the importance of hydration, regular exercise for stress reduction, and restorative sleep.   Lab Results  Component Value Date   HGBA1C 5.4  06/01/2020   Lab Results  Component Value Date   INSULIN 10.8 06/01/2020   3. Vitamin D deficiency At goal. Current vitamin D is 39.6, tested on 06/01/2020. Optimal goal > 50 ng/dL.   Plan:  []   Continue Vitamin D @50 ,000 IU every week. [x]   Continue home supplement daily. [x]   Follow-up for routine testing of Vitamin D at least 2-3 times per year to avoid over-replacement.  4. Class 1 obesity with serious comorbidity and body mass index (BMI) of 30.0 to 30.9 in adult, unspecified obesity type  Angela Mcconnell is currently in the action stage of change. As such, her goal is to continue with weight loss efforts. She has agreed to keeping a food journal and adhering to recommended goals of 1200 calories and 85 grams of protein.   Exercise goals: For substantial health benefits, adults should do at least 150 minutes (2 hours and 30 minutes) a week of moderate-intensity, or 75 minutes (1 hour and 15 minutes) a week of vigorous-intensity aerobic physical activity, or an equivalent combination of moderate- and vigorous-intensity aerobic activity. Aerobic activity should be performed in episodes of at least 10 minutes, and preferably, it should be spread throughout the week.  Behavioral modification strategies: increasing lean protein intake, decreasing simple carbohydrates, increasing vegetables and increasing water intake.  Angela Mcconnell has agreed to follow-up with our clinic in 3 weeks. She was informed of the importance of frequent follow-up visits to maximize her success with intensive lifestyle modifications for her multiple health conditions.  Objective:   VITALS: Per patient if applicable, see vitals. GENERAL: Alert and in no acute distress. CARDIOPULMONARY: No increased WOB. Speaking  in clear sentences.  PSYCH: Pleasant and cooperative. Speech normal rate and rhythm. Affect is appropriate. Insight and judgement are appropriate. Attention is focused, linear, and appropriate.  NEURO: Oriented as  arrived to appointment on time with no prompting.   Lab Results  Component Value Date   CREATININE 0.85 06/01/2020   BUN 10 06/01/2020   NA 141 06/01/2020   K 4.2 06/01/2020   CL 103 06/01/2020   CO2 24 06/01/2020   Lab Results  Component Value Date   ALT 14 06/01/2020   AST 20 06/01/2020   ALKPHOS 104 06/01/2020   BILITOT 0.4 06/01/2020   Lab Results  Component Value Date   HGBA1C 5.4 06/01/2020   Lab Results  Component Value Date   INSULIN 10.8 06/01/2020   Lab Results  Component Value Date   TSH 1.070 06/01/2020   Lab Results  Component Value Date   CHOL 226 (H) 06/01/2020   HDL 59 06/01/2020   LDLCALC 147 (H) 06/01/2020   TRIG 111 06/01/2020   CHOLHDL 3.3 02/07/2020   Lab Results  Component Value Date   WBC 8.6 06/01/2020   HGB 12.8 06/01/2020   HCT 40.1 06/01/2020   MCV 94 06/01/2020   PLT 319 06/01/2020   Lab Results  Component Value Date   IRON 95 06/01/2020   TIBC 315 06/01/2020   FERRITIN 228 (H) 06/01/2020   Attestation Statements:   Reviewed by clinician on day of visit: allergies, medications, problem list, medical history, surgical history, family history, social history, and previous encounter notes.  I, Water quality scientist, CMA, am acting as transcriptionist for Briscoe Deutscher, DO  I have reviewed the above documentation for accuracy and completeness, and I agree with the above. Briscoe Deutscher, DO

## 2020-12-27 ENCOUNTER — Encounter (INDEPENDENT_AMBULATORY_CARE_PROVIDER_SITE_OTHER): Payer: Self-pay | Admitting: Family Medicine

## 2020-12-28 NOTE — Telephone Encounter (Signed)
Dr.Wallace °

## 2021-01-23 ENCOUNTER — Ambulatory Visit (INDEPENDENT_AMBULATORY_CARE_PROVIDER_SITE_OTHER): Payer: No Typology Code available for payment source | Admitting: Family Medicine

## 2021-01-23 ENCOUNTER — Encounter (INDEPENDENT_AMBULATORY_CARE_PROVIDER_SITE_OTHER): Payer: Self-pay | Admitting: Family Medicine

## 2021-01-23 ENCOUNTER — Other Ambulatory Visit: Payer: Self-pay

## 2021-01-23 VITALS — BP 120/75 | HR 80 | Temp 98.0°F | Ht 63.0 in | Wt 142.0 lb

## 2021-01-23 DIAGNOSIS — E669 Obesity, unspecified: Secondary | ICD-10-CM | POA: Diagnosis not present

## 2021-01-23 DIAGNOSIS — Z9189 Other specified personal risk factors, not elsewhere classified: Secondary | ICD-10-CM

## 2021-01-23 DIAGNOSIS — R632 Polyphagia: Secondary | ICD-10-CM

## 2021-01-23 DIAGNOSIS — Z6830 Body mass index (BMI) 30.0-30.9, adult: Secondary | ICD-10-CM | POA: Diagnosis not present

## 2021-01-23 MED ORDER — WEGOVY 1.7 MG/0.75ML ~~LOC~~ SOAJ: 1.7000 mg | SUBCUTANEOUS | 0 refills | Status: DC

## 2021-01-25 NOTE — Progress Notes (Signed)
Chief Complaint:   OBESITY Angela Mcconnell is here to discuss her progress with her obesity treatment plan along with follow-up of her obesity related diagnoses.   Today's visit was #: 11 Starting weight: 186 lbs Starting date: 06/01/2020 Today's weight: 142 lbs Today's date: 01/23/2021 Total lbs lost to date: 44 lbs Body mass index is 25.15 kg/m.  Total weight loss percentage to date: -23.66%  Current Meal Plan: keeping a food journal and adhering to recommended goals of 1200 calories and 85 grams of protein.  Current Exercise Plan: Walking/strength training for 60 minutes 5 times per week. Current Anti-Obesity Medications: Wegovy 1.7 mg subcutaneously weekly. Side effects: None.  Assessment/Plan:   1. Polyphagia Controlled. Current treatment: Wegovy 1.7 mg subcutaneously weekly. Polyphagia refers to excessive feelings of hunger.  Plan:  Continue [id number].  She will continue to focus on protein-rich, low simple carbohydrate foods. We reviewed the importance of hydration, regular exercise for stress reduction, and restorative sleep.  - Refill Semaglutide-Weight Management (WEGOVY) 1.7 MG/0.75ML SOAJ; Inject 1.7 mg into the skin once a week.  Dispense: 9 mL; Refill: 0  2. At risk for deficient intake of food Angela Mcconnell was given extensive education and counseling today of more than 8 minutes on risks associated with deficient food intake.  Counseled her on the importance of following our prescribed meal plan and eating adequate amounts of protein.  Discussed with Angela Mcconnell that inadequate food intake over longer periods of time can slow their metabolism down significantly.   3. Class 1 obesity with serious comorbidity and body mass index (BMI) of 30.0 to 30.9 in adult, unspecified obesity type  Course: Angela Mcconnell is currently in the action stage of change. As such, her goal is to work on maintaining her current weight.   Nutrition goals: She has agreed to a maintenance plan of 1600  calories and 95 grams of protein per day.   Exercise goals: As is.  Behavioral modification strategies:  lean protein intake.  Angela Mcconnell has agreed to follow-up with our clinic in 4 weeks. She was informed of the importance of frequent follow-up visits to maximize her success with intensive lifestyle modifications for her multiple health conditions.   Objective:   Blood pressure 120/75, pulse 80, temperature 98 F (36.7 C), temperature source Oral, height 5\' 3"  (1.6 m), weight 142 lb (64.4 kg), SpO2 100 %. Body mass index is 25.15 kg/m.  General: Cooperative, alert, well developed, in no acute distress. HEENT: Conjunctivae and lids unremarkable. Cardiovascular: Regular rhythm.  Lungs: Normal work of breathing. Neurologic: No focal deficits.   Lab Results  Component Value Date   CREATININE 0.85 06/01/2020   BUN 10 06/01/2020   NA 141 06/01/2020   K 4.2 06/01/2020   CL 103 06/01/2020   CO2 24 06/01/2020   Lab Results  Component Value Date   ALT 14 06/01/2020   AST 20 06/01/2020   ALKPHOS 104 06/01/2020   BILITOT 0.4 06/01/2020   Lab Results  Component Value Date   HGBA1C 5.4 06/01/2020   Lab Results  Component Value Date   INSULIN 10.8 06/01/2020   Lab Results  Component Value Date   TSH 1.070 06/01/2020   Lab Results  Component Value Date   CHOL 226 (H) 06/01/2020   HDL 59 06/01/2020   LDLCALC 147 (H) 06/01/2020   TRIG 111 06/01/2020   CHOLHDL 3.3 02/07/2020   Lab Results  Component Value Date   WBC 8.6 06/01/2020   HGB 12.8 06/01/2020  HCT 40.1 06/01/2020   MCV 94 06/01/2020   PLT 319 06/01/2020   Lab Results  Component Value Date   IRON 95 06/01/2020   TIBC 315 06/01/2020   FERRITIN 228 (H) 06/01/2020   Attestation Statements:   Reviewed by clinician on day of visit: allergies, medications, problem list, medical history, surgical history, family history, social history, and previous encounter notes.  I, Water quality scientist, CMA, am acting as  transcriptionist for Briscoe Deutscher, DO  I have reviewed the above documentation for accuracy and completeness, and I agree with the above. Briscoe Deutscher, DO

## 2021-02-08 ENCOUNTER — Other Ambulatory Visit: Payer: Self-pay | Admitting: Family Medicine

## 2021-02-08 DIAGNOSIS — F419 Anxiety disorder, unspecified: Secondary | ICD-10-CM

## 2021-02-08 DIAGNOSIS — G4709 Other insomnia: Secondary | ICD-10-CM

## 2021-02-08 NOTE — Telephone Encounter (Signed)
Requested Prescriptions  Pending Prescriptions Disp Refills  . hydrOXYzine (ATARAX/VISTARIL) 25 MG tablet [Pharmacy Med Name: HYDROXYZINE HCL 25 MG TABLET] 90 tablet 0    Sig: TAKE 1 TABLET (25 MG TOTAL) BY MOUTH AT BEDTIME AS NEEDED.     Ear, Nose, and Throat:  Antihistamines Passed - 02/08/2021  1:34 AM      Passed - Valid encounter within last 12 months    Recent Outpatient Visits          9 months ago Hypercholesterolemia   [place], Enobong, MD   1 year ago [place], Enobong, MD   1 year ago Other insomnia   [facility] Charlott Rakes, MD

## 2021-02-15 ENCOUNTER — Encounter (INDEPENDENT_AMBULATORY_CARE_PROVIDER_SITE_OTHER): Payer: Self-pay | Admitting: Family Medicine

## 2021-02-20 ENCOUNTER — Encounter (INDEPENDENT_AMBULATORY_CARE_PROVIDER_SITE_OTHER): Payer: Self-pay | Admitting: Family Medicine

## 2021-02-20 ENCOUNTER — Other Ambulatory Visit: Payer: Self-pay

## 2021-02-20 ENCOUNTER — Ambulatory Visit (INDEPENDENT_AMBULATORY_CARE_PROVIDER_SITE_OTHER): Payer: No Typology Code available for payment source | Admitting: Family Medicine

## 2021-02-20 VITALS — BP 119/82 | HR 75 | Temp 98.0°F | Ht 63.0 in | Wt 138.0 lb

## 2021-02-20 DIAGNOSIS — E669 Obesity, unspecified: Secondary | ICD-10-CM

## 2021-02-20 DIAGNOSIS — R632 Polyphagia: Secondary | ICD-10-CM

## 2021-02-20 DIAGNOSIS — E8881 Metabolic syndrome: Secondary | ICD-10-CM | POA: Diagnosis not present

## 2021-02-20 DIAGNOSIS — Z9189 Other specified personal risk factors, not elsewhere classified: Secondary | ICD-10-CM

## 2021-02-20 DIAGNOSIS — E7800 Pure hypercholesterolemia, unspecified: Secondary | ICD-10-CM | POA: Diagnosis not present

## 2021-02-20 DIAGNOSIS — E88819 Insulin resistance, unspecified: Secondary | ICD-10-CM

## 2021-02-20 DIAGNOSIS — Z6830 Body mass index (BMI) 30.0-30.9, adult: Secondary | ICD-10-CM

## 2021-02-28 NOTE — Progress Notes (Signed)
Chief Complaint:   OBESITY Angela Mcconnell is here to discuss her progress with her obesity treatment plan along with follow-up of her obesity related diagnoses.   Today's visit was #: 12 Starting weight: 186 lbs Starting date: 06/01/2020 Today's weight: 138 lbs Today's date: 02/20/2021 Total lbs lost to date: 48 lbs Body mass index is 24.45 kg/m.  Total weight loss percentage to date: -25.81%  Interim History:  Angela Mcconnell's BMI is 24.  She will work on maintenance now. Current Meal Plan: keeping a food journal and adhering to recommended goals of 1600 calories and 95 grams of protein for 95% of the time.  Current Exercise Plan: Walking (not this last week) for 30-60 minutes 5 times per week. Current Anti-Obesity Medications: Wegovy 1.7 mg subcutaneously weekly. Side effects: None.  Assessment/Plan:   1. Polyphagia Controlled. Current treatment: Wegovy 1.7 mg subcutaneously weekly. Polyphagia refers to excessive feelings of hunger. She will continue to focus on protein-rich, low simple carbohydrate foods. We reviewed the importance of hydration, regular exercise for stress reduction, and restorative sleep.  2. Pure hypercholesterolemia Course: Not at goal. Lipid-lowering medications: None.   Plan: Dietary changes: Increase soluble fiber, decrease simple carbohydrates, decrease saturated fat. Exercise changes: Moderate to vigorous-intensity aerobic activity 150 minutes per week or as tolerated. We will continue to monitor along with PCP/specialists as it pertains to her weight loss journey.  Lab Results  Component Value Date   CHOL 226 (H) 06/01/2020   HDL 59 06/01/2020   LDLCALC 147 (H) 06/01/2020   TRIG 111 06/01/2020   CHOLHDL 3.3 02/07/2020   Lab Results  Component Value Date   ALT 14 06/01/2020   AST 20 06/01/2020   ALKPHOS 104 06/01/2020   BILITOT 0.4 06/01/2020   The 10-year ASCVD risk score Mikey Bussing DC Jr., et al., 2013) is: 3.7%   Values used to calculate the score:      Age: 31 years     Sex: Female     Is Non-Hispanic African American: Yes     Diabetic: No     Tobacco smoker: No     Systolic Blood Pressure: 485 mmHg     Is BP treated: No     HDL Cholesterol: 59 mg/dL     Total Cholesterol: 226 mg/dL  3. Insulin resistance Not at goal. Goal is HgbA1c < 5.7, fasting insulin closer to 5.  Medication: Wegovy 1.7 mg subcutaneously weekly.    Plan:  She will continue to focus on protein-rich, low simple carbohydrate foods. We reviewed the importance of hydration, regular exercise for stress reduction, and restorative sleep.   Lab Results  Component Value Date   HGBA1C 5.4 06/01/2020   Lab Results  Component Value Date   INSULIN 10.8 06/01/2020   4. At risk for deficient intake of food Angela Mcconnell was given extensive education and counseling today of more than 8 minutes on risks associated with deficient food intake.  Counseled her on the importance of following our prescribed meal plan and eating adequate amounts of protein.  Discussed with Angela Mcconnell that inadequate food intake over longer periods of time can slow their metabolism down significantly.   5. Class 1 obesity with serious comorbidity and body mass index (BMI) of 30.0 to 30.9 in adult, unspecified obesity type  Course: Angela Mcconnell is currently in the action stage of change. As such, her goal is to continue with maintaining her current weight.   Nutrition goals: She has agreed to keeping a food journal and  adhering to recommended goals of 1600 calories and 95 grams of protein.   Exercise goals: As is.  Behavioral modification strategies: increasing lean protein intake, increasing vegetables and increasing water intake.  Angela Mcconnell has agreed to follow-up with our clinic in 6-8 weeks. She was informed of the importance of frequent follow-up visits to maximize her success with intensive lifestyle modifications for her multiple health conditions.   Objective:   Blood pressure 119/82, pulse  75, temperature 98 F (36.7 C), temperature source Oral, height 5\' 3"  (1.6 m), weight 138 lb (62.6 kg), SpO2 98 %. Body mass index is 24.45 kg/m.  General: Cooperative, alert, well developed, in no acute distress. HEENT: Conjunctivae and lids unremarkable. Cardiovascular: Regular rhythm.  Lungs: Normal work of breathing. Neurologic: No focal deficits.   Lab Results  Component Value Date   CREATININE 0.85 06/01/2020   BUN 10 06/01/2020   NA 141 06/01/2020   K 4.2 06/01/2020   CL 103 06/01/2020   CO2 24 06/01/2020   Lab Results  Component Value Date   ALT 14 06/01/2020   AST 20 06/01/2020   ALKPHOS 104 06/01/2020   BILITOT 0.4 06/01/2020   Lab Results  Component Value Date   HGBA1C 5.4 06/01/2020   Lab Results  Component Value Date   INSULIN 10.8 06/01/2020   Lab Results  Component Value Date   TSH 1.070 06/01/2020   Lab Results  Component Value Date   CHOL 226 (H) 06/01/2020   HDL 59 06/01/2020   LDLCALC 147 (H) 06/01/2020   TRIG 111 06/01/2020   CHOLHDL 3.3 02/07/2020   Lab Results  Component Value Date   WBC 8.6 06/01/2020   HGB 12.8 06/01/2020   HCT 40.1 06/01/2020   MCV 94 06/01/2020   PLT 319 06/01/2020   Lab Results  Component Value Date   IRON 95 06/01/2020   TIBC 315 06/01/2020   FERRITIN 228 (H) 06/01/2020   Attestation Statements:   Reviewed by clinician on day of visit: allergies, medications, problem list, medical history, surgical history, family history, social history, and previous encounter notes.  I, Water quality scientist, CMA, am acting as transcriptionist for Briscoe Deutscher, DO  I have reviewed the above documentation for accuracy and completeness, and I agree with the above. Briscoe Deutscher, DO

## 2021-03-26 ENCOUNTER — Other Ambulatory Visit: Payer: Self-pay | Admitting: Family Medicine

## 2021-03-26 DIAGNOSIS — Z1231 Encounter for screening mammogram for malignant neoplasm of breast: Secondary | ICD-10-CM

## 2021-04-03 ENCOUNTER — Other Ambulatory Visit: Payer: Self-pay

## 2021-04-03 ENCOUNTER — Ambulatory Visit (INDEPENDENT_AMBULATORY_CARE_PROVIDER_SITE_OTHER): Payer: No Typology Code available for payment source | Admitting: Family Medicine

## 2021-04-03 ENCOUNTER — Encounter (INDEPENDENT_AMBULATORY_CARE_PROVIDER_SITE_OTHER): Payer: Self-pay | Admitting: Family Medicine

## 2021-04-03 VITALS — BP 112/70 | HR 90 | Temp 98.6°F | Ht 63.0 in | Wt 133.0 lb

## 2021-04-03 DIAGNOSIS — Z9189 Other specified personal risk factors, not elsewhere classified: Secondary | ICD-10-CM

## 2021-04-03 DIAGNOSIS — R632 Polyphagia: Secondary | ICD-10-CM

## 2021-04-03 DIAGNOSIS — Z6833 Body mass index (BMI) 33.0-33.9, adult: Secondary | ICD-10-CM

## 2021-04-03 DIAGNOSIS — E669 Obesity, unspecified: Secondary | ICD-10-CM

## 2021-04-03 MED ORDER — WEGOVY 1 MG/0.5ML ~~LOC~~ SOAJ: 1.0000 mg | SUBCUTANEOUS | 0 refills | Status: DC

## 2021-04-10 NOTE — Progress Notes (Signed)
Chief Complaint:   OBESITY Angela Mcconnell is here to discuss her progress with her obesity treatment plan along with follow-up of her obesity related diagnoses.   Today's visit was #: 13 Starting weight: 186 lbs Starting date: 06/01/2020 Today's weight: 133 lbs Today's date: 04/03/2021 Total lbs lost to date: 53 lbs Body mass index is 23.56 kg/m.  Total weight loss percentage to date: -28.49%  Interim History:  Angela Mcconnell says she went to [place] and enjoyed 9 days of theme parks.  She is running a 5K on Saturday.  She has a history of bilateral knee pain, now resolved.  Current Meal Plan: keeping a food journal and adhering to recommended goals of 1400 calories and 95 grams of protein for a small percentage of the time.  Current Exercise Plan: Walking for 60 minutes 6-7 times per week. Current Anti-Obesity Medications: Wegovy 1.7 mg subcutaneously weekly. Side effects: None.  Assessment/Plan:   1. Polyphagia Current treatment: Wegovy 1.7 mg subcutaneously weekly. She sometimes struggles to eat enough, she reports.  Plan:  Will decrease Wegovy to 1 mg subcutaneously weekly.  She will continue to focus on protein-rich, low simple carbohydrate foods. We reviewed the importance of hydration, regular exercise for stress reduction, and restorative sleep.  - Decrease Semaglutide-Weight Management (WEGOVY) 1 MG/0.5ML SOAJ; Inject 1 mg into the skin once a week.  Dispense: 2 mL; Refill: 0  2. At risk for deficient intake of food Angela Mcconnell was given extensive education and counseling today of more than 9 minutes on risks associated with deficient food intake.  Counseled her on the importance of following our prescribed meal plan and eating adequate amounts of protein.  Discussed with Angela Mcconnell that inadequate food intake over longer periods of time can slow their metabolism down significantly.   3. Obesity, current BMI 23.6  Course: Angela Mcconnell is currently in the action stage of change. As such, her  goal is to continue with weight loss efforts.   Nutrition goals: She has agreed to keeping a food journal and adhering to recommended goals of 1400 calories and 95 grams of protein.   Exercise goals: As is.  Behavioral modification strategies: increasing lean protein intake, decreasing simple carbohydrates, increasing vegetables and increasing water intake.  Angela Mcconnell has agreed to follow-up with our clinic in 4-6 weeks. She was informed of the importance of frequent follow-up visits to maximize her success with intensive lifestyle modifications for her multiple health conditions.   Objective:   Blood pressure 112/70, pulse 90, temperature 98.6 F (37 C), temperature source Oral, height 5\' 3"  (1.6 m), weight 133 lb (60.3 kg), SpO2 95 %. Body mass index is 23.56 kg/m.  General: Cooperative, alert, well developed, in no acute distress. HEENT: Conjunctivae and lids unremarkable. Cardiovascular: Regular rhythm.  Lungs: Normal work of breathing. Neurologic: No focal deficits.   Lab Results  Component Value Date   CREATININE 0.85 06/01/2020   BUN 10 06/01/2020   NA 141 06/01/2020   K 4.2 06/01/2020   CL 103 06/01/2020   CO2 24 06/01/2020   Lab Results  Component Value Date   ALT 14 06/01/2020   AST 20 06/01/2020   ALKPHOS 104 06/01/2020   BILITOT 0.4 06/01/2020   Lab Results  Component Value Date   HGBA1C 5.4 06/01/2020   Lab Results  Component Value Date   INSULIN 10.8 06/01/2020   Lab Results  Component Value Date   TSH 1.070 06/01/2020   Lab Results  Component Value Date   CHOL 226 (  H) 06/01/2020   HDL 59 06/01/2020   LDLCALC 147 (H) 06/01/2020   TRIG 111 06/01/2020   CHOLHDL 3.3 02/07/2020   Lab Results  Component Value Date   WBC 8.6 06/01/2020   HGB 12.8 06/01/2020   HCT 40.1 06/01/2020   MCV 94 06/01/2020   PLT 319 06/01/2020   Lab Results  Component Value Date   IRON 95 06/01/2020   TIBC 315 06/01/2020   FERRITIN 228 (H) 06/01/2020    Attestation Statements:   Reviewed by clinician on day of visit: allergies, medications, problem list, medical history, surgical history, family history, social history, and previous encounter notes.  I, Water quality scientist, CMA, am acting as transcriptionist for Briscoe Deutscher, DO  I have reviewed the above documentation for accuracy and completeness, and I agree with the above. Briscoe Deutscher, DO

## 2021-04-17 IMAGING — DX DG CHEST 1V PORT
1 series · 1 of 1 positions shown · non-contrast
Comparison: 07/10/2015 chest radiograph and prior.

CLINICAL DATA: Shortness of breath

EXAM:
PORTABLE CHEST 1 VIEW

[chest]
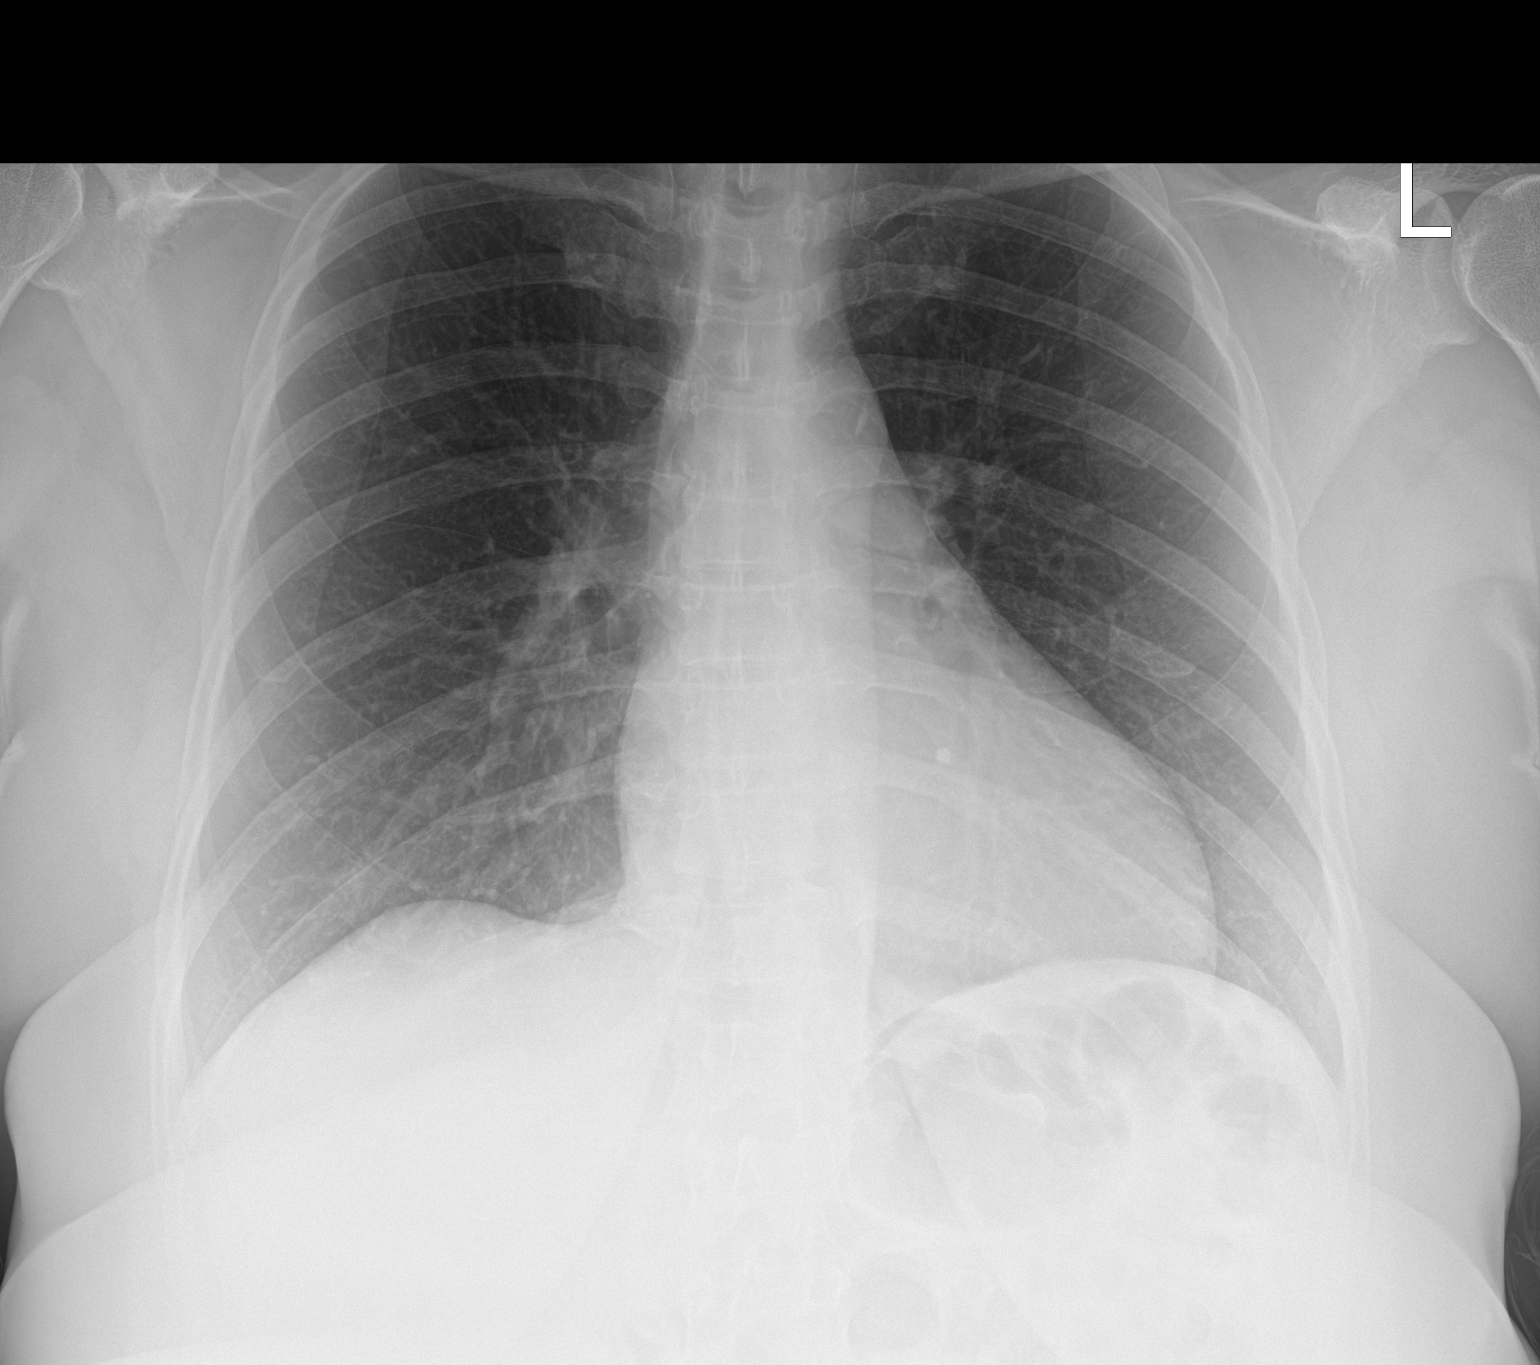

[1 of 1 positions shown; findings below may reference images not displayed]

FINDINGS: The heart size and mediastinal contours are within normal limits.
Both lungs are clear. No pneumothorax or pleural effusion. No acute
osseous abnormality.
IMPRESSION: No focal airspace disease.

## 2021-05-02 ENCOUNTER — Other Ambulatory Visit: Payer: Self-pay | Admitting: Family Medicine

## 2021-05-02 DIAGNOSIS — G4709 Other insomnia: Secondary | ICD-10-CM

## 2021-05-02 DIAGNOSIS — F419 Anxiety disorder, unspecified: Secondary | ICD-10-CM

## 2021-05-07 ENCOUNTER — Encounter (INDEPENDENT_AMBULATORY_CARE_PROVIDER_SITE_OTHER): Payer: Self-pay | Admitting: Family Medicine

## 2021-05-07 ENCOUNTER — Other Ambulatory Visit: Payer: Self-pay

## 2021-05-07 ENCOUNTER — Ambulatory Visit (INDEPENDENT_AMBULATORY_CARE_PROVIDER_SITE_OTHER): Payer: No Typology Code available for payment source | Admitting: Family Medicine

## 2021-05-07 VITALS — BP 105/66 | HR 73 | Temp 98.3°F | Ht 63.0 in | Wt 133.0 lb

## 2021-05-07 DIAGNOSIS — K219 Gastro-esophageal reflux disease without esophagitis: Secondary | ICD-10-CM

## 2021-05-07 DIAGNOSIS — E7849 Other hyperlipidemia: Secondary | ICD-10-CM

## 2021-05-07 DIAGNOSIS — Z9189 Other specified personal risk factors, not elsewhere classified: Secondary | ICD-10-CM | POA: Diagnosis not present

## 2021-05-07 DIAGNOSIS — E669 Obesity, unspecified: Secondary | ICD-10-CM

## 2021-05-07 DIAGNOSIS — R632 Polyphagia: Secondary | ICD-10-CM | POA: Diagnosis not present

## 2021-05-07 DIAGNOSIS — Z6833 Body mass index (BMI) 33.0-33.9, adult: Secondary | ICD-10-CM

## 2021-05-07 MED ORDER — WEGOVY 1.7 MG/0.75ML ~~LOC~~ SOAJ: 1.7000 mg | SUBCUTANEOUS | 0 refills | Status: DC

## 2021-05-11 ENCOUNTER — Other Ambulatory Visit: Payer: Self-pay

## 2021-05-11 ENCOUNTER — Ambulatory Visit
Admission: RE | Admit: 2021-05-11 | Discharge: 2021-05-11 | Disposition: A | Discharge status: Home / Self Care | Payer: No Typology Code available for payment source | Source: Ambulatory Visit

## 2021-05-11 DIAGNOSIS — Z1231 Encounter for screening mammogram for malignant neoplasm of breast: Secondary | ICD-10-CM

## 2021-05-15 NOTE — Progress Notes (Signed)
Chief Complaint:   OBESITY Angela Mcconnell is here to discuss her progress with her obesity treatment plan along with follow-up of her obesity related diagnoses.   Today's visit was #: 44 Starting weight: 186 lbs Starting date: 06/01/2020 Today's weight: 133 lbs Today's date: 05/07/2021 Weight change since last visit: 0 Total lbs lost to date: 53 lbs Body mass index is 23.56 kg/m.  Total weight loss percentage to date: -28.49%  Interim History: Angela Mcconnell is maintaining as discussed.  She has started strength training. Current Meal Plan: keeping a food journal and adhering to recommended goals of 1400 calories and 95 grams of protein for 100% of the time.  Current Exercise Plan: Strength training/walking for 30-60 minutes 5 times per week. Current Anti-Obesity Medications: Wegovy 1 mg subcutaneously weekly. Side effects: None.  Assessment/Plan:   Meds ordered this encounter  Medications   Semaglutide-Weight Management (WEGOVY) 1.7 MG/0.75ML SOAJ    Sig: Inject 1.7 mg into the skin once a week.    Dispense:  9 mL    Refill:  0    1. Polyphagia Not at goal. Current treatment: Wegovy 1 mg subcutaneously weekly. Polyphagia refers to excessive feelings of hunger. She will continue to focus on protein-rich, low simple carbohydrate foods. We reviewed the importance of hydration, regular exercise for stress reduction, and restorative sleep.  Plan:  Wegovy 1 mg is not available.  Will increase Wegovy to 1.7 mg weekly.  If too strong, consider changing to [place].  - Increase Semaglutide-Weight Management (WEGOVY) 1.7 MG/0.75ML SOAJ; Inject 1.7 mg into the skin once a week.  Dispense: 9 mL; Refill: 0  2. Other hyperlipidemia Course: Not at goal. Lipid-lowering medications: None.   Plan: Dietary changes: Increase soluble fiber, decrease simple carbohydrates, decrease saturated fat. Exercise changes: Moderate to vigorous-intensity aerobic activity 150 minutes per week or as tolerated. We will  continue to monitor along with PCP/specialists as it pertains to her weight loss journey.  Lab Results  Component Value Date   CHOL 226 (H) 06/01/2020   HDL 59 06/01/2020   LDLCALC 147 (H) 06/01/2020   TRIG 111 06/01/2020   CHOLHDL 3.3 02/07/2020   Lab Results  Component Value Date   ALT 14 06/01/2020   AST 20 06/01/2020   ALKPHOS 104 06/01/2020   BILITOT 0.4 06/01/2020   The 10-year ASCVD risk score Mikey Bussing DC Jr., et al., 2013) is: 2.5%   Values used to calculate the score:     Age: 60 years     Sex: Female     Is Non-Hispanic African American: Yes     Diabetic: No     Tobacco smoker: No     Systolic Blood Pressure: 478 mmHg     Is BP treated: No     HDL Cholesterol: 59 mg/dL     Total Cholesterol: 226 mg/dL  3. Gastroesophageal reflux disease, unspecified whether esophagitis present Angela Mcconnell is taking Protonix 40 mg daily.   Plan:  Continue Protonix.  We reviewed the diagnosis of GERD and importance of treatment. We discussed "red flag" symptoms and the importance of follow up if symptoms persisted despite treatment. We reviewed non-pharmacologic management of GERD symptoms: including: caffeine reduction, dietary changes, elevate HOB, NPO after supper, reduction of alcohol intake, tobacco cessation, and weight loss.  4. At risk for heart disease Due to Angela Mcconnell's current state of health and medical condition(s), she is at a higher risk for heart disease.  This puts the patient at much greater risk to subsequently  develop cardiopulmonary conditions that can significantly affect patient's quality of life in a negative manner.    At least 8 minutes were spent on counseling Angela Mcconnell about these concerns today. Evidence-based interventions for health behavior change were utilized today including the discussion of self monitoring techniques, problem-solving barriers, and SMART goal setting techniques.  Specifically, regarding patient's less desirable eating habits and patterns, we  employed the technique of small changes when Angela Mcconnell has not been able to fully commit to her prudent nutritional plan.  5. Obesity, current BMI 23.7  Course: Angela Mcconnell is currently in the action stage of change. As such, her goal is to continue with weight loss efforts.   Nutrition goals: She has agreed to keeping a food journal and adhering to recommended goals of 1400 calories and 95 grams of protein.   Exercise goals: For substantial health benefits, adults should do at least 150 minutes (2 hours and 30 minutes) a week of moderate-intensity, or 75 minutes (1 hour and 15 minutes) a week of vigorous-intensity aerobic physical activity, or an equivalent combination of moderate- and vigorous-intensity aerobic activity. Aerobic activity should be performed in episodes of at least 10 minutes, and preferably, it should be spread throughout the week.  Behavioral modification strategies: increasing lean protein intake, decreasing simple carbohydrates, increasing vegetables, and increasing water intake.  Angela Mcconnell has agreed to follow-up with our clinic in 6-8 weeks. She was informed of the importance of frequent follow-up visits to maximize her success with intensive lifestyle modifications for her multiple health conditions.   Objective:   Blood pressure 105/66, pulse 73, temperature 98.3 F (36.8 C), temperature source Oral, height 5\' 3"  (1.6 m), weight 133 lb (60.3 kg), SpO2 98 %. Body mass index is 23.56 kg/m.  General: Cooperative, alert, well developed, in no acute distress. HEENT: Conjunctivae and lids unremarkable. Cardiovascular: Regular rhythm.  Lungs: Normal work of breathing. Neurologic: No focal deficits.   Lab Results  Component Value Date   CREATININE 0.85 06/01/2020   BUN 10 06/01/2020   NA 141 06/01/2020   K 4.2 06/01/2020   CL 103 06/01/2020   CO2 24 06/01/2020   Lab Results  Component Value Date   ALT 14 06/01/2020   AST 20 06/01/2020   ALKPHOS 104 06/01/2020    BILITOT 0.4 06/01/2020   Lab Results  Component Value Date   HGBA1C 5.4 06/01/2020   Lab Results  Component Value Date   INSULIN 10.8 06/01/2020   Lab Results  Component Value Date   TSH 1.070 06/01/2020   Lab Results  Component Value Date   CHOL 226 (H) 06/01/2020   HDL 59 06/01/2020   LDLCALC 147 (H) 06/01/2020   TRIG 111 06/01/2020   CHOLHDL 3.3 02/07/2020   Lab Results  Component Value Date   WBC 8.6 06/01/2020   HGB 12.8 06/01/2020   HCT 40.1 06/01/2020   MCV 94 06/01/2020   PLT 319 06/01/2020   Lab Results  Component Value Date   IRON 95 06/01/2020   TIBC 315 06/01/2020   FERRITIN 228 (H) 06/01/2020   Attestation Statements:   Reviewed by clinician on day of visit: allergies, medications, problem list, medical history, surgical history, family history, social history, and previous encounter notes.  I, Water quality scientist, CMA, am acting as transcriptionist for Briscoe Deutscher, DO  I have reviewed the above documentation for accuracy and completeness, and I agree with the above. Briscoe Deutscher, DO

## 2021-05-16 ENCOUNTER — Other Ambulatory Visit: Payer: Self-pay | Admitting: Family Medicine

## 2021-05-16 DIAGNOSIS — F419 Anxiety disorder, unspecified: Secondary | ICD-10-CM

## 2021-05-16 DIAGNOSIS — G4709 Other insomnia: Secondary | ICD-10-CM

## 2021-05-16 NOTE — Telephone Encounter (Signed)
Requested medications are due for refill today.  yes  Requested medications are on the active medications list.  yes  Last refill. 05/02/2021  Future visit scheduled.   no  Notes to clinic.  Patient has not been seen in over 1 year. Please advise.

## 2021-05-19 ENCOUNTER — Other Ambulatory Visit: Payer: Self-pay | Admitting: Family Medicine

## 2021-05-19 NOTE — Telephone Encounter (Signed)
Dc'd 08/23/20 Completed course Marisa Sprinkles CMA

## 2021-06-18 ENCOUNTER — Other Ambulatory Visit: Payer: Self-pay

## 2021-06-18 ENCOUNTER — Ambulatory Visit (INDEPENDENT_AMBULATORY_CARE_PROVIDER_SITE_OTHER): Payer: No Typology Code available for payment source | Admitting: Family Medicine

## 2021-06-18 ENCOUNTER — Encounter (INDEPENDENT_AMBULATORY_CARE_PROVIDER_SITE_OTHER): Payer: Self-pay | Admitting: Family Medicine

## 2021-06-18 VITALS — BP 112/72 | HR 79 | Temp 98.1°F | Ht 63.0 in | Wt 128.0 lb

## 2021-06-18 DIAGNOSIS — E669 Obesity, unspecified: Secondary | ICD-10-CM

## 2021-06-18 DIAGNOSIS — Z6832 Body mass index (BMI) 32.0-32.9, adult: Secondary | ICD-10-CM | POA: Diagnosis not present

## 2021-06-18 DIAGNOSIS — R632 Polyphagia: Secondary | ICD-10-CM | POA: Diagnosis not present

## 2021-06-18 DIAGNOSIS — Z9189 Other specified personal risk factors, not elsewhere classified: Secondary | ICD-10-CM

## 2021-06-18 MED ORDER — WEGOVY 1.7 MG/0.75ML ~~LOC~~ SOAJ: 1.7000 mg | SUBCUTANEOUS | 0 refills | Status: DC

## 2021-06-27 NOTE — Progress Notes (Signed)
Chief Complaint:   OBESITY Angela Mcconnell is here to discuss her progress with her obesity treatment plan along with follow-up of her obesity related diagnoses.   Today's visit was #: 14 Starting weight: 186 lbs Starting date: 06/01/2020 Today's weight: 128 lbs Today's date: 06/18/2021 Weight change since last visit: 5 lbs Total lbs lost to date: 58 lbs Body mass index is 22.67 kg/m.  Total weight loss percentage to date: -31.18%  Interim History:  Angela Mcconnell is here for a follow up office visit.  We reviewed her meal plan and questions were answered.  Patient's food recall appears to be accurate and consistent with what is on plan when she is following it.   When eating on plan, her hunger and cravings are well controlled.    This is Angela Mcconnell's first office visit with me.  She saw Angela Mcconnell on 05/07/2021 and Angela Mcconnell was increased at that time.  Increased activity with grandchildren lately in addition to regular exercise.  Plan:  Goal is to maintain her weight at this time and improve physical fitness and wellness.  Current Meal Plan: keeping a food journal and adhering to recommended goals of 1400 calories and 95 grams of protein for 100% of Angela time.  Current Exercise Plan: Walking, weight training for 30 minutes 7 times per week. Current Anti-Obesity Medications: Wegovy 1.7 mg subcutaneously weekly. Side effects: None.  Assessment/Plan:   Medications Discontinued During This Encounter  Medication Reason   Semaglutide-Weight Management (WEGOVY) 1.7 MG/0.75ML SOAJ Reorder   Meds ordered this encounter  Medications   Semaglutide-Weight Management (WEGOVY) 1.7 MG/0.75ML SOAJ    Sig: Inject 1.7 mg into Angela skin once a week.    Dispense:  9 mL    Refill:  0   1. Polyphagia Controlled. Current treatment: Wegovy 1.7 mg subcutaneously weekly. Polyphagia refers to excessive feelings of hunger. She will continue to focus on protein-rich, low simple carbohydrate foods. We  reviewed Angela importance of hydration, regular exercise for stress reduction, and restorative sleep.  Tolerated increased dose well (from last office visit with Angela Mcconnell).  Forcing self to eat for Angela first couple of days.  Plan:  Refill Wegovy at same dose.  - Refill Semaglutide-Weight Management (WEGOVY) 1.7 MG/0.75ML SOAJ; Inject 1.7 mg into Angela skin once a week.  Dispense: 9 mL; Refill: 0  2. At risk for malnutrition Angela Mcconnell was given extensive malnutrition prevention education and counseling today of more than 9 minutes.  Counseled her that malnutrition refers to inappropriate nutrients or not Angela right balance of nutrients for optimal health.  Discussed with Angela Mcconnell that it is absolutely possible to be malnourished but yet obese.  Risk factors, including but not limited to, inappropriate dietary choices, difficulty with obtaining food due to physical or financial limitations, and various physical and mental health conditions were reviewed with Angela Mcconnell.   3. Obesity with current BMI 22.7  Course: Angela Mcconnell is currently in Angela action stage of change. As such, her goal is to continue with weight loss efforts.   Nutrition goals: She has agreed to keeping a food journal and adhering to recommended goals of 1400 calories and 95 grams of protein.   Exercise goals:  As is.  Behavioral modification strategies: increasing lean protein intake, increasing water intake, and planning for success.  Angela Mcconnell has agreed to follow-up with our clinic in 6 weeks with Angela Mcconnell. She was informed of Angela importance of frequent follow-up visits to maximize her success  with intensive lifestyle modifications for her multiple health conditions.   Objective:   Blood pressure 112/72, pulse 79, temperature 98.1 F (36.7 C), height '5\' 3"'$  (1.6 m), weight 128 lb (58.1 kg), SpO2 99 %. Body mass index is 22.67 kg/m.  General: Cooperative, alert, well developed, in no acute  distress. HEENT: Conjunctivae and lids unremarkable. Cardiovascular: Regular rhythm.  Lungs: Normal work of breathing. Neurologic: No focal deficits.   Lab Results  Component Value Date   CREATININE 0.85 06/01/2020   BUN 10 06/01/2020   NA 141 06/01/2020   K 4.2 06/01/2020   CL 103 06/01/2020   CO2 24 06/01/2020   Lab Results  Component Value Date   ALT 14 06/01/2020   AST 20 06/01/2020   ALKPHOS 104 06/01/2020   BILITOT 0.4 06/01/2020   Lab Results  Component Value Date   HGBA1C 5.4 06/01/2020   Lab Results  Component Value Date   INSULIN 10.8 06/01/2020   Lab Results  Component Value Date   TSH 1.070 06/01/2020   Lab Results  Component Value Date   CHOL 226 (H) 06/01/2020   HDL 59 06/01/2020   LDLCALC 147 (H) 06/01/2020   TRIG 111 06/01/2020   CHOLHDL 3.3 02/07/2020   Lab Results  Component Value Date   VD25OH 39.6 06/01/2020   Lab Results  Component Value Date   WBC 8.6 06/01/2020   HGB 12.8 06/01/2020   HCT 40.1 06/01/2020   MCV 94 06/01/2020   PLT 319 06/01/2020   Lab Results  Component Value Date   IRON 95 06/01/2020   TIBC 315 06/01/2020   FERRITIN 228 (H) 06/01/2020   Attestation Statements:   Reviewed by clinician on day of visit: allergies, medications, problem list, medical history, surgical history, family history, social history, and previous encounter notes.  I, Water quality scientist, CMA, am acting as Location manager for [employer], DO.  I have reviewed Angela above documentation for accuracy and completeness, and I agree with Angela above. Marjory Sneddon, D.O.  [place] was signed into law in 2016 which includes Angela topic of electronic health records.  This provides immediate access to information in MyChart.  This includes consultation notes, operative notes, office notes, lab results and pathology reports.  If you have any questions about what you read please let us know at your next visit so we can discuss your  concerns and take corrective action if need be.  We are right here with you.

## 2021-07-04 ENCOUNTER — Ambulatory Visit: Payer: Self-pay | Admitting: *Deleted

## 2021-07-04 NOTE — Telephone Encounter (Signed)
Left shoulder and back with knots causing muscle spasms with shoulder and the neck for one week. Spasms shooting down left arm with intermittent finger tingling started yesterday. Denies CP/SOB. Had massage, using heat and taking ibuprofen with little improvement.    Reason for Disposition  [1] MODERATE back pain (e.g., interferes with normal activities) AND [2] present > 3 days  Answer Assessment - Initial Assessment Questions 1. ONSET: "When did the pain begin?"      One week ago 2. LOCATION: "Where does it hurt?" (upper, mid or lower back)     Shoulder to the back and down the arm 3. SEVERITY: "How bad is the pain?"  (e.g., Scale 1-10; mild, moderate, or severe)   - MILD (1-3): doesn't interfere with normal activities    - MODERATE (4-7): interferes with normal activities or awakens from sleep    - SEVERE (8-10): excruciating pain, unable to do any normal activities      6 4. PATTERN: "Is the pain constant?" (e.g., yes, no; constant, intermittent)      intermittently 5. RADIATION: "Does the pain shoot into your legs or elsewhere?"     Arm and hand 6. CAUSE:  "What do you think is causing the back pain?"      Nothing in particular-has had this before 7. BACK OVERUSE:  "Any recent lifting of heavy objects, strenuous work or exercise?"     no 8. MEDICATIONS: "What have you taken so far for the pain?" (e.g., nothing, acetaminophen, NSAIDS)     ibuprofen 9. NEUROLOGIC SYMPTOMS: "Do you have any weakness, numbness, or problems with bowel/bladder control?"     no 10. OTHER SYMPTOMS: "Do you have any other symptoms?" (e.g., fever, abdominal pain, burning with urination, blood in urine)       no 11. PREGNANCY: "Is there any chance you are pregnant?" (e.g., yes, no; LMP)      Na  Protocols used: Back Pain-A-AH

## 2021-07-05 NOTE — Telephone Encounter (Signed)
Noted. Patient has an appt scheduled in August.

## 2021-07-10 ENCOUNTER — Other Ambulatory Visit: Payer: Self-pay | Admitting: Family Medicine

## 2021-07-10 DIAGNOSIS — Z91018 Allergy to other foods: Secondary | ICD-10-CM

## 2021-07-10 NOTE — Telephone Encounter (Signed)
Requested medications are due for refill today.  yes  Requested medications are on the active medications list.  yes  Last refill. 05/10/2020  Future visit scheduled.   yes  Notes to clinic.  Prescription is expired.

## 2021-07-30 ENCOUNTER — Ambulatory Visit: Payer: No Typology Code available for payment source | Admitting: Family Medicine

## 2021-07-31 ENCOUNTER — Ambulatory Visit (INDEPENDENT_AMBULATORY_CARE_PROVIDER_SITE_OTHER): Payer: No Typology Code available for payment source | Admitting: Family Medicine

## 2021-08-08 ENCOUNTER — Other Ambulatory Visit: Payer: Self-pay

## 2021-08-08 ENCOUNTER — Ambulatory Visit (INDEPENDENT_AMBULATORY_CARE_PROVIDER_SITE_OTHER): Payer: No Typology Code available for payment source | Admitting: Physician Assistant

## 2021-08-08 ENCOUNTER — Encounter (INDEPENDENT_AMBULATORY_CARE_PROVIDER_SITE_OTHER): Payer: Self-pay | Admitting: Physician Assistant

## 2021-08-08 VITALS — BP 123/75 | HR 81 | Temp 98.0°F | Ht 63.0 in | Wt 129.0 lb

## 2021-08-08 DIAGNOSIS — E7849 Other hyperlipidemia: Secondary | ICD-10-CM | POA: Diagnosis not present

## 2021-08-08 DIAGNOSIS — Z6832 Body mass index (BMI) 32.0-32.9, adult: Secondary | ICD-10-CM | POA: Diagnosis not present

## 2021-08-08 DIAGNOSIS — E669 Obesity, unspecified: Secondary | ICD-10-CM

## 2021-08-08 DIAGNOSIS — R632 Polyphagia: Secondary | ICD-10-CM

## 2021-08-08 DIAGNOSIS — Z9189 Other specified personal risk factors, not elsewhere classified: Secondary | ICD-10-CM

## 2021-08-08 MED ORDER — WEGOVY 1.7 MG/0.75ML ~~LOC~~ SOAJ: 1.7000 mg | SUBCUTANEOUS | 0 refills | Status: DC

## 2021-08-08 NOTE — Progress Notes (Signed)
Chief Complaint:   OBESITY Angela Mcconnell is here to discuss her progress with her obesity treatment plan along with follow-up of her obesity related diagnoses. Angela Mcconnell is on keeping a food journal and adhering to recommended goals of 1400 calories and 95 grams of protein daily and states she is following her eating plan approximately 0% of the time. Angela Mcconnell states she is walking for 30 minutes 3 times per week.  Today's visit was #: 15 Starting weight: 186 lbs Starting date: 06/01/2020 Today's weight: 129 lbs Today's date: 08/08/2021 Total lbs lost to date: 31 Total lbs lost since last in-office visit: 0  Interim History: Angela Mcconnell is doing well with the plan. She is currently in a maintenance stage. She notes that she is taking Wegovy q 10 days and it helps with her appetite.  Subjective:   1. Other hyperlipidemia Angela Mcconnell is not on medications. Her last lipid panel was not at goal. She is due for labs soon.  2. Angela Mcconnell is on Wegovy, and she notes decreased appetite. She denies nausea or vomiting.  3. At risk for heart disease Angela Mcconnell is at a higher than average risk for cardiovascular disease due to obesity.   Assessment/Plan:   1. Other hyperlipidemia Cardiovascular risk and specific lipid/LDL goals reviewed. We discussed several lifestyle modifications today. We will recheck labs at her next visit. Angela Mcconnell will continue her meal plan, and will continue to work on diet, exercise and weight loss efforts. Orders and follow up as documented in patient record.   2. Polyphagia Intensive lifestyle modifications are the first line treatment for this issue. We discussed several lifestyle modifications today. Angela Mcconnell will continue to work on diet, exercise and weight loss efforts. We will refill Wegovy for 1 month. Orders and follow up as documented in patient record.  - Semaglutide-Weight Management (WEGOVY) 1.7 MG/0.75ML SOAJ; Inject 1.7 mg into the skin once a week.  Dispense: 9  mL; Refill: 0  3. At risk for heart disease Angela Mcconnell was given approximately 15 minutes of coronary artery disease prevention counseling today. She is 60 y.o. female and has risk factors for heart disease including obesity. We discussed intensive lifestyle modifications today with an emphasis on specific weight loss instructions and strategies.   Repetitive spaced learning was employed today to elicit superior memory formation and behavioral change.  4. Obesity with current BMI 22.86 Angela Mcconnell is currently in the action stage of change. As such, her goal is to maintain weight for now. She has agreed to keeping a food journal and adhering to recommended goals of 1400 calories and 95 grams of protein daily.   We will recheck fasting labs at her next visit.  Exercise goals: As is, add strength training 2 days a week and progress to 4 days a week.  Behavioral modification strategies: meal planning and cooking strategies and planning for success.  Angela Mcconnell has agreed to follow-up with our clinic in 4 weeks. She was informed of the importance of frequent follow-up visits to maximize her success with intensive lifestyle modifications for her multiple health conditions.   Objective:   Blood pressure 123/75, pulse 81, temperature 98 F (36.7 C), height '5\' 3"'$  (1.6 m), weight 129 lb (58.5 kg), SpO2 98 %. Body mass index is 22.85 kg/m.  General: Cooperative, alert, well developed, in no acute distress. HEENT: Conjunctivae and lids unremarkable. Cardiovascular: Regular rhythm.  Lungs: Normal work of breathing. Neurologic: No focal deficits.   Lab Results  Component Value Date   CREATININE 0.85  06/01/2020   BUN 10 06/01/2020   NA 141 06/01/2020   K 4.2 06/01/2020   CL 103 06/01/2020   CO2 24 06/01/2020   Lab Results  Component Value Date   ALT 14 06/01/2020   AST 20 06/01/2020   ALKPHOS 104 06/01/2020   BILITOT 0.4 06/01/2020   Lab Results  Component Value Date   HGBA1C 5.4 06/01/2020    Lab Results  Component Value Date   INSULIN 10.8 06/01/2020   Lab Results  Component Value Date   TSH 1.070 06/01/2020   Lab Results  Component Value Date   CHOL 226 (H) 06/01/2020   HDL 59 06/01/2020   LDLCALC 147 (H) 06/01/2020   TRIG 111 06/01/2020   CHOLHDL 3.3 02/07/2020   Lab Results  Component Value Date   VD25OH 39.6 06/01/2020   Lab Results  Component Value Date   WBC 8.6 06/01/2020   HGB 12.8 06/01/2020   HCT 40.1 06/01/2020   MCV 94 06/01/2020   PLT 319 06/01/2020   Lab Results  Component Value Date   IRON 95 06/01/2020   TIBC 315 06/01/2020   FERRITIN 228 (H) 06/01/2020   Attestation Statements:   Reviewed by clinician on day of visit: allergies, medications, problem list, medical history, surgical history, family history, social history, and previous encounter notes.   Wilhemena Durie, am acting as transcriptionist for [employer], PA-C.  I have reviewed the above documentation for accuracy and completeness, and I agree with the above. Abby Potash, PA-C

## 2021-08-28 ENCOUNTER — Ambulatory Visit: Payer: No Typology Code available for payment source | Admitting: Obstetrics and Gynecology

## 2021-09-14 ENCOUNTER — Other Ambulatory Visit: Payer: Self-pay

## 2021-09-14 ENCOUNTER — Encounter: Payer: Self-pay | Admitting: Obstetrics and Gynecology

## 2021-09-14 ENCOUNTER — Ambulatory Visit (INDEPENDENT_AMBULATORY_CARE_PROVIDER_SITE_OTHER): Payer: No Typology Code available for payment source | Admitting: Obstetrics and Gynecology

## 2021-09-14 VITALS — BP 122/80 | HR 83 | Ht 63.0 in | Wt 130.0 lb

## 2021-09-14 DIAGNOSIS — Z01419 Encounter for gynecological examination (general) (routine) without abnormal findings: Secondary | ICD-10-CM | POA: Diagnosis not present

## 2021-09-14 NOTE — Patient Instructions (Signed)

## 2021-09-14 NOTE — Progress Notes (Signed)
60 y.o. G2P0000 Significant Other African American female here for annual exam.    No hot flashes.   Had BSO 9 - 10 years ago.  Did estrogen patch for a short period of time.   Lost 70 pounds through [facility] Weight [facility].  Doing maintenance care now.  PCP:   Charlott Rakes, MD  No LMP recorded. Patient has had a hysterectomy.           Sexually active: No.  The current method of family planning is none.    Exercising: Yes.    Home exercise routine includes treadmill and walking 1 hrs per day. Smoker:  no  Health Maintenance: Pap:  2010, normal at time of Hyst History of abnormal Pap:  yes MMG:  05/2021 - Bi-RADS1.  Colonoscopy:  8 years ago - normal  BMD:   n/a  Result   TDaP:  2022 Gardasil:   no HIV:  02/07/20 neg Hep C: 02/07/20 neg Screening Labs:  PCP Flu vaccine:  not done.  She does not due to egg allergy.  She will do at pharmacy.  Covid booster:  scheduled to do her bivalent booster.    reports that she has never smoked. She has never used smokeless tobacco. She reports that she does not drink alcohol and does not use drugs.  Past Medical History:  Diagnosis Date   Anemia    Arthritis    Asthma    Blood transfusion without reported diagnosis 11/2018   Chest pain    Depression    Fibroid    GERD (gastroesophageal reflux disease)    Insomnia    Joint pain    Multiple food allergies    Osteoarthritis    Palpitations    Shortness of breath    Vitamin D deficiency     Past Surgical History:  Procedure Laterality Date   ABDOMINAL HYSTERECTOMY     TLH   CORONARY ANGIOGRAM  Nov 2010   Normal coronary arteries   ESOPHAGEAL MANOMETRY N/A 08/12/2018   Procedure: ESOPHAGEAL MANOMETRY (EM);  Surgeon: Clarene Essex, MD;  Location: [facility];  Service: Endoscopy;  Laterality: N/A;   HERNIA REPAIR     INSERTION OF MESH N/A 10/14/2018   Procedure: INSERTION OF MESH;  Surgeon: Michael Boston, MD;  Location: [facility];  Service: General;  Laterality: N/A;   KNEE  SURGERY     OOPHORECTOMY      Current Outpatient Medications  Medication Sig Dispense Refill   Cholecalciferol (VITAMIN D3) 50 MCG (2000 UT) TABS Take by mouth daily.      EPINEPHrine (AUVI-Q) 0.3 mg/0.3 mL IJ SOAJ injection Inject 0.3 mLs (0.3 mg total) into the muscle as needed for anaphylaxis. 1 each 2   hydrOXYzine (ATARAX/VISTARIL) 25 MG tablet TAKE 1 TABLET BY MOUTH AT BEDTIME AS NEEDED. 30 tablet 0   Multiple Vitamin (MULTIVITAMIN) tablet Take 2 tablets by mouth daily.     Multiple Vitamins-Minerals (AIRBORNE PO) Take by mouth.     OVER THE COUNTER MEDICATION Goli Vitamin     pantoprazole (PROTONIX) 40 MG tablet Take 1 tablet (40 mg total) by mouth daily. (Patient not taking: Reported on 09/14/2021) 30 tablet 1   No current facility-administered medications for this visit.    Family History  Problem Relation Age of Onset   Cancer Sister    Thyroid disease Sister        Thyroidectomy   Hypertension Mother    Heart disease Mother    Stroke Mother  Obesity Mother    Cancer Father    Hypertension Father    Heart attack Brother 36   Ovarian cancer Sister 69    Review of Systems  Joints feel better.  No more reflux.   Exam:   BP 122/80 (BP Location: Right Arm, Patient Position: Sitting, Cuff Size: Normal)   Pulse 83   Ht 5\' 3"  (1.6 m)   Wt 130 lb (59 kg)   SpO2 99%   BMI 23.03 kg/m     General appearance: alert, cooperative and appears stated age Head: normocephalic, without obvious abnormality, atraumatic Neck: no adenopathy, supple, symmetrical, trachea midline and thyroid normal to inspection and palpation Lungs: clear to auscultation bilaterally Breasts: normal appearance, no masses or tenderness, No nipple retraction or dimpling, No nipple discharge or bleeding, No axillary adenopathy Heart: regular rate and rhythm Abdomen: soft, non-tender; no masses, no organomegaly Extremities: extremities normal, atraumatic, no cyanosis or edema Skin: skin color, texture,  turgor normal. No rashes or lesions Lymph nodes: cervical, supraclavicular, and axillary nodes normal. Neurologic: grossly normal  Pelvic: External genitalia:  no lesions              No abnormal inguinal nodes palpated.              Urethra:  normal appearing urethra with no masses, tenderness or lesions              Bartholins and Skenes: normal                 Vagina: normal appearing vagina with normal color and discharge, no lesions              Cervix: absent              Pap taken: no Bimanual Exam:  Uterus:  normal size, contour, position, consistency, mobility, non-tender              Adnexa: no mass, fullness, tenderness              Rectal exam: yes.  Confirms.              Anus:  normal sphincter tone, no lesions  Chaperone was present for exam:  Kimalexis, CMA  Assessment:   Well woman visit with gynecologic exam. Status post hysterectomy.  Status post laparoscopic BSO and LOA. Successful weight loss.   Plan: Mammogram screening discussed. Self breast awareness reviewed. Pap and HR HPV as above. Guidelines for Calcium, Vitamin D, regular exercise program including cardiovascular and weight bearing exercise. BMD age 68.  Labs through Weight loss group.  Follow up annually and prn.   After visit summary provided.

## 2021-09-18 ENCOUNTER — Other Ambulatory Visit: Payer: Self-pay

## 2021-09-18 ENCOUNTER — Ambulatory Visit (INDEPENDENT_AMBULATORY_CARE_PROVIDER_SITE_OTHER): Payer: No Typology Code available for payment source | Admitting: Family Medicine

## 2021-09-18 ENCOUNTER — Encounter (INDEPENDENT_AMBULATORY_CARE_PROVIDER_SITE_OTHER): Payer: Self-pay | Admitting: Family Medicine

## 2021-09-18 VITALS — BP 110/70 | HR 70 | Temp 98.0°F | Ht 63.0 in | Wt 127.0 lb

## 2021-09-18 DIAGNOSIS — R632 Polyphagia: Secondary | ICD-10-CM | POA: Diagnosis not present

## 2021-09-18 DIAGNOSIS — E669 Obesity, unspecified: Secondary | ICD-10-CM | POA: Diagnosis not present

## 2021-09-18 DIAGNOSIS — Z6832 Body mass index (BMI) 32.0-32.9, adult: Secondary | ICD-10-CM | POA: Diagnosis not present

## 2021-09-18 MED ORDER — WEGOVY 1.7 MG/0.75ML ~~LOC~~ SOAJ: 1.7000 mg | SUBCUTANEOUS | 0 refills | Status: DC

## 2021-09-20 NOTE — Progress Notes (Signed)
Chief Complaint:   OBESITY Angela Mcconnell is here to discuss her progress with her obesity treatment plan along with follow-up of her obesity related diagnoses. See [facility] Weight [facility] Flowsheet for complete bioelectrical impedance results.  Today's visit was #: 55 Starting weight: 186 lbs Starting date: 06/01/2020 Weight change since last visit: 2 lbs Total lbs lost to date: 59 lbs Total weight loss percentage to date: -31.72%  Nutrition Plan: Keeping a food journal and adhering to recommended goals of 1400 calories and 95 grams of protein daily for 100% of the time. Activity: Walking/strength training for 60-80 minutes 7 times per week. Anti-obesity medications: Wegovy 1.7 mg subcutaneously weekly. Reported side effects: None.  Interim History: [name] had a CPE with labs at her PCP's office a few months ago.  She is taking Wegovy every 10 days.  Assessment/Plan:   1. Polyphagia Controlled. Current treatment: Wegovy 1.7 mg subcutaneously weekly.    Plan:  Continue [id number].  Will refill today, as per below.  She will continue to focus on protein-rich, low simple carbohydrate foods. We reviewed the importance of hydration, regular exercise for stress reduction, and restorative sleep.  - Refill Semaglutide-Weight Management (WEGOVY) 1.7 MG/0.75ML SOAJ; Inject 1.7 mg into the skin once a week.  Dispense: 9 mL; Refill: 0  2. Obesity, current BMI 22.5  Course: [name] is currently in the action stage of change. As such, her goal is to continue with weight loss efforts.   Nutrition goals: She has agreed to keeping a food journal and adhering to recommended goals of 1400 calories and 95 grams of protein.   Exercise goals:  As is.  Behavioral modification strategies: increasing lean protein intake, decreasing simple carbohydrates, increasing vegetables, and increasing water intake.  [name] has agreed to follow-up with our clinic in 3 months. She was informed of the importance of  frequent follow-up visits to maximize her success with intensive lifestyle modifications for her multiple health conditions.   Objective:   Blood pressure 110/70, pulse 70, temperature 98 F (36.7 C), temperature source Oral, height 5\' 3"  (1.6 m), weight 127 lb (57.6 kg), SpO2 97 %. Body mass index is 22.5 kg/m.  General: Cooperative, alert, well developed, in no acute distress. HEENT: Conjunctivae and lids unremarkable. Cardiovascular: Regular rhythm.  Lungs: Normal work of breathing. Neurologic: No focal deficits.   Lab Results  Component Value Date   CREATININE 0.85 06/01/2020   BUN 10 06/01/2020   NA 141 06/01/2020   K 4.2 06/01/2020   CL 103 06/01/2020   CO2 24 06/01/2020   Lab Results  Component Value Date   ALT 14 06/01/2020   AST 20 06/01/2020   ALKPHOS 104 06/01/2020   BILITOT 0.4 06/01/2020   Lab Results  Component Value Date   HGBA1C 5.4 06/01/2020   Lab Results  Component Value Date   INSULIN 10.8 06/01/2020   Lab Results  Component Value Date   TSH 1.070 06/01/2020   Lab Results  Component Value Date   CHOL 226 (H) 06/01/2020   HDL 59 06/01/2020   LDLCALC 147 (H) 06/01/2020   TRIG 111 06/01/2020   CHOLHDL 3.3 02/07/2020   Lab Results  Component Value Date   VD25OH 39.6 06/01/2020   Lab Results  Component Value Date   WBC 8.6 06/01/2020   HGB 12.8 06/01/2020   HCT 40.1 06/01/2020   MCV 94 06/01/2020   PLT 319 06/01/2020   Lab Results  Component Value Date   IRON 95 06/01/2020  TIBC 315 06/01/2020   FERRITIN 228 (H) 06/01/2020   Attestation Statements:   Reviewed by clinician on day of visit: allergies, medications, problem list, medical history, surgical history, family history, social history, and previous encounter notes.  I, Water quality scientist, CMA, am acting as transcriptionist for Briscoe Deutscher, DO  I have reviewed the above documentation for accuracy and completeness, and I agree with the above. -  Briscoe Deutscher, DO, MS, FAAFP,  [facility] - Family and Bariatric Medicine.

## 2021-11-12 ENCOUNTER — Other Ambulatory Visit: Payer: Self-pay

## 2021-11-12 ENCOUNTER — Ambulatory Visit
Admission: EM | Admit: 2021-11-12 | Discharge: 2021-11-12 | Disposition: A | Discharge status: Home / Self Care | Payer: No Typology Code available for payment source

## 2021-11-12 NOTE — ED Triage Notes (Signed)
Pt states she jammed her left thumb but is not having shooting pain from thumb to elbow. Patient is able to move her thumb but is not able to grab anything with her thumb.

## 2021-11-12 NOTE — ED Provider Notes (Signed)
  [facility]    CSN: [id number] Arrival date & time: 11/12/21  1505    HISTORY  No chief complaint on file.  HPI Angela Mcconnell is a 60 y.o. female. Pt states she jammed her left thumb but is not having shooting pain from thumb to elbow. Patient is able to move her thumb but is not able to grab anything with her thumb.   The history is provided by the patient.   Patient advised that we are unable to perform imaging at this clinic due to not having a radiology technician or functioning x-ray machine.  Patient states she was not advised at this information on check-in today.  Patient advised limited charge for this visit today.  Patient states she will go instead to the emergency room so that she can get the results of her x-ray back today.   Lynden Oxford Scales, [place] 11/12/21 [phone]

## 2021-11-13 ENCOUNTER — Ambulatory Visit (INDEPENDENT_AMBULATORY_CARE_PROVIDER_SITE_OTHER): Payer: No Typology Code available for payment source

## 2021-11-13 ENCOUNTER — Encounter (HOSPITAL_COMMUNITY): Payer: Self-pay | Admitting: Emergency Medicine

## 2021-11-13 ENCOUNTER — Ambulatory Visit (HOSPITAL_COMMUNITY)
Admission: EM | Admit: 2021-11-13 | Discharge: 2021-11-13 | Disposition: A | Discharge status: Home / Self Care | Payer: No Typology Code available for payment source | Attending: Family Medicine | Admitting: Family Medicine

## 2021-11-13 ENCOUNTER — Other Ambulatory Visit: Payer: Self-pay

## 2021-11-13 DIAGNOSIS — S63642A Sprain of metacarpophalangeal joint of left thumb, initial encounter: Secondary | ICD-10-CM

## 2021-11-13 DIAGNOSIS — M79645 Pain in left finger(s): Secondary | ICD-10-CM

## 2021-11-13 MED ORDER — NAPROXEN 375 MG PO TABS: 375.0000 mg | ORAL_TABLET | Freq: Two times a day (BID) | ORAL | 0 refills | Status: DC

## 2021-11-13 NOTE — ED Provider Notes (Signed)
[facility]    CSN: [id number] Arrival date & time: 11/13/21  0808      History   Chief Complaint Chief Complaint  Patient presents with   Finger Injury    HPI Angela Mcconnell is a 60 y.o. female.   Patient presents today with a 1 week history of left thumb pain following injury.  Reports that she was getting a pack of meat out of the freezer when it left her grip and she moved her hand downward causing her thumb to be jammed/impacted by another package of frozen meat.  Since that time she has had ongoing pain.  Reports pain is rated 6 on a 0-10 pain scale, localized to left thenar eminence with radiation into arm towards elbow, described as shooting, worse with pressure of her thenar eminence or attempted gripping items, relieving factors notified.  She is ambidextrous and does use her left hand for many tasks so is having difficulty with daily activities as result of symptoms.  She has tried ibuprofen and Tylenol without improvement of symptoms.  She does report occasional numbness but denies any paresthesias.  She reports normal range of motion of thumb and hand.    Past Medical History:  Diagnosis Date   Anemia    Arthritis    Asthma    Blood transfusion without reported diagnosis 11/2018   Chest pain    Depression    Fibroid    GERD (gastroesophageal reflux disease)    Insomnia    Joint pain    Multiple food allergies    Osteoarthritis    Palpitations    Shortness of breath    Vitamin D deficiency     Patient Active Problem List   Diagnosis Date Noted   Vitamin D deficiency 07/18/2020   Insulin resistance 07/03/2020   Allergic urticaria 01/11/2019   Food allergy 01/11/2019   Seasonal allergic rhinitis 01/11/2019   Allergic conjunctivitis 01/11/2019   Mild intermittent asthma 01/11/2019   Hiatal hernia with GERD and esophagitis s/p robotic repair & Nissen fundoplication 40/07/6760 95/08/3266   Status post Nissen fundoplication 12/45/8099  10/14/2018   Class 1 obesity with serious comorbidity and body mass index (BMI) of 32.0 to 32.9 in adult 12/05/2016   Elevated LDL cholesterol level 12/05/2016   RBBB 07/16/2013   Normal coronary arteries- Nov 2010 07/16/2013   Dyspnea on exertion 07/16/2013   Anemia 07/16/2013   Chest pain at rest 07/15/2013   PND (paroxysmal nocturnal dyspnea) 07/15/2013   Asthma     Past Surgical History:  Procedure Laterality Date   ABDOMINAL HYSTERECTOMY     TLH   CORONARY ANGIOGRAM  Nov 2010   Normal coronary arteries   ESOPHAGEAL MANOMETRY N/A 08/12/2018   Procedure: ESOPHAGEAL MANOMETRY (EM);  Surgeon: Clarene Essex, MD;  Location: [facility];  Service: Endoscopy;  Laterality: N/A;   HERNIA REPAIR     INSERTION OF MESH N/A 10/14/2018   Procedure: INSERTION OF MESH;  Surgeon: Michael Boston, MD;  Location: [facility];  Service: General;  Laterality: N/A;   KNEE SURGERY     OOPHORECTOMY      OB History     Gravida  2   Para  2   Term  0   Preterm  0   AB  0   Living  2      SAB  0   IAB  0   Ectopic  0   Multiple  0   Live Births  2  Home Medications    Prior to Admission medications   Medication Sig Start Date End Date Taking? Authorizing Provider  naproxen (NAPROSYN) 375 MG tablet Take 1 tablet (375 mg total) by mouth 2 (two) times daily. 11/13/21  Yes Caydin Yeatts, Derry Skill, PA-C  Cholecalciferol (VITAMIN D3) 50 MCG (2000 UT) TABS Take by mouth daily.     [provider]  EPINEPHrine (AUVI-Q) 0.3 mg/0.3 mL IJ SOAJ injection Inject 0.3 mLs (0.3 mg total) into the muscle as needed for anaphylaxis. 05/10/20   Charlott Rakes, MD  hydrOXYzine (ATARAX/VISTARIL) 25 MG tablet TAKE 1 TABLET BY MOUTH AT BEDTIME AS NEEDED. 05/02/21   Charlott Rakes, MD  Multiple Vitamin (MULTIVITAMIN) tablet Take 2 tablets by mouth daily.    [provider]  Multiple Vitamins-Minerals (AIRBORNE PO) Take by mouth.    [provider]  OVER THE COUNTER MEDICATION  Goli Vitamin    [provider]  pantoprazole (PROTONIX) 40 MG tablet Take 1 tablet (40 mg total) by mouth daily. 05/10/20   Charlott Rakes, MD  Semaglutide-Weight Management (WEGOVY) 1.7 MG/0.75ML SOAJ Inject 1.7 mg into the skin once a week. 09/18/21   Briscoe Deutscher, DO    Family History Family History  Problem Relation Age of Onset   Cancer Sister    Thyroid disease Sister        Thyroidectomy   Hypertension Mother    Heart disease Mother    Stroke Mother    Obesity Mother    Cancer Father    Hypertension Father    Heart attack Brother 41   Ovarian cancer Sister 29    Social History Social History   Tobacco Use   Smoking status: Never   Smokeless tobacco: Never  Vaping Use   Vaping Use: Never used  Substance Use Topics   Alcohol use: No   Drug use: No     Allergies   Sulfa antibiotics, Tape, Clotrimazole, Eggs or egg-derived products, Hydrocodone, and Shellfish allergy   Review of Systems Review of Systems  Constitutional:  Positive for activity change. Negative for appetite change, fatigue and fever.  Respiratory:  Negative for cough and shortness of breath.   Cardiovascular:  Negative for chest pain.  Musculoskeletal:  Positive for arthralgias. Negative for gait problem, joint swelling and myalgias.  Neurological:  Positive for numbness. Negative for dizziness, weakness, light-headedness and headaches.    Physical Exam Triage Vital Signs ED Triage Vitals  Enc Vitals Group     BP 11/13/21 0836 (!) 147/77     Pulse Rate 11/13/21 0836 67     Resp 11/13/21 0836 16     Temp 11/13/21 0836 98.1 F (36.7 C)     Temp Source 11/13/21 0836 Oral     SpO2 11/13/21 0836 98 %     Weight --      Height --      Head Circumference --      Peak Flow --      Pain Score 11/13/21 0835 4     Pain Loc --      Pain Edu? --      Excl. in [place]? --    No data found.  Updated Vital Signs BP (!) 147/77 (BP Location: Right Arm)   Pulse 67   Temp 98.1 F (36.7 C)  (Oral)   Resp 16   SpO2 98%   Visual Acuity Right Eye Distance:   Left Eye Distance:   Bilateral Distance:    Right Eye Near:  Left Eye Near:    Bilateral Near:     Physical Exam Vitals reviewed.  Constitutional:      General: She is awake. She is not in acute distress.    Appearance: Normal appearance. She is well-developed. She is not ill-appearing.     Comments: Very pleasant female appears stated age no acute distress sitting comfortably in exam room  HENT:     Head: Normocephalic and atraumatic.  Cardiovascular:     Rate and Rhythm: Normal rate and regular rhythm.     Pulses:          Radial pulses are 2+ on the right side and 2+ on the left side.     Heart sounds: Normal heart sounds, S1 normal and S2 normal. No murmur heard.    Comments: Unable to assess capillary refill due to acrylic nails.  Pulses intact Pulmonary:     Effort: Pulmonary effort is normal.     Breath sounds: Normal breath sounds. No wheezing, rhonchi or rales.  Musculoskeletal:     Left hand: Tenderness present. No swelling, deformity or bony tenderness. Normal range of motion. Decreased strength. Normal sensation. There is no disruption of two-point discrimination.     Comments: Left hand: Tenderness palpation over the first MCP and along left thenar eminence.  No deformity noted.  Normal active range of motion at thumb and wrist.  Hand neurovascularly intact.  Psychiatric:        Behavior: Behavior is cooperative.     UC Treatments / Results  Labs (all labs ordered are listed, but only abnormal results are displayed) Labs Reviewed - No data to display  EKG   Radiology DG Finger Thumb Left  Result Date: 11/13/2021 CLINICAL DATA:  Left thumb pain after injury 1 week ago EXAM: LEFT THUMB 2+V COMPARISON:  None. FINDINGS: There is no evidence of fracture or dislocation. There is no evidence of arthropathy or other focal bone abnormality. Soft tissues are unremarkable. IMPRESSION: Negative.  Electronically Signed   By: Davina Poke D.O.   On: 11/13/2021 09:12    Procedures Procedures (including critical care time)  Medications Ordered in UC Medications - No data to display  Initial Impression / Assessment and Plan / UC Course  I have reviewed the triage vital signs and the nursing notes.  Pertinent labs & imaging results that were available during my care of the patient were reviewed by me and considered in my medical decision making (see chart for details).     X-ray obtained given prolonged symptoms that showed no osseous abnormality.  Suspect sprain as etiology of symptoms.  Patient was placed in wrist brace with thumb abduction for comfort and support.  We will start Naprosyn twice daily with instruction not to take additional NSAIDs with this medication due to risk of GI bleeding.  Patient does have a history of GERD with so this medication can exacerbate the symptoms that she should use it for as limited time as possible.  Also discussed that she should not take additional NSAIDs this will increase the risk of GERD and GI symptoms.  He can use Tylenol for breakthrough pain.  Discussed that if symptoms not improving she should follow-up with hand specialist and was given contact information for local provider.  Recommend she call them to schedule appointment as soon as possible.  Discussed alarm symptoms that warrant emergent evaluation.  Strict return precautions given to which she expressed understanding.  Final Clinical Impressions(s) / UC Diagnoses   Final  diagnoses:  Thumb pain, left  Sprain of metacarpophalangeal (MCP) joint of left thumb, initial encounter     Discharge Instructions      Your x-ray was normal.  I am concerned that you have a sprain in this area.  Please use brace as prescribed.  Start Naprosyn twice daily to help with pain and inflammation.  As we discussed, this can upset your stomach including your acid reflux and if you have any abdominal  pain please stop the medication.  You should not take additional NSAIDs including aspirin, ibuprofen/Advil, naproxen/Aleve with this medicine.  I recommend that you follow-up with a hand specialist so please call to schedule an appointment particularly if symptoms are not improving within a few days.  If anything worsens return for reevaluation.     ED Prescriptions     Medication Sig Dispense Auth. Provider   naproxen (NAPROSYN) 375 MG tablet Take 1 tablet (375 mg total) by mouth 2 (two) times daily. 14 tablet Danell Verno, Derry Skill, PA-C      PDMP not reviewed this encounter.   Terrilee Croak, PA-C 11/13/21 [id number]

## 2021-11-13 NOTE — ED Triage Notes (Signed)
Pt presents with left thumb pain Xs 1 week. States thinks she jammed her thumb while getting food out of the freezer.

## 2021-11-13 NOTE — Discharge Instructions (Signed)
Your x-ray was normal.  I am concerned that you have a sprain in this area.  Please use brace as prescribed.  Start Naprosyn twice daily to help with pain and inflammation.  As we discussed, this can upset your stomach including your acid reflux and if you have any abdominal pain please stop the medication.  You should not take additional NSAIDs including aspirin, ibuprofen/Advil, naproxen/Aleve with this medicine.  I recommend that you follow-up with a hand specialist so please call to schedule an appointment particularly if symptoms are not improving within a few days.  If anything worsens return for reevaluation.

## 2021-12-19 ENCOUNTER — Other Ambulatory Visit: Payer: Self-pay

## 2021-12-19 ENCOUNTER — Ambulatory Visit (INDEPENDENT_AMBULATORY_CARE_PROVIDER_SITE_OTHER): Payer: No Typology Code available for payment source | Admitting: Family Medicine

## 2021-12-19 ENCOUNTER — Encounter (INDEPENDENT_AMBULATORY_CARE_PROVIDER_SITE_OTHER): Payer: Self-pay | Admitting: Family Medicine

## 2021-12-19 VITALS — BP 112/70 | HR 76 | Temp 98.0°F | Ht 63.0 in | Wt 127.0 lb

## 2021-12-19 DIAGNOSIS — K219 Gastro-esophageal reflux disease without esophagitis: Secondary | ICD-10-CM

## 2021-12-19 DIAGNOSIS — E7800 Pure hypercholesterolemia, unspecified: Secondary | ICD-10-CM

## 2021-12-19 DIAGNOSIS — Z6822 Body mass index (BMI) 22.0-22.9, adult: Secondary | ICD-10-CM

## 2021-12-19 DIAGNOSIS — R632 Polyphagia: Secondary | ICD-10-CM

## 2021-12-19 DIAGNOSIS — Z6832 Body mass index (BMI) 32.0-32.9, adult: Secondary | ICD-10-CM

## 2021-12-19 DIAGNOSIS — E669 Obesity, unspecified: Secondary | ICD-10-CM | POA: Diagnosis not present

## 2021-12-19 MED ORDER — WEGOVY 1.7 MG/0.75ML ~~LOC~~ SOAJ: 1.7000 mg | SUBCUTANEOUS | 1 refills | Status: DC

## 2021-12-25 NOTE — Progress Notes (Signed)
Chief Complaint:   OBESITY Angela Mcconnell is here to discuss her progress with her obesity treatment plan along with follow-up of her obesity related diagnoses. See Medical Weight Management Flowsheet for complete bioelectrical impedance results.  Today's visit was #: 30 Starting weight: 186 lbs Starting date: 06/01/2020 Weight change since last visit: 0 Total lbs lost to date: 59 lbs Total weight loss percentage to date: -31.72%  Nutrition Plan: Keeping a food journal and adhering to recommended goals of 1400 calories and 95 grams of protein daily for 95-100% of the time. Activity: Walking for 30-60 minutes 7 times per week.  Anti-obesity medications: Wegovy 1.7 mg subcutaneously weekly. Reported side effects: None.  Interim History: Angela Mcconnell says she is still happy with maintenance.  She says she had increased sugar cravings over the holidays after indulging in sweets.  Assessment/Plan:   1. Polyphagia Controlled. Current treatment: Wegovy 1.7 mg subcutaneously weekly.    Plan:  Continue Wegovy 1.7 mg subcutaneously weekly.  Will refill today, as per below.  She will continue to focus on protein-rich, low simple carbohydrate foods. We reviewed the importance of hydration, regular exercise for stress reduction, and restorative sleep.  - Refill Semaglutide-Weight Management (WEGOVY) 1.7 MG/0.75ML SOAJ; Inject 1.7 mg into the skin once a week.  Dispense: 9 mL; Refill: 1  2. Pure hypercholesterolemia Course: Not at goal. Lipid-lowering medications: None.   Plan: Dietary changes: Increase soluble fiber, decrease simple carbohydrates, decrease saturated fat. Exercise changes: Moderate to vigorous-intensity aerobic activity 150 minutes per week or as tolerated. We will continue to monitor along with PCP/specialists as it pertains to her weight loss journey.  Lab Results  Component Value Date   CHOL 226 (H) 06/01/2020   HDL 59 06/01/2020   LDLCALC 147 (H) 06/01/2020   TRIG 111 06/01/2020    CHOLHDL 3.3 02/07/2020   Lab Results  Component Value Date   ALT 14 06/01/2020   AST 20 06/01/2020   ALKPHOS 104 06/01/2020   BILITOT 0.4 06/01/2020   The 10-year ASCVD risk score (Arnett DK, et al., 2019) is: 3.4%   Values used to calculate the score:     Age: 61 years     Sex: Female     Is Non-Hispanic African American: Yes     Diabetic: No     Tobacco smoker: No     Systolic Blood Pressure: 213 mmHg     Is BP treated: No     HDL Cholesterol: 59 mg/dL     Total Cholesterol: 226 mg/dL  3. Gastroesophageal reflux disease, unspecified whether esophagitis present She is taking Protonix 40 mg daily for GERD.  We reviewed the diagnosis of GERD and importance of treatment. We discussed "red flag" symptoms and the importance of follow up if symptoms persisted despite treatment. We reviewed non-pharmacologic management of GERD symptoms: including: caffeine reduction, dietary changes, elevate HOB, NPO after supper, reduction of alcohol intake, tobacco cessation, and weight loss.  4. Obesity, current BMI 22.5  Course: Angela Mcconnell is currently in the action stage of change. As such, her goal is to continue with weight loss efforts.   Nutrition goals: She has agreed to keeping a food journal and adhering to recommended goals of 1400 calories and 95 grams of protein.   Exercise goals:  As is.  Behavioral modification strategies: increasing lean protein intake, decreasing simple carbohydrates, and increasing vegetables.  Angela Mcconnell has agreed to follow-up with our clinic in 3 months. She was informed of the importance of frequent follow-up  visits to maximize her success with intensive lifestyle modifications for her multiple health conditions.   Objective:   Blood pressure 112/70, pulse 76, temperature 98 F (36.7 C), temperature source Oral, height 5\' 3"  (1.6 m), weight 127 lb (57.6 kg), SpO2 99 %. Body mass index is 22.5 kg/m.  General: Cooperative, alert, well developed, in no acute  distress. HEENT: Conjunctivae and lids unremarkable. Cardiovascular: Regular rhythm.  Lungs: Normal work of breathing. Neurologic: No focal deficits.   Lab Results  Component Value Date   CREATININE 0.85 06/01/2020   BUN 10 06/01/2020   NA 141 06/01/2020   K 4.2 06/01/2020   CL 103 06/01/2020   CO2 24 06/01/2020   Lab Results  Component Value Date   ALT 14 06/01/2020   AST 20 06/01/2020   ALKPHOS 104 06/01/2020   BILITOT 0.4 06/01/2020   Lab Results  Component Value Date   HGBA1C 5.4 06/01/2020   Lab Results  Component Value Date   INSULIN 10.8 06/01/2020   Lab Results  Component Value Date   TSH 1.070 06/01/2020   Lab Results  Component Value Date   CHOL 226 (H) 06/01/2020   HDL 59 06/01/2020   LDLCALC 147 (H) 06/01/2020   TRIG 111 06/01/2020   CHOLHDL 3.3 02/07/2020   Lab Results  Component Value Date   VD25OH 39.6 06/01/2020   Lab Results  Component Value Date   WBC 8.6 06/01/2020   HGB 12.8 06/01/2020   HCT 40.1 06/01/2020   MCV 94 06/01/2020   PLT 319 06/01/2020   Lab Results  Component Value Date   IRON 95 06/01/2020   TIBC 315 06/01/2020   FERRITIN 228 (H) 06/01/2020   Attestation Statements:   Reviewed by clinician on day of visit: allergies, medications, problem list, medical history, surgical history, family history, social history, and previous encounter notes.  I, Water quality scientist, CMA, am acting as transcriptionist for Briscoe Deutscher, DO  I have reviewed the above documentation for accuracy and completeness, and I agree with the above. -  Briscoe Deutscher, DO, MS, FAAFP, DABOM - Family and Bariatric Medicine.

## 2022-03-11 ENCOUNTER — Ambulatory Visit (INDEPENDENT_AMBULATORY_CARE_PROVIDER_SITE_OTHER): Payer: No Typology Code available for payment source | Admitting: Family Medicine

## 2022-03-11 ENCOUNTER — Encounter (INDEPENDENT_AMBULATORY_CARE_PROVIDER_SITE_OTHER): Payer: Self-pay

## 2022-03-14 ENCOUNTER — Encounter (INDEPENDENT_AMBULATORY_CARE_PROVIDER_SITE_OTHER): Payer: Self-pay | Admitting: Family Medicine

## 2022-03-14 ENCOUNTER — Telehealth (INDEPENDENT_AMBULATORY_CARE_PROVIDER_SITE_OTHER): Payer: No Typology Code available for payment source | Admitting: Family Medicine

## 2022-03-14 DIAGNOSIS — E669 Obesity, unspecified: Secondary | ICD-10-CM

## 2022-03-14 DIAGNOSIS — E8881 Metabolic syndrome: Secondary | ICD-10-CM | POA: Diagnosis not present

## 2022-03-14 DIAGNOSIS — R632 Polyphagia: Secondary | ICD-10-CM | POA: Diagnosis not present

## 2022-03-14 DIAGNOSIS — E7800 Pure hypercholesterolemia, unspecified: Secondary | ICD-10-CM | POA: Diagnosis not present

## 2022-03-14 DIAGNOSIS — E88819 Insulin resistance, unspecified: Secondary | ICD-10-CM

## 2022-03-14 DIAGNOSIS — Z6823 Body mass index (BMI) 23.0-23.9, adult: Secondary | ICD-10-CM

## 2022-03-14 MED ORDER — WEGOVY 1.7 MG/0.75ML ~~LOC~~ SOAJ: 1.7000 mg | SUBCUTANEOUS | 1 refills | Status: DC

## 2022-03-14 NOTE — Progress Notes (Signed)
TeleHealth Visit:  Due to the COVID-19 pandemic, this visit was completed with telemedicine (audio/video) technology to reduce patient and provider exposure as well as to preserve personal protective equipment.   Angela Mcconnell has verbally consented to this TeleHealth visit. The patient is located at home, the provider is located at the [facility] office. The participants in this visit include the listed provider and patient. The visit was conducted today via MyChart video.  OBESITY Angela Mcconnell is here to discuss her progress with her obesity treatment plan along with follow-up of her obesity related diagnoses.   Today's visit was #: 18 Starting weight: 186 lbs Starting date: 06/01/2020 Today's date: 03/14/2022 Pt. Reported Weight:  128 lbs (Up 1 lb).  Interim History: Our last visit was in January. Since then, her childhood best friend died. She endorsed some emotional eating, but was able to reign it in quickly.   Nutrition Plan: keeping a food journal and adhering to recommended goals of 1500 calories and 95 protein.  Anti-obesity medications: Wegovy 1.7 mg weekly. Reported side effects: None. Activity: Currently at the beach.  Assessment/Plan:   1. Polyphagia Controlled. Current treatment: Wegovy as below.    Plan: She will continue to focus on protein-rich, low simple carbohydrate foods. We reviewed the importance of hydration, regular exercise for stress reduction, and restorative sleep.  - Semaglutide-Weight Management (WEGOVY) 1.7 MG/0.75ML SOAJ; Inject 1.7 mg into the skin once a week.  Dispense: 9 mL; Refill: 1  2. Pure hypercholesterolemia Course: Not at goal. Lipid-lowering medications: None.   Plan: Dietary changes: Increase soluble fiber, decrease simple carbohydrates, decrease saturated fat. Exercise changes: Moderate to vigorous-intensity aerobic activity 150 minutes per week or as tolerated. We will continue to monitor along with PCP as it pertains to her  weight loss journey.  Lab Results  Component Value Date   CHOL 226 (H) 06/01/2020   HDL 59 06/01/2020   LDLCALC 147 (H) 06/01/2020   TRIG 111 06/01/2020   CHOLHDL 3.3 02/07/2020   Lab Results  Component Value Date   ALT 14 06/01/2020   AST 20 06/01/2020   ALKPHOS 104 06/01/2020   BILITOT 0.4 06/01/2020   The 10-year ASCVD risk score (Arnett DK, et al., 2019) is: 3.4%   Values used to calculate the score:     Age: 61 years     Sex: Female     Is Non-Hispanic African American: Yes     Diabetic: No     Tobacco smoker: No     Systolic Blood Pressure: 381 mmHg     Is BP treated: No     HDL Cholesterol: 59 mg/dL     Total Cholesterol: 226 mg/dL  3. Insulin resistance At goal. Goal is HgbA1c < 5.7, fasting insulin closer to 5.  Medication: OFBPZW.    Plan:  She will continue to focus on protein-rich, low simple carbohydrate foods. We reviewed the importance of hydration, regular exercise for stress reduction, and restorative sleep.   Lab Results  Component Value Date   HGBA1C 5.4 06/01/2020   Lab Results  Component Value Date   INSULIN 10.8 06/01/2020   4. Obesity, current BMI 23.7  Angela Mcconnell is currently in the action stage of change. As such, her goal is to continue with weight loss efforts. She has agreed to keeping a food journal and adhering to recommended goals of 1400 calories and 95 grams of protein.   Exercise goals:  As is.  Behavioral modification strategies: increasing lean protein  intake, decreasing simple carbohydrates, increasing vegetables, and increasing water intake.  Angela Mcconnell has agreed to follow-up with our clinic in 4 weeks. She was informed of the importance of frequent follow-up visits to maximize her success with intensive lifestyle modifications for her multiple health conditions.  Objective:   VITALS: Per patient if applicable, see vitals. GENERAL: Alert and in no acute distress. CARDIOPULMONARY: No increased WOB. Speaking in clear sentences.   PSYCH: Pleasant and cooperative. Speech normal rate and rhythm. Affect is appropriate. Insight and judgement are appropriate. Attention is focused, linear, and appropriate.  NEURO: Oriented as arrived to appointment on time with no prompting.   Lab Results  Component Value Date   CREATININE 0.85 06/01/2020   BUN 10 06/01/2020   NA 141 06/01/2020   K 4.2 06/01/2020   CL 103 06/01/2020   CO2 24 06/01/2020   Lab Results  Component Value Date   ALT 14 06/01/2020   AST 20 06/01/2020   ALKPHOS 104 06/01/2020   BILITOT 0.4 06/01/2020   Lab Results  Component Value Date   HGBA1C 5.4 06/01/2020   Lab Results  Component Value Date   INSULIN 10.8 06/01/2020   Lab Results  Component Value Date   TSH 1.070 06/01/2020   Lab Results  Component Value Date   CHOL 226 (H) 06/01/2020   HDL 59 06/01/2020   LDLCALC 147 (H) 06/01/2020   TRIG 111 06/01/2020   CHOLHDL 3.3 02/07/2020   Lab Results  Component Value Date   WBC 8.6 06/01/2020   HGB 12.8 06/01/2020   HCT 40.1 06/01/2020   MCV 94 06/01/2020   PLT 319 06/01/2020   Lab Results  Component Value Date   IRON 95 06/01/2020   TIBC 315 06/01/2020   FERRITIN 228 (H) 06/01/2020   Attestation Statements:   Reviewed by clinician on day of visit: allergies, medications, problem list, medical history, surgical history, family history, social history, and previous encounter notes.  I, Water quality scientist, CMA, am acting as transcriptionist for Briscoe Deutscher, DO  I have reviewed the above documentation for accuracy and completeness, and I agree with the above. - Briscoe Deutscher, DO, MS, FAAFP, DABOM - Family and Bariatric Medicine.

## 2022-03-18 ENCOUNTER — Encounter (INDEPENDENT_AMBULATORY_CARE_PROVIDER_SITE_OTHER): Payer: Self-pay

## 2022-03-18 ENCOUNTER — Telehealth (INDEPENDENT_AMBULATORY_CARE_PROVIDER_SITE_OTHER): Payer: Self-pay | Admitting: Family Medicine

## 2022-03-18 NOTE — Telephone Encounter (Signed)
Prior authorization approved for [facility]. Effective: 03/15/2022 to 10/15/2022. Patient sent approval message via mychart. ?

## 2022-03-19 ENCOUNTER — Ambulatory Visit (INDEPENDENT_AMBULATORY_CARE_PROVIDER_SITE_OTHER): Payer: No Typology Code available for payment source | Admitting: Family Medicine

## 2022-04-19 ENCOUNTER — Other Ambulatory Visit: Payer: Self-pay | Admitting: Family Medicine

## 2022-04-19 DIAGNOSIS — Z1231 Encounter for screening mammogram for malignant neoplasm of breast: Secondary | ICD-10-CM

## 2022-05-13 ENCOUNTER — Ambulatory Visit
Admission: RE | Admit: 2022-05-13 | Discharge: 2022-05-13 | Disposition: A | Discharge status: Home / Self Care | Payer: No Typology Code available for payment source | Source: Ambulatory Visit | Attending: Family Medicine | Admitting: Family Medicine

## 2022-05-13 DIAGNOSIS — Z1231 Encounter for screening mammogram for malignant neoplasm of breast: Secondary | ICD-10-CM

## 2022-06-17 ENCOUNTER — Encounter (INDEPENDENT_AMBULATORY_CARE_PROVIDER_SITE_OTHER): Payer: Self-pay | Admitting: *Deleted

## 2022-06-20 ENCOUNTER — Encounter (INDEPENDENT_AMBULATORY_CARE_PROVIDER_SITE_OTHER): Payer: Self-pay | Admitting: Family Medicine

## 2022-06-20 ENCOUNTER — Ambulatory Visit (INDEPENDENT_AMBULATORY_CARE_PROVIDER_SITE_OTHER): Payer: No Typology Code available for payment source | Admitting: Family Medicine

## 2022-06-20 VITALS — BP 111/56 | HR 71 | Temp 98.3°F | Ht 63.0 in | Wt 126.0 lb

## 2022-06-20 DIAGNOSIS — E669 Obesity, unspecified: Secondary | ICD-10-CM | POA: Diagnosis not present

## 2022-06-20 DIAGNOSIS — F3289 Other specified depressive episodes: Secondary | ICD-10-CM | POA: Diagnosis not present

## 2022-06-20 DIAGNOSIS — Z6822 Body mass index (BMI) 22.0-22.9, adult: Secondary | ICD-10-CM | POA: Diagnosis not present

## 2022-06-20 DIAGNOSIS — Z6832 Body mass index (BMI) 32.0-32.9, adult: Secondary | ICD-10-CM

## 2022-06-26 NOTE — Progress Notes (Signed)
Chief Complaint:   OBESITY Angela Mcconnell is here to discuss her progress with her obesity treatment plan along with follow-up of her obesity related diagnoses. Ara is on keeping a food journal and adhering to recommended goals of 1400 calories and 95 grams of protein daily and states she is following her eating plan approximately 90% of the time. Tandrea states she is walking for 60 minutes 5 times per week.  Today's visit was #: 92 Starting weight: 186 lbs Starting date: 06/01/2020 Today's weight: 126 lbs Today's date: 06/20/2022 Total lbs lost to date: 60 Total lbs lost since last in-office visit: 1  Interim History: Carrington is doing well with maintaining her weight loss.  She is on Wegovy 1.7 mg but she is taking it every 10 days.  Her scale at home was up a few pounds and she is concerned that she will regain her weight.  She will be using a Peloton bike soon.  Subjective:   1. Other depression, emotional eating binges [facility] notes that her physical hunger is stable, but she notes increased emotional eating behaviors, worse in the p.m. and worse for carbohydrates.  Assessment/Plan:   1. Other depression, emotional eating binges Aamina was educated on the mechanisms of cravings and how we use simple carbohydrates to increase her in neurotransmitters.  Emotional eating behavior strategies were discussed in depth to help her counteract this.  2. Obesity, Current BMI 22.4 Kanyon is currently in the action stage of change. As such, her goal is to continue with weight loss efforts. She has agreed to practicing portion control and making smarter food choices, such as increasing vegetables and decreasing simple carbohydrates.   Patient is to continue with portion control and smarter choices and increase vegetables and lean protein.  Okay to continue [facility] as is.  Exercise goals: As is.   Behavioral modification strategies: increasing water intake and no skipping meals.  Michell  has agreed to follow-up with our clinic in months. She was informed of the importance of frequent follow-up visits to maximize her success with intensive lifestyle modifications for her multiple health conditions.   Objective:   Blood pressure (!) 111/56, pulse 71, temperature 98.3 F (36.8 C), height '5\' 3"'$  (1.6 m), weight 126 lb (57.2 kg). Body mass index is 22.32 kg/m.  General: Cooperative, alert, well developed, in no acute distress. HEENT: Conjunctivae and lids unremarkable. Cardiovascular: Regular rhythm.  Lungs: Normal work of breathing. Neurologic: No focal deficits.   Lab Results  Component Value Date   CREATININE 0.85 06/01/2020   BUN 10 06/01/2020   NA 141 06/01/2020   K 4.2 06/01/2020   CL 103 06/01/2020   CO2 24 06/01/2020   Lab Results  Component Value Date   ALT 14 06/01/2020   AST 20 06/01/2020   ALKPHOS 104 06/01/2020   BILITOT 0.4 06/01/2020   Lab Results  Component Value Date   HGBA1C 5.4 06/01/2020   Lab Results  Component Value Date   INSULIN 10.8 06/01/2020   Lab Results  Component Value Date   TSH 1.070 06/01/2020   Lab Results  Component Value Date   CHOL 226 (H) 06/01/2020   HDL 59 06/01/2020   LDLCALC 147 (H) 06/01/2020   TRIG 111 06/01/2020   CHOLHDL 3.3 02/07/2020   Lab Results  Component Value Date   VD25OH 39.6 06/01/2020   Lab Results  Component Value Date   WBC 8.6 06/01/2020   HGB 12.8 06/01/2020   HCT 40.1 06/01/2020  MCV 94 06/01/2020   PLT 319 06/01/2020   Lab Results  Component Value Date   IRON 95 06/01/2020   TIBC 315 06/01/2020   FERRITIN 228 (H) 06/01/2020   Attestation Statements:   Reviewed by clinician on day of visit: allergies, medications, problem list, medical history, surgical history, family history, social history, and previous encounter notes.  Time spent on visit including pre-visit chart review and post-visit care and charting was 30 minutes.   I, Trixie Dredge, am acting as  transcriptionist for Dennard Nip, MD.  I have reviewed the above documentation for accuracy and completeness, and I agree with the above. -  Dennard Nip, MD

## 2022-07-10 ENCOUNTER — Encounter (INDEPENDENT_AMBULATORY_CARE_PROVIDER_SITE_OTHER): Payer: Self-pay

## 2022-08-20 ENCOUNTER — Encounter (INDEPENDENT_AMBULATORY_CARE_PROVIDER_SITE_OTHER): Payer: Self-pay | Admitting: Family Medicine

## 2022-09-12 NOTE — Progress Notes (Signed)
61 y.o. G2P0000 Significant Other African American female here for annual exam.    PCP:   she is looking for a PCP   No LMP recorded. Patient has had a hysterectomy.           Sexually active: No.  The current method of family planning is status post hysterectomy.    Exercising: Yes.     Cardio  Smoker:  no  Health Maintenance: Pap:    2010, normal at time of Hyst History of abnormal Pap:  yes MMG:  05/13/22 density B Bi-rads 1 neg  Colonoscopy:  about 9 years ago per patient results where normal  BMD:   n/a  Result   TDaP:  2022 Gardasil:   n/a HIV: 02/07/20 neg Hep C: 02/07/20 neg Screening Labs:  PCP Has Covid vaccine scheduled.  She will consider flu vaccine at her pharmacy.    reports that she has never smoked. She has never used smokeless tobacco. She reports that she does not drink alcohol and does not use drugs.  Past Medical History:  Diagnosis Date   Anemia    Arthritis    Asthma    Blood transfusion without reported diagnosis 11/2018   Chest pain    Depression    Fibroid    GERD (gastroesophageal reflux disease)    Insomnia    Joint pain    Multiple food allergies    Osteoarthritis    Palpitations    Shortness of breath    Vitamin D deficiency     Past Surgical History:  Procedure Laterality Date   ABDOMINAL HYSTERECTOMY     [facility]   CORONARY ANGIOGRAM  Nov 2010   Normal coronary arteries   ESOPHAGEAL MANOMETRY N/A 08/12/2018   Procedure: ESOPHAGEAL MANOMETRY (EM);  Surgeon: Clarene Essex, MD;  Location: [facility];  Service: Endoscopy;  Laterality: N/A;   HERNIA REPAIR     INSERTION OF MESH N/A 10/14/2018   Procedure: INSERTION OF MESH;  Surgeon: Michael Boston, MD;  Location: [facility];  Service: General;  Laterality: N/A;   KNEE SURGERY     OOPHORECTOMY      Current Outpatient Medications  Medication Sig Dispense Refill   Cholecalciferol (VITAMIN D3) 50 MCG (2000 UT) TABS Take by mouth daily.      EPINEPHrine (AUVI-Q) 0.3 mg/0.3 mL IJ SOAJ injection  Inject 0.3 mLs (0.3 mg total) into the muscle as needed for anaphylaxis. 1 each 2   hydrOXYzine (ATARAX/VISTARIL) 25 MG tablet TAKE 1 TABLET BY MOUTH AT BEDTIME AS NEEDED. 30 tablet 0   Multiple Vitamin (MULTIVITAMIN) tablet Take 2 tablets by mouth daily.     Multiple Vitamins-Minerals (AIRBORNE PO) Take by mouth.     OVER THE COUNTER MEDICATION [place] Vitamin     Semaglutide-Weight Management (WEGOVY) 1.7 MG/0.75ML SOAJ Inject 1.7 mg into the skin once a week. 9 mL 1   traZODone (DESYREL) 100 MG tablet Take 100 mg by mouth at bedtime.     No current facility-administered medications for this visit.    Family History  Problem Relation Age of Onset   Hypertension Mother    Heart disease Mother    Stroke Mother    Obesity Mother    Cancer Father 63       lung cancer, breast cancer   Hypertension Father    Cancer Sister    Thyroid disease Sister        Thyroidectomy   Ovarian cancer Sister 49   Heart attack Brother 76  Breast cancer Neg Hx     Review of Systems  All other systems reviewed and are negative.   Exam:   BP 110/68   Pulse 77   Ht '5\' 3"'$  (1.6 m)   Wt 134 lb (60.8 kg)   SpO2 100%   BMI 23.74 kg/m     General appearance: alert, cooperative and appears stated age Head: normocephalic, without obvious abnormality, atraumatic Neck: no adenopathy, supple, symmetrical, trachea midline and thyroid normal to inspection and palpation Lungs: clear to auscultation bilaterally Breasts: normal appearance, no masses or tenderness, No nipple retraction or dimpling, No nipple discharge or bleeding, No axillary adenopathy Heart: regular rate and rhythm Abdomen: soft, non-tender; no masses, no organomegaly Extremities: extremities normal, atraumatic, no cyanosis or edema Skin: skin color, texture, turgor normal. No rashes or lesions Lymph nodes: cervical, supraclavicular, and axillary nodes normal. Neurologic: grossly normal  Pelvic: External genitalia:  no lesions               No abnormal inguinal nodes palpated.              Urethra:  normal appearing urethra with no masses, tenderness or lesions              Bartholins and Skenes: normal                 Vagina: normal appearing vagina with normal color and discharge, no lesions              Cervix: absent              Pap taken: no Bimanual Exam:  Uterus:  absent              Adnexa: no mass, fullness, tenderness              Rectal exam: yes.  Confirms.              Anus:  normal sphincter tone, no lesions  Chaperone was present for exam:  Eustace Pen, CMA  Assessment:   Well woman visit with gynecologic exam. Status post hysterectomy.  Status post laparoscopic BSO and LOA. FH ovarian cancer in sister and breast and lung CA in her father.  Colon cancer screening.   Plan: Mammogram screening discussed. Self breast awareness reviewed. Pap and HR HPV as above. Guidelines for Calcium, Vitamin D, regular exercise program including cardiovascular and weight bearing exercise. Referral for genetic counseling and testing.  Referral for colonoscopy. Follow up annually and prn.   After visit summary provided.

## 2022-09-19 ENCOUNTER — Telehealth: Payer: Self-pay | Admitting: Obstetrics and Gynecology

## 2022-09-19 ENCOUNTER — Ambulatory Visit (INDEPENDENT_AMBULATORY_CARE_PROVIDER_SITE_OTHER): Payer: No Typology Code available for payment source | Admitting: Obstetrics and Gynecology

## 2022-09-19 ENCOUNTER — Encounter: Payer: Self-pay | Admitting: Obstetrics and Gynecology

## 2022-09-19 VITALS — BP 110/68 | HR 77 | Ht 63.0 in | Wt 134.0 lb

## 2022-09-19 DIAGNOSIS — Z8041 Family history of malignant neoplasm of ovary: Secondary | ICD-10-CM

## 2022-09-19 DIAGNOSIS — Z1211 Encounter for screening for malignant neoplasm of colon: Secondary | ICD-10-CM

## 2022-09-19 DIAGNOSIS — Z803 Family history of malignant neoplasm of breast: Secondary | ICD-10-CM | POA: Diagnosis not present

## 2022-09-19 DIAGNOSIS — Z01419 Encounter for gynecological examination (general) (routine) without abnormal findings: Secondary | ICD-10-CM | POA: Diagnosis not present

## 2022-09-19 NOTE — Telephone Encounter (Signed)
Please make a referral to Dr. Tyrone Sage for colonoscopy for routine colon cancer screening.

## 2022-09-19 NOTE — Telephone Encounter (Signed)
Office notes faxed to Dr.Mann office they will call to schedule.

## 2022-09-19 NOTE — Patient Instructions (Addendum)
EXERCISE AND DIET:  We recommended that you start or continue a regular exercise program for good health. Regular exercise means any activity that makes your heart beat faster and makes you sweat.  We recommend exercising at least 30 minutes per day at least 3 days a week, preferably 4 or 5.  We also recommend a diet low in fat and sugar.  Inactivity, poor dietary choices and obesity can cause diabetes, heart attack, stroke, and kidney damage, among others.    ALCOHOL AND SMOKING:  Women should limit their alcohol intake to no more than 7 drinks/beers/glasses of wine (combined, not each!) per week. Moderation of alcohol intake to this level decreases your risk of breast cancer and liver damage. And of course, no recreational drugs are part of a healthy lifestyle.  And absolutely no smoking or even second hand smoke. Most people know smoking can cause heart and lung diseases, but did you know it also contributes to weakening of your bones? Aging of your skin?  Yellowing of your teeth and nails?  CALCIUM AND VITAMIN D:  Adequate intake of calcium and Vitamin D are recommended.  The recommendations for exact amounts of these supplements seem to change often, but generally speaking 600 mg of calcium (either carbonate or citrate) and 800 units of Vitamin D per day seems prudent. Certain women may benefit from higher intake of Vitamin D.  If you are among these women, your doctor will have told you during your visit.    PAP SMEARS:  Pap smears, to check for cervical cancer or precancers,  have traditionally been done yearly, although recent scientific advances have shown that most women can have pap smears less often.  However, every woman still should have a physical exam from her gynecologist every year. It will include a breast check, inspection of the vulva and vagina to check for abnormal growths or skin changes, a visual exam of the cervix, and then an exam to evaluate the size and shape of the uterus and  ovaries.  And after 60 years of age, a rectal exam is indicated to check for rectal cancers. We will also provide age appropriate advice regarding health maintenance, like when you should have certain vaccines, screening for sexually transmitted diseases, bone density testing, colonoscopy, mammograms, etc.   MAMMOGRAMS:  All women over 40 years old should have a yearly mammogram. Many facilities now offer a "3D" mammogram, which may cost around $50 extra out of pocket. If possible,  we recommend you accept the option to have the 3D mammogram performed.  It both reduces the number of women who will be called back for extra views which then turn out to be normal, and it is better than the routine mammogram at detecting truly abnormal areas.    COLONOSCOPY:  Colonoscopy to screen for colon cancer is recommended for all women at age 50.  We know, you hate the idea of the prep.  We agree, BUT, having colon cancer and not knowing it is worse!!  Colon cancer so often starts as a polyp that can be seen and removed at colonscopy, which can quite literally save your life!  And if your first colonoscopy is normal and you have no family history of colon cancer, most women don't have to have it again for 10 years.  Once every ten years, you can do something that may end up saving your life, right?  We will be happy to help you get it scheduled when you are ready.    Be sure to check your insurance coverage so you understand how much it will cost.  It may be covered as a preventative service at no cost, but you should check your particular policy.    Consider [facility] or [facility] for your primary care provider.  Calcium Content in Foods Calcium is the most abundant mineral in the body. Most of the body's calcium supply is stored in bones and teeth. Calcium helps many parts of the body function normally, including: Blood and blood vessels. Nerves. Hormones. Muscles. Bones and  teeth. When your calcium stores are low, you may be at risk for low bone mass, bone loss, and broken bones (fractures). When you get enough calcium, it helps to support strong bones and teeth throughout your life. Calcium is especially important for: Children during growth spurts. Girls during adolescence. Women who are pregnant or breastfeeding. Women after their menstrual cycle stops (postmenopause). Women whose menstrual cycle has stopped due to anorexia nervosa or regular intense exercise. People who cannot eat or digest dairy products. Vegans. Recommended daily amounts of calcium: Women (ages 24 to 69): 1,000 mg per day. Women (ages 64 and older): 1,200 mg per day. Men (ages 80 to 38): 1,000 mg per day. Men (ages 44 and older): 1,200 mg per day. Women (ages 28 to 44): 1,300 mg per day. Men (ages 54 to 63): 1,300 mg per day. General information Eat foods that are high in calcium. Try to get most of your calcium from food. Some people may benefit from taking calcium supplements. Check with your health care provider or diet and nutrition specialist (dietitian) before starting any calcium supplements. Calcium supplements may interact with certain medicines. Too much calcium may cause other health problems, such as constipation and kidney stones. For the body to absorb calcium, it needs vitamin D. Sources of vitamin D include: Skin exposure to direct sunlight. Foods, such as egg yolks, liver, mushrooms, saltwater fish, and fortified milk. Vitamin D supplements. Check with your health care provider or dietitian before starting any vitamin D supplements. What foods are high in calcium?  Foods that are high in calcium contain more than 100 milligrams per serving. Fruits Fortified orange juice or other fruit juice, 300 mg per 8 oz serving. Vegetables Collard greens, 360 mg per 8 oz serving. Kale, 100 mg per 8 oz serving. Bok choy, 160 mg per 8 oz serving. Grains Fortified ready-to-eat  cereals, 100 to 1,000 mg per 8 oz serving. Fortified frozen waffles, 200 mg in 2 waffles. Oatmeal, 140 mg in 1 cup. Meats and other proteins Sardines, canned with bones, 325 mg per 3 oz serving. Salmon, canned with bones, 180 mg per 3 oz serving. Canned shrimp, 125 mg per 3 oz serving. Baked beans, 160 mg per 4 oz serving. Tofu, firm, made with calcium sulfate, 253 mg per 4 oz serving. Dairy Yogurt, plain, low-fat, 310 mg per 6 oz serving. Nonfat milk, 300 mg per 8 oz serving. American cheese, 195 mg per 1 oz serving. Cheddar cheese, 205 mg per 1 oz serving. Cottage cheese 2%, 105 mg per 4 oz serving. Fortified soy, rice, or almond milk, 300 mg per 8 oz serving. Mozzarella, part skim, 210 mg per 1 oz serving. The items listed above may not be a complete list of foods high in calcium. Actual amounts of calcium may be different depending on processing. Contact a dietitian for more information. What foods are lower in calcium? Foods that are lower in calcium contain 50  mg or less per serving. Fruits Apple, about 6 mg. Banana, about 12 mg. Vegetables Lettuce, 19 mg per 2 oz serving. Tomato, about 11 mg. Grains Rice, 4 mg per 6 oz serving. Boiled potatoes, 14 mg per 8 oz serving. White bread, 6 mg per slice. Meats and other proteins Egg, 27 mg per 2 oz serving. Red meat, 7 mg per 4 oz serving. Chicken, 17 mg per 4 oz serving. Fish, cod, or trout, 20 mg per 4 oz serving. Dairy Cream cheese, regular, 14 mg per 1 Tbsp serving. Brie cheese, 50 mg per 1 oz serving. Parmesan cheese, 70 mg per 1 Tbsp serving. The items listed above may not be a complete list of foods lower in calcium. Actual amounts of calcium may be different depending on processing. Contact a dietitian for more information. Summary Calcium is an important mineral in the body because it affects many functions. Getting enough calcium helps support strong bones and teeth throughout your life. Try to get most of your  calcium from food. Calcium supplements may interact with certain medicines. Check with your health care provider or dietitian before starting any calcium supplements. This information is not intended to replace advice given to you by your health care provider. Make sure you discuss any questions you have with your health care provider. Document Revised: 03/15/2020 Document Reviewed: 03/15/2020 Elsevier Patient Education  [place].

## 2022-09-20 ENCOUNTER — Telehealth: Payer: Self-pay | Admitting: Genetic Counselor

## 2022-09-20 NOTE — Telephone Encounter (Signed)
Scheduled appt per 10/19 referral. Pt is aware of appt date and time. Pt is aware to arrive 15 mins prior to appt time and to bring and updated insurance card. Pt is aware of appt location.   

## 2022-09-23 ENCOUNTER — Ambulatory Visit (INDEPENDENT_AMBULATORY_CARE_PROVIDER_SITE_OTHER): Payer: No Typology Code available for payment source | Admitting: Family Medicine

## 2022-10-03 NOTE — Telephone Encounter (Signed)
Encounter reviewed and closed.  

## 2022-10-10 ENCOUNTER — Encounter (INDEPENDENT_AMBULATORY_CARE_PROVIDER_SITE_OTHER): Payer: Self-pay | Admitting: Family Medicine

## 2022-10-10 ENCOUNTER — Ambulatory Visit (INDEPENDENT_AMBULATORY_CARE_PROVIDER_SITE_OTHER): Payer: No Typology Code available for payment source | Admitting: Family Medicine

## 2022-10-10 VITALS — BP 122/83 | HR 78 | Temp 97.6°F | Ht 63.0 in | Wt 127.8 lb

## 2022-10-10 DIAGNOSIS — Z6822 Body mass index (BMI) 22.0-22.9, adult: Secondary | ICD-10-CM | POA: Diagnosis not present

## 2022-10-10 DIAGNOSIS — R632 Polyphagia: Secondary | ICD-10-CM | POA: Diagnosis not present

## 2022-10-10 DIAGNOSIS — E669 Obesity, unspecified: Secondary | ICD-10-CM

## 2022-10-10 MED ORDER — SEMAGLUTIDE-WEIGHT MANAGEMENT 1 MG/0.5ML ~~LOC~~ SOAJ: 1.0000 mg | SUBCUTANEOUS | 0 refills | Status: DC

## 2022-10-28 NOTE — Progress Notes (Signed)
Chief Complaint:   OBESITY Angela Mcconnell is here to discuss her progress with her obesity treatment plan along with follow-up of her obesity related diagnoses. Angela Mcconnell is on practicing portion control and making smarter food choices, such as increasing vegetables and decreasing simple carbohydrates and states she is following her eating plan approximately 0% of the time. Angela Mcconnell states she is bike riding, on the treadmill, and walking for 20 minutes 7 times per week.  Today's visit was #: 20 Starting weight: 186 lbs Starting date: 06/01/2020 Today's weight: 127 lbs Today's date: 10/10/2022 Total lbs lost to date: 58 Total lbs lost since last in-office visit: 0  Interim History: Angela Mcconnell has foot surgery and she was off her Wegovy for 3 weeks. When she restarted, she had significant nausea.   Subjective:   1. Polyphagia Angela Mcconnell's polyphagia has improved on Wegovy, but she notes increased simple carbohydrates and eating out while off Wegovy.   Assessment/Plan:   1. Polyphagia Angela Mcconnell will continue Wegovy but at 1 mg dose, and we will follow-up at her next visit in 3 months.   2. Obesity, Current BMI 22.6 Angela Mcconnell is currently in the action stage of change. As such, her goal is to maintain weight for now. She has agreed to practicing portion control and making smarter food choices, such as increasing vegetables and decreasing simple carbohydrates.   Angela Mcconnell agreed to decrease Wegovy to 1 mg once weekly, and we will refill for 90 days.   - Semaglutide-Weight Management 1 MG/0.5ML SOAJ; Inject 1 mg into the skin once a week.  Dispense: 6 mL; Refill: 0  Exercise goals: As is.   Behavioral modification strategies: increasing lean protein intake and holiday eating strategies .  Angela Mcconnell has agreed to follow-up with our clinic in 12 weeks. She was informed of the importance of frequent follow-up visits to maximize her success with intensive lifestyle modifications for her multiple health  conditions.   Objective:   Blood pressure 122/83, pulse 78, temperature 97.6 F (36.4 C), height '5\' 3"'$  (1.6 m), weight 127 lb 12.8 oz (58 kg), SpO2 99 %. Body mass index is 22.64 kg/m.  General: Cooperative, alert, well developed, in no acute distress. HEENT: Conjunctivae and lids unremarkable. Cardiovascular: Regular rhythm.  Lungs: Normal work of breathing. Neurologic: No focal deficits.   Lab Results  Component Value Date   CREATININE 0.85 06/01/2020   BUN 10 06/01/2020   NA 141 06/01/2020   K 4.2 06/01/2020   CL 103 06/01/2020   CO2 24 06/01/2020   Lab Results  Component Value Date   ALT 14 06/01/2020   AST 20 06/01/2020   ALKPHOS 104 06/01/2020   BILITOT 0.4 06/01/2020   Lab Results  Component Value Date   HGBA1C 5.4 06/01/2020   Lab Results  Component Value Date   INSULIN 10.8 06/01/2020   Lab Results  Component Value Date   TSH 1.070 06/01/2020   Lab Results  Component Value Date   CHOL 226 (H) 06/01/2020   HDL 59 06/01/2020   LDLCALC 147 (H) 06/01/2020   TRIG 111 06/01/2020   CHOLHDL 3.3 02/07/2020   Lab Results  Component Value Date   VD25OH 39.6 06/01/2020   Lab Results  Component Value Date   WBC 8.6 06/01/2020   HGB 12.8 06/01/2020   HCT 40.1 06/01/2020   MCV 94 06/01/2020   PLT 319 06/01/2020   Lab Results  Component Value Date   IRON 95 06/01/2020   TIBC 315 06/01/2020  FERRITIN 228 (H) 06/01/2020   Attestation Statements:   Reviewed by clinician on day of visit: allergies, medications, problem list, medical history, surgical history, family history, social history, and previous encounter notes.  Time spent on visit including pre-visit chart review and post-visit care and charting was 30 minutes.   I, Trixie Dredge, am acting as transcriptionist for Dennard Nip, MD.  I have reviewed the above documentation for accuracy and completeness, and I agree with the above. -  Dennard Nip, MD

## 2022-12-05 ENCOUNTER — Inpatient Hospital Stay: Payer: No Typology Code available for payment source

## 2022-12-05 ENCOUNTER — Inpatient Hospital Stay: Payer: No Typology Code available for payment source | Attending: Genetic Counselor | Admitting: Genetic Counselor

## 2022-12-05 ENCOUNTER — Other Ambulatory Visit: Payer: Self-pay

## 2022-12-05 DIAGNOSIS — Z803 Family history of malignant neoplasm of breast: Secondary | ICD-10-CM

## 2022-12-05 DIAGNOSIS — Z8041 Family history of malignant neoplasm of ovary: Secondary | ICD-10-CM | POA: Diagnosis not present

## 2022-12-05 LAB — GENETIC SCREENING ORDER

## 2022-12-06 ENCOUNTER — Encounter: Payer: Self-pay | Admitting: Genetic Counselor

## 2022-12-06 DIAGNOSIS — Z8041 Family history of malignant neoplasm of ovary: Secondary | ICD-10-CM | POA: Insufficient documentation

## 2022-12-06 DIAGNOSIS — Z803 Family history of malignant neoplasm of breast: Secondary | ICD-10-CM | POA: Insufficient documentation

## 2022-12-06 NOTE — Progress Notes (Signed)
REFERRING PROVIDER: Nunzio Cobbs, MD [place],  [place] [address]  PRIMARY PROVIDER:  Charlott Rakes, MD  PRIMARY REASON FOR VISIT:  Encounter Diagnoses  Name Primary?   Family history of breast cancer Yes   Family history of ovarian cancer     HISTORY OF PRESENT ILLNESS:   Angela Mcconnell, a 62 y.o. female, was seen for a [place] cancer genetics consultation at the request of Dr. Yisroel Ramming due to a family history of cancer.  Angela Mcconnell presents to clinic today to discuss the possibility of a hereditary predisposition to cancer, to discuss genetic testing, and to further clarify her future cancer risks, as well as potential cancer risks for family members.   Angela Mcconnell is a 62 y.o. female with no personal history of cancer.    RISK FACTORS:  Menarche was at age 7.  First live birth at age 17.  OCP use for approximately 8 years.  Ovaries intact: no.  Uterus intact: no.  Menopausal status: postmenopausal, menopause at age 52  HRT use: 1 year. Colonoscopy: yes; normal in 2023. Mammogram within the last year: yes. Number of breast biopsies: 0. Up to date with pelvic exams: yes. Any excessive radiation exposure in the past: no  Past Medical History:  Diagnosis Date   Anemia    Arthritis    Asthma    Blood transfusion without reported diagnosis 11/2018   Chest pain    Depression    Fibroid    GERD (gastroesophageal reflux disease)    Insomnia    Joint pain    Multiple food allergies    Osteoarthritis    Palpitations    Shortness of breath    Vitamin D deficiency     Past Surgical History:  Procedure Laterality Date   ABDOMINAL HYSTERECTOMY     [facility]   CORONARY ANGIOGRAM  Nov 2010   Normal coronary arteries   ESOPHAGEAL MANOMETRY N/A 08/12/2018   Procedure: ESOPHAGEAL MANOMETRY (EM);  Surgeon: Clarene Essex, MD;  Location: [facility];  Service: Endoscopy;  Laterality: N/A;   HERNIA REPAIR     INSERTION OF MESH N/A  10/14/2018   Procedure: INSERTION OF MESH;  Surgeon: Michael Boston, MD;  Location: [facility];  Service: General;  Laterality: N/A;   KNEE SURGERY     OOPHORECTOMY      Social History   Socioeconomic History   Marital status: Significant Other    Spouse name: Not on file   Number of children: Not on file   Years of education: Not on file   Highest education level: Not on file  Occupational History   Occupation: remote employee  Tobacco Use   Smoking status: Never   Smokeless tobacco: Never  Vaping Use   Vaping Use: Never used  Substance and Sexual Activity   Alcohol use: No   Drug use: No   Sexual activity: Not Currently  Other Topics Concern   Not on file  Social History Narrative   ** Merged History Encounter **       Social Determinants of Health   Financial Resource Strain: Not on file  Food Insecurity: Not on file  Transportation Needs: Not on file  Physical Activity: Not on file  Stress: Not on file  Social Connections: Not on file     FAMILY HISTORY: We obtained a detailed, 4-generation family history.  Significant diagnoses are listed below:  Family History  Problem Relation Age of Onset  Hypertension Mother    Heart disease Mother    Stroke Mother    Obesity Mother    Hypertension Father    Lung cancer Father 2       smoked   Breast cancer Father 8 - 65       recurred in 8s   Thyroid cancer Sister 76 - 1   Ovarian cancer Sister 38   Heart attack Brother 72   Cancer Maternal Aunt        unknown type   Cancer Maternal Aunt        unknown type   Cancer Maternal Uncle        unknown type   Cancer Paternal Aunt        unknown type   Cancer Paternal Aunt        unknown type   Cancer Paternal Grandfather        unknown type   Cancer Half-Sister        paternal half-sister, unknown type of cancer   Cancer Half-Brother        paternal half-brother, blood cancer         Angela Mcconnell has two full sisters. One was diagnosed with thyroid  cancer in her 37s and one was diagnosed with ovarian cancer at age 10. Angela Mcconnell has 7 maternal aunts and 2 had a history of an unknown type of cancer, they are deceased. She has 3 maternal uncles and one had a history of an unknown type of cancer, he is deceased.   Angela Mcconnell father was diagnosed with female breast cancer in his 75s and his breast cancer recurred in his 75s. He was also diagnosed with lung cancer at age 40, he died at age 68. She has 2 paternal aunts and both had a history of an unknown type of cancer, they are deceased. Her paternal grandfather was diagnosed with an unknown type of cancer, he is deceased.   Angela Mcconnell is unaware of previous family history of genetic testing for hereditary cancer risks. There is no reported Ashkenazi Jewish ancestry.   GENETIC COUNSELING ASSESSMENT: Angela Mcconnell is a 62 y.o. female with a family history of cancer which is somewhat suggestive of a hereditary predisposition to cancer given her father's history of female breast cancer and sister's history of ovarian cancer. We, therefore, discussed and recommended the following at today's visit.   DISCUSSION: We discussed that 5 - 10% of cancer is hereditary, with most cases of hereditary breast and ovarian cancer associated with BRCA1/2.  There are other genes that can be associated with hereditary breast and ovarian cancer syndromes.  We discussed that testing is beneficial for several reasons, including knowing about other cancer risks, identifying potential screening and risk-reduction options that may be appropriate, and to understanding if other family members could be at risk for cancer and allowing them to undergo genetic testing.  We reviewed the characteristics, features and inheritance patterns of hereditary cancer syndromes. We also discussed genetic testing, including the appropriate family members to test, the process of testing, insurance coverage and turn-around-time for results. We  discussed the implications of a negative, positive, carrier and/or variant of uncertain significant result. We discussed that negative results would be uninformative given that Angela Mcconnell does not have a personal history of cancer. We recommended Angela Mcconnell pursue genetic testing for a panel that contains genes associated with breast, thyroid, and ovarian cancer.  Angela Mcconnell was offered a common hereditary cancer panel (47 genes) and  an expanded pan-cancer panel (77 genes). Angela Mcconnell was informed of the benefits and limitations of each panel, including that expanded pan-cancer panels contain several genes that do not have clear management guidelines at this point in time.  We also discussed that as the number of genes included on a panel increases, the chances of variants of uncertain significance increases.  After considering the benefits and limitations of each gene panel, Angela Mcconnell elected to have CancerNext-Expanded Panel.  The CancerNext-Expanded gene panel offered by [facility] and includes sequencing, rearrangement, and RNA analysis for the following 77 genes: AIP, ALK, APC, ATM, AXIN2, BAP1, BARD1, BLM, BMPR1A, BRCA1, BRCA2, BRIP1, CDC73, CDH1, CDK4, CDKN1B, CDKN2A, CHEK2, CTNNA1, DICER1, FANCC, FH, FLCN, GALNT12, KIF1B, LZTR1, MAX, MEN1, MET, MLH1, MSH2, MSH3, MSH6, MUTYH, NBN, NF1, NF2, NTHL1, PALB2, PHOX2B, PMS2, POT1, PRKAR1A, PTCH1, PTEN, RAD51C, RAD51D, RB1, RECQL, RET, SDHA, SDHAF2, SDHB, SDHC, SDHD, SMAD4, SMARCA4, SMARCB1, SMARCE1, STK11, SUFU, TMEM127, TP53, TSC1, TSC2, VHL and XRCC2 (sequencing and deletion/duplication); EGFR, EGLN1, HOXB13, KIT, MITF, PDGFRA, POLD1, and POLE (sequencing only); EPCAM and GREM1 (deletion/duplication only).    Based on Angela Mcconnell family history of cancer, she meets medical criteria for genetic testing. Despite that she meets criteria, she may still have an out of pocket cost. We discussed that if her out of pocket cost for testing is over $100,  the laboratory will call and confirm whether she wants to proceed with testing.  If the out of pocket cost of testing is less than $100 she will be billed by the genetic testing laboratory.   We discussed that some people do not want to undergo genetic testing due to fear of genetic discrimination.  A federal law called the Genetic Information Non-Discrimination Act (GINA) of 2008 helps protect individuals against genetic discrimination based on their genetic test results.  It impacts both health insurance and employment.  With health insurance, it protects against increased premiums, being kicked off insurance or being forced to take a test in order to be insured.  For employment it protects against hiring, firing and promoting decisions based on genetic test results.  GINA does not apply to those in the [employer], those who work for companies with less than 15 employees, and new life insurance or long-term disability insurance policies.  Health status due to a cancer diagnosis is not protected under GINA.  PLAN: After considering the risks, benefits, and limitations, Angela Mcconnell provided informed consent to pursue genetic testing and the blood sample was sent to [facility] for analysis of the CancerNext-Expanded Panel. Results should be available within approximately 2-3 weeks' time, at which point they will be disclosed by telephone to Angela Mcconnell, as will any additional recommendations warranted by these results. Angela Mcconnell will receive a summary of her genetic counseling visit and a copy of her results once available. This information will also be available in Epic.   Angela Mcconnell questions were answered to her satisfaction today. Our contact information was provided should additional questions or concerns arise. Thank you for the referral and allowing [place] to share in the care of your patient.   Lucille Passy, MS, [facility] Genetic Counselor [email or web address] (P) [phone]  The  patient was seen for a total of 35 minutes in face-to-face genetic counseling. The patient was seen alone.  Drs. Lindi Adie and/or Burr Medico were available to discuss this case as needed.  _______________________________________________________________________ For Office Staff:  Number of people involved in session: 1 Was an Intern/ student involved with case: no

## 2022-12-20 ENCOUNTER — Encounter: Payer: Self-pay | Admitting: Genetic Counselor

## 2022-12-20 ENCOUNTER — Telehealth: Payer: Self-pay | Admitting: Genetic Counselor

## 2022-12-20 DIAGNOSIS — Z1379 Encounter for other screening for genetic and chromosomal anomalies: Secondary | ICD-10-CM | POA: Insufficient documentation

## 2022-12-20 NOTE — Telephone Encounter (Addendum)
I contacted Ms. Ziebell to discuss her genetic testing results. No pathogenic variants were identified in the 77 genes analyzed. 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 Detailed clinic note to follow.  The test report has been scanned into EPIC and is located under the Molecular Pathology section of the Results Review tab.  A portion of the result report is included below for reference.   Lucille Passy, MS, [facility] Genetic Counselor [email or web address] (P) [phone]

## 2022-12-23 ENCOUNTER — Ambulatory Visit: Payer: Self-pay | Admitting: Genetic Counselor

## 2022-12-23 DIAGNOSIS — Z1379 Encounter for other screening for genetic and chromosomal anomalies: Secondary | ICD-10-CM

## 2022-12-23 NOTE — Progress Notes (Signed)
HPI:   Angela Mcconnell was previously seen in the [facility] clinic due to a family history of cancer and concerns regarding a hereditary predisposition to cancer. Please refer to our prior cancer genetics clinic note for more information regarding our discussion, assessment and recommendations, at the time. Angela Mcconnell recent genetic test results were disclosed to her, as were recommendations warranted by these results. These results and recommendations are discussed in more detail below.  CANCER HISTORY:  Oncology History   No history exists.    FAMILY HISTORY:  We obtained a detailed, 4-generation family history.  Significant diagnoses are listed below:        Family History  Problem Relation Age of Onset   Hypertension Mother     Heart disease Mother     Stroke Mother     Obesity Mother     Hypertension Father     Lung cancer Father 72        smoked   Breast cancer Father 16 - 55        recurred in 74s   Thyroid cancer Sister 27 - 40   Ovarian cancer Sister 47   Heart attack Brother 80   Cancer Maternal Aunt          unknown type   Cancer Maternal Aunt          unknown type   Cancer Maternal Uncle          unknown type   Cancer Paternal Aunt          unknown type   Cancer Paternal Aunt          unknown type   Cancer Paternal Grandfather          unknown type   Cancer Half-Sister          paternal half-sister, unknown type of cancer   Cancer Half-Brother          paternal half-brother, blood cancer             Angela Mcconnell has two full sisters. One was diagnosed with thyroid cancer in her 74s and one was diagnosed with ovarian cancer at age 2. Angela Mcconnell has 7 maternal aunts and 2 had a history of an unknown type of cancer, they are deceased. She has 3 maternal uncles and one had a history of an unknown type of cancer, he is deceased.    Angela Mcconnell father was diagnosed with female breast cancer in his 44s and his breast cancer recurred in his 53s.  He was also diagnosed with lung cancer at age 5, he died at age 59. She has 2 paternal aunts and both had a history of an unknown type of cancer, they are deceased. Her paternal grandfather was diagnosed with an unknown type of cancer, he is deceased.    Angela Mcconnell is unaware of previous family history of genetic testing for hereditary cancer risks. There is no reported Ashkenazi Jewish ancestry.    GENETIC TEST RESULTS:  The [facility] CancerNext-Expanded Panel found no pathogenic mutations.   The CancerNext-Expanded gene panel offered by [facility] and includes sequencing, rearrangement, and RNA analysis for the following 77 genes: AIP, ALK, APC, ATM, AXIN2, BAP1, BARD1, BLM, BMPR1A, BRCA1, BRCA2, BRIP1, CDC73, CDH1, CDK4, CDKN1B, CDKN2A, CHEK2, CTNNA1, DICER1, FANCC, FH, FLCN, GALNT12, KIF1B, LZTR1, MAX, MEN1, MET, MLH1, MSH2, MSH3, MSH6, MUTYH, NBN, NF1, NF2, NTHL1, PALB2, PHOX2B, PMS2, POT1, PRKAR1A, PTCH1, PTEN, RAD51C, RAD51D, RB1, RECQL, RET, SDHA, SDHAF2,  SDHB, SDHC, SDHD, SMAD4, SMARCA4, SMARCB1, SMARCE1, STK11, SUFU, TMEM127, TP53, TSC1, TSC2, VHL and XRCC2 (sequencing and deletion/duplication); EGFR, EGLN1, HOXB13, KIT, MITF, PDGFRA, POLD1, and POLE (sequencing only); EPCAM and GREM1 (deletion/duplication only).   The test report has been scanned into EPIC and is located under the Molecular Pathology section of the Results Review tab.  A portion of the result report is included below for reference. Genetic testing reported out on 12/17/2022.       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  At this time, it is unknown if this variant is associated with an increased risk for cancer or if it is benign, but most uncertain variants are reclassified to benign. It should not be used to make medical management decisions. With time, we suspect the laboratory will determine the significance of this variant, if any. If the laboratory  reclassifies this variant, we will attempt to contact Ms. Lamaster to discuss it further.   Even though a pathogenic variant was not identified, possible explanations for the cancer in the family may include: There may be no hereditary risk for cancer in the family. The cancers in her family may be due to other genetic or environmental factors. There may be a gene mutation in one of these genes that current testing methods cannot detect, but that chance is small. There could be another gene that has not yet been discovered, or that we have not yet tested, that is responsible for the cancer diagnoses in the family.  It is also possible there is a hereditary cause for the cancer in the family that Angela Mcconnell did not inherit.  Therefore, it is important to remain in touch with cancer genetics in the future so that we can continue to offer Angela Mcconnell the most up to date genetic testing.   ADDITIONAL GENETIC TESTING:  We discussed with Angela Mcconnell that her genetic testing was fairly extensive.  If there are genes identified to increase cancer risk that can be analyzed in the future, we would be happy to discuss and coordinate this testing at that time.    CANCER SCREENING RECOMMENDATIONS:  Angela Mcconnell test result is considered negative (normal).  This means that we have not identified a hereditary cause for her family history of cancer at this time.   An individual's cancer risk and medical management are not determined by genetic test results alone. Overall cancer risk assessment incorporates additional factors, including personal medical history, family history, and any available genetic information that may result in a personalized plan for cancer prevention and surveillance. Therefore, it is recommended she continue to follow the cancer management and screening guidelines provided by her primary healthcare provider. Angela Mcconnell risk score is 5.6%.   RECOMMENDATIONS FOR FAMILY  MEMBERS:   Since she did not inherit a mutation in a cancer predisposition gene included on this panel, her children could not have inherited a mutation from her in one of these genes. Individuals in this family might be at some increased risk of developing cancer, over the general population risk, due to the family history of cancer. We recommend women in this family have a yearly mammogram beginning at age 70, or 16 years younger than the earliest onset of cancer, an annual clinical breast exam, and perform monthly breast self-exams.  Other members of the family may still carry a pathogenic variant in one of these genes that Ms. Nieland did not inherit. Based on  the family history, we recommend her sister who was diagnosed with ovarian cancer have genetic counseling and testing.  We do not recommend familial testing for the MSH3 variant of uncertain significance (VUS).  FOLLOW-UP:  Cancer genetics is a rapidly advancing field and it is possible that new genetic tests will be appropriate for her and/or her family members in the future. We encouraged her to remain in contact with cancer genetics on an annual basis so we can update her personal and family histories and let her know of advances in cancer genetics that may benefit this family.   Our contact number was provided. Ms. Noland questions were answered to her satisfaction, and she knows she is welcome to call us at anytime with additional questions or concerns.   Lucille Passy, MS, [facility] Genetic Counselor [email or web address] (P) [phone]

## 2022-12-24 ENCOUNTER — Encounter: Payer: Self-pay | Admitting: Genetic Counselor

## 2023-01-02 ENCOUNTER — Ambulatory Visit (INDEPENDENT_AMBULATORY_CARE_PROVIDER_SITE_OTHER): Payer: No Typology Code available for payment source | Admitting: Family Medicine

## 2023-01-02 VITALS — BP 123/77 | HR 80 | Temp 98.4°F | Ht 63.0 in

## 2023-01-02 DIAGNOSIS — Z6823 Body mass index (BMI) 23.0-23.9, adult: Secondary | ICD-10-CM

## 2023-01-02 DIAGNOSIS — E559 Vitamin D deficiency, unspecified: Secondary | ICD-10-CM

## 2023-01-02 DIAGNOSIS — Z6824 Body mass index (BMI) 24.0-24.9, adult: Secondary | ICD-10-CM | POA: Insufficient documentation

## 2023-01-02 DIAGNOSIS — R632 Polyphagia: Secondary | ICD-10-CM | POA: Diagnosis not present

## 2023-01-02 DIAGNOSIS — E669 Obesity, unspecified: Secondary | ICD-10-CM | POA: Diagnosis not present

## 2023-01-02 DIAGNOSIS — E7849 Other hyperlipidemia: Secondary | ICD-10-CM | POA: Diagnosis not present

## 2023-01-02 MED ORDER — VITAMIN D (ERGOCALCIFEROL) 1.25 MG (50000 UNIT) PO CAPS: 50000.0000 [IU] | ORAL_CAPSULE | ORAL | 0 refills | Status: DC

## 2023-01-02 MED ORDER — SEMAGLUTIDE-WEIGHT MANAGEMENT 1 MG/0.5ML ~~LOC~~ SOAJ: 1.0000 mg | SUBCUTANEOUS | 0 refills | Status: DC

## 2023-01-03 LAB — LIPID PANEL WITH LDL/HDL RATIO
Cholesterol, Total: 205 mg/dL — ABNORMAL HIGH (ref 100–199)
HDL: 75 mg/dL (ref >39)
LDL Chol Calc (NIH): 117 mg/dL — ABNORMAL HIGH (ref 0–99)
LDL/HDL Ratio: 1.6 ratio (ref 0.0–3.2)
Triglycerides: 70 mg/dL (ref 0–149)
VLDL Cholesterol Cal: 13 mg/dL (ref 5–40)

## 2023-01-03 LAB — CMP14+EGFR
ALT: 8 IU/L (ref 0–32)
AST: 16 IU/L (ref 0–40)
Albumin/Globulin Ratio: 2 (ref 1.2–2.2)
Albumin: 5.2 g/dL — ABNORMAL HIGH (ref 3.8–4.9)
Alkaline Phosphatase: 101 IU/L (ref 44–121)
BUN/Creatinine Ratio: 11 — ABNORMAL LOW (ref 12–28)
BUN: 10 mg/dL (ref 8–27)
Bilirubin Total: 0.4 mg/dL (ref 0.0–1.2)
CO2: 22 mmol/L (ref 20–29)
Calcium: 10 mg/dL (ref 8.7–10.3)
Chloride: 103 mmol/L (ref 96–106)
Creatinine, Ser: 0.95 mg/dL (ref 0.57–1.00)
Globulin, Total: 2.6 g/dL (ref 1.5–4.5)
Glucose: 79 mg/dL (ref 70–99)
Potassium: 4 mmol/L (ref 3.5–5.2)
Sodium: 142 mmol/L (ref 134–144)
Total Protein: 7.8 g/dL (ref 6.0–8.5)
eGFR: 69 mL/min/{1.73_m2} (ref >59)

## 2023-01-03 LAB — HEMOGLOBIN A1C
Est. average glucose Bld gHb Est-mCnc: 94 mg/dL
Hgb A1c MFr Bld: 4.9 % (ref 4.8–5.6)

## 2023-01-03 LAB — INSULIN, RANDOM: INSULIN: 7.2 u[IU]/mL (ref 2.6–24.9)

## 2023-01-03 LAB — VITAMIN D 25 HYDROXY (VIT D DEFICIENCY, FRACTURES): Vit D, 25-Hydroxy: 30 ng/mL (ref 30.0–100.0)

## 2023-01-06 ENCOUNTER — Telehealth (INDEPENDENT_AMBULATORY_CARE_PROVIDER_SITE_OTHER): Payer: Self-pay | Admitting: Family Medicine

## 2023-01-06 NOTE — Telephone Encounter (Signed)
PA submitted for [facility] via CoverMyMeds.  Haydee Mullendore (Key: [id number])  form thumbnail Your information has been submitted to [place]. To check for an updated outcome later, reopen this PA request from your dashboard.  If [employer] has not responded to your request within 24 hours, contact [place] at [phone]. If you think there may be a problem with your PA request, use our live chat feature at the bottom right.

## 2023-01-06 NOTE — Telephone Encounter (Signed)
RE: Edwena Bunde approved your request for coverage of Wegovy (semaglutide injection) 1 mg/0.5 mL.  Dear Beacher May Straker: We're pleased to let you know that we've approved your or your doctor's request for coverage for Wegovy (semaglutide injection) 1 mg/0.5 mL. You can now fill your prescription, and it will be covered according to your plan. As long as you remain covered by your prescription drug plan and there are no changes to your plan benefits, this request is approved from 01/06/2023 to 01/07/2024. When this approval expires, please speak to your doctor about your treatment. Sincerely,  [facility] [employer] cc: Dr. Dennard Nip  If you have questions, we want to help. Call the number on your prescription ID card or in your plan materials to speak with a representative

## 2023-01-14 NOTE — Progress Notes (Unsigned)
Chief Complaint:   OBESITY Angela Mcconnell is here to discuss her progress with her obesity treatment plan along with follow-up of her obesity related diagnoses. Angela Mcconnell is on practicing portion control and making smarter food choices, such as increasing vegetables and decreasing simple carbohydrates and states she is following her eating plan approximately 80% of the time. Angela Mcconnell states she is doing 0 minutes 0 times per week.  Today's visit was #: 21 Starting weight: 186 lbs Starting date: 06/01/2020 Today's weight: 133 lbs Today's date: 01/02/2023 Total lbs lost to date: 53 Total lbs lost since last in-office visit: 0  Interim History: Angela Mcconnell's last visit was approximately 3 months ago.  She had foot surgery and the meanwhile which naturally has limited her exercise.  She has been off [facility] for much of that time.  Subjective:   1. Other hyperlipidemia Angela Mcconnell is working on her diet and she is not on statin.  She is due to have labs checked.  2. Vitamin D deficiency Angela Mcconnell is off of vitamin D and she notes increased fatigue.  She is due to have labs checked.  3. Polyphagia Angela Mcconnell did well on Wegovy in the past.  She recently restarted and needs a refill soon.  She is due for labs.  Assessment/Plan:   1. Other hyperlipidemia We will check labs today. Angela Mcconnell will continue to decrease cholesterol in her diet.  - Lipid Panel With LDL/HDL Ratio  2. Vitamin D deficiency We will check labs today, and we will refill prescription vitamin D 50,000 units weekly for 1 month.  - Vitamin D, Ergocalciferol, (DRISDOL) 1.25 MG (50000 UNIT) CAPS capsule; Take 1 capsule (50,000 Units total) by mouth every 7 (seven) days.  Dispense: 4 capsule; Refill: 0 - VITAMIN D 25 Hydroxy (Vit-D Deficiency, Fractures)  3. Polyphagia We will check labs today, and we will refill Wegovy for 1 month.  - CMP14+EGFR - Insulin, random - Hemoglobin A1c - Semaglutide-Weight Management 1 MG/0.5ML SOAJ; Inject  1 mg into the skin once a week.  Dispense: 6 mL; Refill: 0  4. BMI 23.0-23.9, adult  5. Obesity, Beginning BMI 32.95 Angela Mcconnell is currently in the action stage of change. As such, her goal is to continue with weight loss efforts. She has agreed to practicing portion control and making smarter food choices, such as increasing vegetables and decreasing simple carbohydrates.   Behavioral modification strategies: increasing lean protein intake, decreasing simple carbohydrates, and increasing vegetables.  Angela Mcconnell has agreed to follow-up with our clinic in 4 weeks. She was informed of the importance of frequent follow-up visits to maximize her success with intensive lifestyle modifications for her multiple health conditions.   Angela Mcconnell was informed we would discuss her lab results at her next visit unless there is a critical issue that needs to be addressed sooner. Angela Mcconnell agreed to keep her next visit at the agreed upon time to discuss these results.  Objective:   Blood pressure 123/77, pulse 80, temperature 98.4 F (36.9 C), height 5' 3"$  (1.6 m), SpO2 97 %. Body mass index is 22.64 kg/m.  General: Cooperative, alert, well developed, in no acute distress. HEENT: Conjunctivae and lids unremarkable. Cardiovascular: Regular rhythm.  Lungs: Normal work of breathing. Neurologic: No focal deficits.   Lab Results  Component Value Date   CREATININE 0.95 01/02/2023   BUN 10 01/02/2023   NA 142 01/02/2023   K 4.0 01/02/2023   CL 103 01/02/2023   CO2 22 01/02/2023   Lab Results  Component Value Date  ALT 8 01/02/2023   AST 16 01/02/2023   ALKPHOS 101 01/02/2023   BILITOT 0.4 01/02/2023   Lab Results  Component Value Date   HGBA1C 4.9 01/02/2023   HGBA1C 5.4 06/01/2020   Lab Results  Component Value Date   INSULIN 7.2 01/02/2023   INSULIN 10.8 06/01/2020   Lab Results  Component Value Date   TSH 1.070 06/01/2020   Lab Results  Component Value Date   CHOL 205 (H) 01/02/2023    HDL 75 01/02/2023   LDLCALC 117 (H) 01/02/2023   TRIG 70 01/02/2023   CHOLHDL 3.3 02/07/2020   Lab Results  Component Value Date   VD25OH 30.0 01/02/2023   VD25OH 39.6 06/01/2020   Lab Results  Component Value Date   WBC 8.6 06/01/2020   HGB 12.8 06/01/2020   HCT 40.1 06/01/2020   MCV 94 06/01/2020   PLT 319 06/01/2020   Lab Results  Component Value Date   IRON 95 06/01/2020   TIBC 315 06/01/2020   FERRITIN 228 (H) 06/01/2020   Attestation Statements:   Reviewed by clinician on day of visit: allergies, medications, problem list, medical history, surgical history, family history, social history, and previous encounter notes.   I, Trixie Dredge, am acting as transcriptionist for Dennard Nip, MD.  I have reviewed the above documentation for accuracy and completeness, and I agree with the above. -  Dennard Nip, MD

## 2023-01-29 NOTE — Progress Notes (Signed)
TeleHealth Visit:  This visit was completed with telemedicine (audio/video) technology. Angela Mcconnell has verbally consented to this TeleHealth visit. The patient is located at home, the provider is located at home. The participants in this visit include the listed provider and patient. The visit was conducted today via MyChart video.  OBESITY Angela Mcconnell is here to discuss her progress with her obesity treatment plan along with follow-up of her obesity related diagnoses.   Today's visit was # 22 Starting weight: 186 lbs Starting date: 06/01/2020 Weight at last in office visit: 133 lbs on 01/02/23 Total weight loss: 53 lbs at last in office visit on 01/02/23. Today's reported weight: 131 lbs   Nutrition Plan: practicing portion control and making smarter food choices, such as increasing vegetables and decreasing simple carbohydrates.  Current exercise: Treadmill (30 minutes)/resistance bands 3 times per week  Interim History:  Eating 2 meals per day.  Frequently skips lunch.  She is nearly at her goal weight.  Her goal is 125-130 pounds. She is eating plenty of fruits and vegetables but likely not getting in enough protein.  She had foot surgery a few months ago and has recently resumed exercise. She reports feeling much better since she has lost weight.  Endurance has improved.  Pharmacotherapy: Angela Mcconnell is on Wegovy 1.0 mg SQ weekly Adverse side effects: None Hunger is well controlled.  Cravings are well controlled.  Assessment/Plan:  We discussed recent lab results in depth.  1. Hyperlipidemia LDL is not at goal, but much improved.  Most recent LDL was 117, down from 147. Medication(s): None Lab Results  Component Value Date   CHOL 205 (H) 01/02/2023   HDL 75 01/02/2023   LDLCALC 117 (H) 01/02/2023   TRIG 70 01/02/2023   CHOLHDL 3.3 02/07/2020   CHOLHDL 3.2 10/23/2009   Lab Results  Component Value Date   ALT 8 01/02/2023   AST 16 01/02/2023   ALKPHOS 101 01/02/2023    BILITOT 0.4 01/02/2023   The 10-year ASCVD risk score (Arnett DK, et al., 2019) is: 3.9%   Values used to calculate the score:     Age: 62 years     Sex: Female     Is Non-Hispanic African American: Yes     Diabetic: No     Tobacco smoker: No     Systolic Blood Pressure: 123 mmHg     Is BP treated: No     HDL Cholesterol: 75 mg/dL     Total Cholesterol: 205 mg/dL  Plan: No statin needed. Decrease saturated fat. Continue adequate fiber.  2. Vitamin D Deficiency Vitamin D is not at goal of 50.  Most recent vitamin D level was 30 on 01/02/2023.  Level has decreased from 39.. She is on OTC vitamin D3 2000 IU daily. Lab Results  Component Value Date   VD25OH 30.0 01/02/2023   VD25OH 39.6 06/01/2020    Plan: Increase dose of over-the-counter vitamin D to 4000 IU daily.   3. Generalized Obesity: Current BMI 22 Pharmacotherapy Plan Continue and refill  Wegovy 1.0 mg SQ weekly Angela Mcconnell is currently in the action stage of change. As such, her goal is to continue with weight loss efforts.  She has agreed to practicing portion control and making smarter food choices, such as increasing vegetables and decreasing simple carbohydrates.  1.  Encouraged her to count protein-goal is 80 g/day.  Exercise goals:  as is  Behavioral modification strategies: increasing lean protein intake, meal planning , and planning for success.  Angela Mcconnell has  agreed to follow-up with our clinic in 6 weeks.   No orders of the defined types were placed in this encounter.   Medications Discontinued During This Encounter  Medication Reason   Vitamin D, Ergocalciferol, (DRISDOL) 1.25 MG (50000 UNIT) CAPS capsule Patient Preference     No orders of the defined types were placed in this encounter.     Objective:   VITALS: Per patient if applicable, see vitals. GENERAL: Alert and in no acute distress. CARDIOPULMONARY: No increased WOB. Speaking in clear sentences.  PSYCH: Pleasant and cooperative. Speech  normal rate and rhythm. Affect is appropriate. Insight and judgement are appropriate. Attention is focused, linear, and appropriate.  NEURO: Oriented as arrived to appointment on time with no prompting.   Attestation Statements:   Reviewed by clinician on day of visit: allergies, medications, problem list, medical history, surgical history, family history, social history, and previous encounter notes.  This was prepared with the assistance of Engineer, civil (consulting).  Occasional wrong-word or sound-a-like substitutions may have occurred due to the inherent limitations of voice recognition software.

## 2023-01-30 ENCOUNTER — Encounter (INDEPENDENT_AMBULATORY_CARE_PROVIDER_SITE_OTHER): Payer: Self-pay | Admitting: Family Medicine

## 2023-01-30 ENCOUNTER — Telehealth (INDEPENDENT_AMBULATORY_CARE_PROVIDER_SITE_OTHER): Payer: No Typology Code available for payment source | Admitting: Family Medicine

## 2023-01-30 DIAGNOSIS — E559 Vitamin D deficiency, unspecified: Secondary | ICD-10-CM | POA: Diagnosis not present

## 2023-01-30 DIAGNOSIS — E7849 Other hyperlipidemia: Secondary | ICD-10-CM

## 2023-01-30 DIAGNOSIS — Z6822 Body mass index (BMI) 22.0-22.9, adult: Secondary | ICD-10-CM | POA: Diagnosis not present

## 2023-01-30 DIAGNOSIS — E785 Hyperlipidemia, unspecified: Secondary | ICD-10-CM | POA: Diagnosis not present

## 2023-01-30 DIAGNOSIS — E669 Obesity, unspecified: Secondary | ICD-10-CM

## 2023-01-30 MED ORDER — VITAMIN D 50 MCG (2000 UT) PO CAPS: 2.0000 | ORAL_CAPSULE | Freq: Every day | ORAL | 0 refills | Status: AC

## 2023-02-13 ENCOUNTER — Ambulatory Visit: Payer: No Typology Code available for payment source | Admitting: Allergy

## 2023-02-13 ENCOUNTER — Other Ambulatory Visit: Payer: Self-pay

## 2023-02-13 ENCOUNTER — Encounter: Payer: Self-pay | Admitting: Allergy

## 2023-02-13 VITALS — BP 120/86 | HR 87 | Temp 98.3°F | Resp 18 | Ht 63.0 in | Wt 135.9 lb

## 2023-02-13 DIAGNOSIS — T7800XD Anaphylactic reaction due to unspecified food, subsequent encounter: Secondary | ICD-10-CM

## 2023-02-13 DIAGNOSIS — H5789 Other specified disorders of eye and adnexa: Secondary | ICD-10-CM

## 2023-02-13 DIAGNOSIS — T7800XA Anaphylactic reaction due to unspecified food, initial encounter: Secondary | ICD-10-CM

## 2023-02-13 DIAGNOSIS — J3089 Other allergic rhinitis: Secondary | ICD-10-CM | POA: Diagnosis not present

## 2023-02-13 DIAGNOSIS — H1013 Acute atopic conjunctivitis, bilateral: Secondary | ICD-10-CM | POA: Diagnosis not present

## 2023-02-13 DIAGNOSIS — H15101 Unspecified episcleritis, right eye: Secondary | ICD-10-CM | POA: Diagnosis not present

## 2023-02-13 MED ORDER — EPINEPHRINE 0.3 MG/0.3ML IJ SOAJ: 0.3000 mg | INTRAMUSCULAR | 2 refills | Status: AC | PRN

## 2023-02-13 MED ORDER — LEVOCETIRIZINE DIHYDROCHLORIDE 5 MG PO TABS: 5.0000 mg | ORAL_TABLET | Freq: Every evening | ORAL | 1 refills | Status: AC

## 2023-02-13 MED ORDER — RYALTRIS 665-25 MCG/ACT NA SUSP: NASAL | 5 refills | Status: DC

## 2023-02-13 NOTE — Progress Notes (Signed)
New Patient Note  RE: Angela Mcconnell MRN: [id number] DOB: 05/25/1961 Date of Office Visit: 02/13/2023   Primary care provider: none at this time  Chief Complaint: eye issue  History of present illness: Angela Mcconnell is a 62 y.o. female presenting today for evaluation of possible autoimmune issue causing eye symptoms.   She sees an eye doctor at [facility] who recommended she see an allergist for recurrent episcleritis.  She states now every other week the right eye is getting red.  There is no identifiable triggers for the redness.  It is not painful but she can feel when the eye is red.  She does have 2 different types of eyedrops 1 that is a steroid eyedrop to use from her eye doctor.  She denies having any autoimmune disease.  She does not have any other symptoms concerning for autoimmune disease.    She has had epipen in the past.  What she currently has is exppired.  She states she avoids shellfish and egg as they have caused her to have mouth itch and blistery feeling.   She can eat baked egg products without issue.   She can eat fish without issue.    She does report environmental allergy symptoms with itchy watery eyes, runny stuffy nose and sneezing.  She does not currently use any allergy based medications.  Review of systems: Review of Systems  Constitutional: Negative.   HENT: Negative.    Eyes:  Positive for redness.  Respiratory: Negative.    Cardiovascular: Negative.   Gastrointestinal: Negative.   Musculoskeletal: Negative.   Skin: Negative.   Allergic/Immunologic: Negative.   Neurological: Negative.     All other systems negative unless noted above in HPI  Past medical history: Past Medical History:  Diagnosis Date   Anemia    Arthritis    Asthma    Blood transfusion without reported diagnosis 11/2018   Chest pain    Depression    Fibroid    GERD (gastroesophageal reflux disease)    Insomnia    Joint pain    Multiple food allergies     Osteoarthritis    Palpitations    Shortness of breath    Vitamin D deficiency     Past surgical history: Past Surgical History:  Procedure Laterality Date   ABDOMINAL HYSTERECTOMY     [facility]   CORONARY ANGIOGRAM  Nov 2010   Normal coronary arteries   ESOPHAGEAL MANOMETRY N/A 08/12/2018   Procedure: ESOPHAGEAL MANOMETRY (EM);  Surgeon: Clarene Essex, MD;  Location: [facility];  Service: Endoscopy;  Laterality: N/A;   HERNIA REPAIR     INSERTION OF MESH N/A 10/14/2018   Procedure: INSERTION OF MESH;  Surgeon: Michael Boston, MD;  Location: [facility];  Service: General;  Laterality: N/A;   KNEE SURGERY     OOPHORECTOMY      Family history:  Family History  Problem Relation Age of Onset   Hypertension Mother    Heart disease Mother    Stroke Mother    Obesity Mother    Hypertension Father    Lung cancer Father 93       smoked   Breast cancer Father 63 - 39       recurred in 1s   Thyroid cancer Sister 55 - 76   Ovarian cancer Sister 29   Heart attack Brother 41   Cancer Maternal Aunt        unknown type   Cancer Maternal Aunt  unknown type   Cancer Maternal Uncle        unknown type   Cancer Paternal Aunt        unknown type   Cancer Paternal Aunt        unknown type   Cancer Paternal Grandfather        unknown type   Cancer Half-Sister        paternal half-sister, unknown type of cancer   Cancer Half-Brother        paternal half-brother, blood cancer    Social history: Lives in the home with carpeting in the bedroom with electric heating and central cooling.  Also.  There is no concern for water damage, mildew or roaches in the home.  She is an Clinical cytogeneticist.  She denies a smoking history   Medication List: Current Outpatient Medications  Medication Sig Dispense Refill   Cholecalciferol (VITAMIN D) 50 MCG (2000 UT) CAPS Take 2 capsules (4,000 Units total) by mouth daily. 30 capsule 0   EPINEPHrine 0.3 mg/0.3 mL IJ SOAJ injection Inject 0.3 mg  into the muscle as needed for anaphylaxis. 2 each 2   levocetirizine (XYZAL) 5 MG tablet Take 1 tablet (5 mg total) by mouth every evening. 90 tablet 1   Multiple Vitamin (MULTIVITAMIN) tablet Take 2 tablets by mouth daily.     Multiple Vitamins-Minerals (AIRBORNE PO) Take by mouth.     Olopatadine-Mometasone (RYALTRIS) [id number] MCG/ACT SUSP two sprays per nostril 1-2 times daily as needed for runny or stuffy nose. 29 g 5   OVER THE COUNTER MEDICATION [place] Vitamin     Semaglutide-Weight Management 1 MG/0.5ML SOAJ Inject 1 mg into the skin once a week. 6 mL 0   traZODone (DESYREL) 100 MG tablet Take 100 mg by mouth at bedtime.     Bromfenac Sodium 0.07 % SOLN INSTILL 1 DROP IN THE RIGHT EYE THREE TIMES A DAY     Difluprednate 0.05 % EMUL INSTILL 1 DROP IN RIGHT EYE FOUR TIMES DAILY FOR 7 DAYS     hydrOXYzine (ATARAX/VISTARIL) 25 MG tablet TAKE 1 TABLET BY MOUTH AT BEDTIME AS NEEDED. (Patient not taking: Reported on 02/13/2023) 30 tablet 0   No current facility-administered medications for this visit.    Known medication allergies: Allergies  Allergen Reactions   Sulfa Antibiotics Itching   Tape Itching    Causes burn marks    Clotrimazole     Made rash worse   Egg-Derived Products Hives    mouth and throat itching   Hydrocodone Itching   Shellfish Allergy Hives    Mouth and throat itching     Physical examination: Blood pressure 120/86, pulse 87, temperature 98.3 F (36.8 C), resp. rate 18, height 5\' 3"  (1.6 m), weight 135 lb 14.4 oz (61.6 kg), SpO2 99 %.  General: Alert, interactive, in no acute distress. HEENT: PERRLA, right lateral sclera is injected, TMs pearly gray, turbinates non-edematous without discharge, post-pharynx non erythematous. Neck: Supple without lymphadenopathy. Lungs: Clear to auscultation without wheezing, rhonchi or rales. {no increased work of breathing. CV: Normal S1, S2 without murmurs. Abdomen: Nondistended, nontender. Skin: Warm and dry, without  lesions or rashes. Extremities:  No clubbing, cyanosis or edema. Neuro:   Grossly intact.  Diagnositics/Labs:  Allergy testing:   Airborne Adult Perc - 02/13/23 1400     Time Antigen Placed 1410    Allergen Manufacturer Lavella Hammock    Location Back    Number of Test 59    1. Control-Buffer 50% Glycerol  Negative    2. Control-Histamine 1 mg/ml 2+    3. Albumin saline Negative    4. [place] 2+    5. [place] 2+    6. Johnson Negative    7. [place] Blue 3+    8. Meadow Fescue 3+    9. Perennial Rye Negative    10. Sweet Vernal Negative    11. Timothy 4+    12. Cocklebur Negative    13. Burweed Marshelder Negative    14. Ragweed, short Negative    15. Ragweed, Giant Negative    16. Plantain,  English 2+    17. Lamb's Quarters 2+    18. Sheep Sorrell Negative    19. Rough Pigweed Negative    20. Marsh Elder, Rough Negative    21. Mugwort, Common 2+    22. Ash mix 2+    23. Birch mix Negative    24. Beech American Negative    25. Box, Elder Negative    26. Cedar, red Negative    27. Cottonwood, [place] Negative    28. Elm mix Negative    29. Hickory Negative    30. Maple mix Negative    31. Oak, [place] mix Negative    32. Pecan Pollen 2+    33. Pine mix Negative    34. Sycamore Eastern Negative    35. Suffern, Black Pollen Negative    36. Alternaria alternata Negative    37. Cladosporium Herbarum Negative    38. Aspergillus mix Negative    39. Penicillium mix Negative    40. Bipolaris sorokiniana (Helminthosporium) Negative    41. Drechslera spicifera (Curvularia) Negative    42. Mucor plumbeus Negative    43. Fusarium moniliforme Negative    44. Aureobasidium pullulans (pullulara) Negative    45. Rhizopus oryzae Negative    46. Botrytis cinera Negative    47. Epicoccum nigrum Negative    48. Phoma betae Negative    49. Candida Albicans Negative    50. Trichophyton mentagrophytes Negative    51. Mite, D Farinae  5,000 AU/ml 2+    52. Mite, D Pteronyssinus  5,000  AU/ml Negative    53. Cat Hair 10,000 BAU/ml Negative    54.  Dog Epithelia Negative    55. Mixed Feathers Negative    56. Horse Epithelia Negative    57. Cockroach, German Negative    58. Mouse Negative    59. Tobacco Leaf Negative             Food Perc - 02/13/23 1400       Test Information   Time Antigen Placed --    Allergen Manufacturer --    Location --    Number of allergen test --      Comments   Comments OMMITTED             Food Adult Perc - 02/13/23 1400     Time Antigen Placed 1410    Allergen Manufacturer Lavella Hammock    Location Back    Number of allergen test 6     Control-buffer 50% Glycerol Negative    Control-Histamine 1 mg/ml 2+    1. Peanut Omitted    2. Soybean Omitted    3. Wheat Omitted    4. Sesame Omitted    5. Milk, cow Omitted    6. Egg White, Chicken Negative    7. Casein Omitted    8. Shellfish Mix Omitted    9. Fish Mix Omitted  10. Cashew Omitted    11. Joaquin    12. Advanced [employer] Devices Omitted    13. Joes    14. Hazelnut Omitted    15. [place] nut Omitted    16. Coconut Omitted    17. Pistachio Omitted    18. Catfish Omitted    19. Bass Omitted    20. Trout Omitted    21. Virden    22. Hutchins    23. Flounder Omitted    24. Codfish Omitted    25. Shrimp Negative    26. Crab Negative    27. Lobster Negative    28. Oyster Negative    29. Scallops Negative    30. Barley Omitted    31. Oat  Omitted    32. Rye  Omitted    33. Hops Omitted    34. Rice Omitted    35. Cottonseed Omitted    36. Saccharomyces Cerevisiae  Omitted    49. Pork Omitted    38. [place] Meat Omitted    39. Chicken Meat Omitted    40. Beef Omitted    41. Lamb Omitted    42. Tomato Omitted    43. White Potato Omitted    44. Sweet Potato Omitted    45. Pea, Green/English Omitted    46. Navy Bean Omitted    47. Mushrooms Omitted    48. Avocado Omitted    49. Onion Omitted    50. Cabbage Omitted    51. Carrots  Omitted    52. Celery Omitted    53. Corn Omitted    54. Cucumber Omitted    55. Grape (White seedless) Omitted    56. Orange  Omitted    57. Banana Omitted    58. Apple Omitted    59. Peach Omitted    60. Strawberry Omitted    61. Cantaloupe Omitted    62. Watermelon Omitted    63. Pineapple Omitted    64. Chocolate/Cacao bean Omitted    65. Karaya Gum Negative    66. Acacia (Arabic Gum) Omitted    81. Cinnamon Omitted    68. Nutmeg Omitted    69. Ginger Omitted    70. Garlic Omitted    71. Pepper, black Omitted    34. Mustard Omitted             Allergy testing results were read and interpreted by provider, documented by clinical staff.   Assessment and plan: Red eye/Recurrent episcleritis Environmental allergy Food allergy  - Concern for possible autoimmune issue causing recurrent episcleritis.   Will start with autoimmune screening lab with rheumatoid marker, ANA and if positive it will have  reflex test perform for autoimmune antibodies.   If positive will go ahead and refer to rheumatology for autoimmune evaluation.   There are rheumatologists that subspecialize in autoimmune eye diseases.   - Environmental testing today showed: grasses, weeds, trees, and dust mites. - Copy of test results provided.  - Avoidance measures provided. - Try the following allergy medications as needed:  Xyzal (levocetirizine) 5mg  tablet once daily.   If more effective than Zyrtec then can replace or rotate between the two.  Ryaltris (olopatadine/mometasone) two sprays per nostril 1-2 times daily as needed for runny or stuffy nose.  This is a combination spray with olopatadine (antihistamine) for drainage control and mometasone (steroid) for congestion control.   Sample provided.   - You can use an extra dose of the antihistamine, if  needed, for breakthrough symptoms.  - Consider nasal saline rinses 1-2 times daily to remove allergens from the nasal cavities as well as help with mucous  clearance (this is especially helpful to do before the nasal sprays are given) - Consider allergy shots as a means of long-term control. - Allergy shots "re-train" and "reset" the immune system to ignore environmental allergens and decrease the resulting immune response to those allergens (sneezing, itchy watery eyes, runny nose, nasal congestion, etc).    - Allergy shots improve symptoms in 75-85% of patients.   - Continue avoidance of stove-top egg and shellfish - Testing is negative to egg and shellfish.   Will obtain serum IgE levels for these foods to see if you can perform in-office food challenge(s) to see if you are no longer allergic - Have access to self-injectable epinephrine (Epipen) 0.3mg  at all times - Follow emergency action plan in case of allergic reaction  Follow-up 4-6 months or sooner if needed  I appreciate the opportunity to take part in Hermine's care. Please do not hesitate to contact me with questions.  Sincerely,   Prudy Feeler, MD Allergy/Immunology [facility]

## 2023-02-13 NOTE — Patient Instructions (Addendum)
Red eye/Recurrent episcleritis Environmental allergy Food allergy  - Concern for possible autoimmune issue causing recurrent episcleritis.   Will start with autoimmune screening lab with rheumatoid marker, ANA and if positive it will have  reflex test perform for autoimmune antibodies.   If positive will go ahead and refer to rheumatology for autoimmune evaluation.   There are rheumatologists that subspecialize in autoimmune eye diseases.   - Environmental testing today showed: grasses, weeds, trees, and dust mites. - Copy of test results provided.  - Avoidance measures provided. - Try the following allergy medications as needed:  Xyzal (levocetirizine) '5mg'$  tablet once daily.   If more effective than Zyrtec then can replace or rotate between the two.  Ryaltris (olopatadine/mometasone) two sprays per nostril 1-2 times daily as needed for runny or stuffy nose.  This is a combination spray with olopatadine (antihistamine) for drainage control and mometasone (steroid) for congestion control.   Sample provided.   - You can use an extra dose of the antihistamine, if needed, for breakthrough symptoms.  - Consider nasal saline rinses 1-2 times daily to remove allergens from the nasal cavities as well as help with mucous clearance (this is especially helpful to do before the nasal sprays are given) - Consider allergy shots as a means of long-term control. - Allergy shots "re-train" and "reset" the immune system to ignore environmental allergens and decrease the resulting immune response to those allergens (sneezing, itchy watery eyes, runny nose, nasal congestion, etc).    - Allergy shots improve symptoms in 75-85% of patients.   - Continue avoidance of stove-top egg and shellfish - Testing is negative to egg and shellfish.   Will obtain serum IgE levels for these foods to see if you can perform in-office food challenge(s) to see if you are no longer allergic - Have access to self-injectable epinephrine  (Epipen) 0.'3mg'$  at all times - Follow emergency action plan in case of allergic reaction  Follow-up 4-6 months or sooner if needed

## 2023-02-14 LAB — ANA W/REFLEX: Anti Nuclear Antibody (ANA): NEGATIVE

## 2023-02-18 LAB — ALLERGEN PROFILE, SHELLFISH
Clam IgE: <0.1 kU/L
F023-IgE Crab: <0.1 kU/L
F080-IgE Lobster: <0.1 kU/L
F290-IgE Oyster: <0.1 kU/L
Scallop IgE: <0.1 kU/L
Shrimp IgE: <0.1 kU/L

## 2023-02-18 LAB — ALLERGEN EGG WHITE F1: Egg White IgE: <0.1 kU/L

## 2023-02-18 LAB — IGE: IgE (Immunoglobulin E), Serum: 165 IU/mL (ref 6–495)

## 2023-03-13 ENCOUNTER — Ambulatory Visit (INDEPENDENT_AMBULATORY_CARE_PROVIDER_SITE_OTHER): Payer: No Typology Code available for payment source | Admitting: Family Medicine

## 2023-03-13 ENCOUNTER — Encounter (INDEPENDENT_AMBULATORY_CARE_PROVIDER_SITE_OTHER): Payer: Self-pay | Admitting: Family Medicine

## 2023-03-13 VITALS — BP 128/72 | HR 85 | Temp 97.5°F | Ht 63.0 in | Wt 134.0 lb

## 2023-03-13 DIAGNOSIS — Z6823 Body mass index (BMI) 23.0-23.9, adult: Secondary | ICD-10-CM

## 2023-03-13 DIAGNOSIS — R632 Polyphagia: Secondary | ICD-10-CM | POA: Diagnosis not present

## 2023-03-13 DIAGNOSIS — E559 Vitamin D deficiency, unspecified: Secondary | ICD-10-CM | POA: Diagnosis not present

## 2023-03-13 DIAGNOSIS — E669 Obesity, unspecified: Secondary | ICD-10-CM

## 2023-03-13 MED ORDER — SEMAGLUTIDE-WEIGHT MANAGEMENT 1 MG/0.5ML ~~LOC~~ SOAJ: 1.0000 mg | SUBCUTANEOUS | 0 refills | Status: DC

## 2023-03-17 NOTE — Progress Notes (Unsigned)
Chief Complaint:   OBESITY Angela Mcconnell is here to discuss her progress with her obesity treatment plan along with follow-up of her obesity related diagnoses. Angela Mcconnell is on practicing portion control and making smarter food choices, such as increasing vegetables and decreasing simple carbohydrates and states she is following her eating plan approximately 30% of the time. Angela Mcconnell states she is walking for 60 minutes 5 times per week.  Today's visit was #: 23 Starting weight: 186 lbs Starting date: 06/01/2020 Today's weight: 134 lbs Today's date: 03/13/2023 Total lbs lost to date: 52 Total lbs lost since last in-office visit: 0  Interim History: Angela Mcconnell is struggling to journal every day, especially caring for her grandkids and on the road a lot.  She is using protein supplementations.  She is doing more gardening for exercise.  Subjective:   1. Polyphagia Angela Mcconnell is on Wegovy and she is doing well although she notes some indigestion especially when she goes longer in between meals.   2. Vitamin D deficiency Angela Mcconnell is on a OTC vitamin D, and her last level was not at goal.  She is outside more now in her garden.   Assessment/Plan:   1. Polyphagia Angela Mcconnell will continue Wegovy, and we will refill for 1 month. She will work on having a protein snack before bed to help decrease indigestion.   - Semaglutide-Weight Management 1 MG/0.5ML SOAJ; Inject 1 mg into the skin once a week.  Dispense: 6 mL; Refill: 0  2. Vitamin D deficiency Angela Mcconnell will continue OTC Vitamin D 5,000 IU daily, and we will recheck labs in 2 months.   3. BMI 23.0-23.9, adult  4. Obesity, Beginning BMI 32.95 Angela Mcconnell is currently in the action stage of change. As such, her goal is to continue with weight loss efforts. She has agreed to keeping a food journal and adhering to recommended goals of 1200 calories and 75+ grams of protein daily.   Exercise goals: As is.   Behavioral modification strategies: increasing  lean protein intake and no skipping meals.  Angela Mcconnell has agreed to follow-up with our clinic in 4 weeks. She was informed of the importance of frequent follow-up visits to maximize her success with intensive lifestyle modifications for her multiple health conditions.   Objective:   Blood pressure 128/72, pulse 85, temperature (!) 97.5 F (36.4 C), height 5\' 3"  (1.6 m), weight 134 lb (60.8 kg), SpO2 98 %. Body mass index is 23.74 kg/m.  Lab Results  Component Value Date   CREATININE 0.95 01/02/2023   BUN 10 01/02/2023   NA 142 01/02/2023   K 4.0 01/02/2023   CL 103 01/02/2023   CO2 22 01/02/2023   Lab Results  Component Value Date   ALT 8 01/02/2023   AST 16 01/02/2023   ALKPHOS 101 01/02/2023   BILITOT 0.4 01/02/2023   Lab Results  Component Value Date   HGBA1C 4.9 01/02/2023   HGBA1C 5.4 06/01/2020   Lab Results  Component Value Date   INSULIN 7.2 01/02/2023   INSULIN 10.8 06/01/2020   Lab Results  Component Value Date   TSH 1.070 06/01/2020   Lab Results  Component Value Date   CHOL 205 (H) 01/02/2023   HDL 75 01/02/2023   LDLCALC 117 (H) 01/02/2023   TRIG 70 01/02/2023   CHOLHDL 3.3 02/07/2020   Lab Results  Component Value Date   VD25OH 30.0 01/02/2023   VD25OH 39.6 06/01/2020   Lab Results  Component Value Date   WBC 8.6 06/01/2020  HGB 12.8 06/01/2020   HCT 40.1 06/01/2020   MCV 94 06/01/2020   PLT 319 06/01/2020   Lab Results  Component Value Date   IRON 95 06/01/2020   TIBC 315 06/01/2020   FERRITIN 228 (H) 06/01/2020   Attestation Statements:   Reviewed by clinician on day of visit: allergies, medications, problem list, medical history, surgical history, family history, social history, and previous encounter notes.   I, Burt Knack, am acting as transcriptionist for Quillian Quince, MD.  I have reviewed the above documentation for accuracy and completeness, and I agree with the above. -  Quillian Quince, MD

## 2023-03-24 ENCOUNTER — Telehealth: Payer: Self-pay

## 2023-03-24 NOTE — Telephone Encounter (Signed)
PA request received via CMM for Ryaltris 665-25MCG/ACT suspension  PA has been submitted to [employer] and is pending determination  Key: [id number]

## 2023-03-26 NOTE — Telephone Encounter (Signed)
Patient Advocate Encounter  Received a fax from [facility] regarding Prior Authorization for Ryaltris 665-25MCG/ACT suspension.   Key: [id number]  Authorization has been DENIED due to

## 2023-03-27 ENCOUNTER — Encounter: Payer: No Typology Code available for payment source | Admitting: Family Medicine

## 2023-03-27 MED ORDER — AZELASTINE-FLUTICASONE 137-50 MCG/ACT NA SUSP: 1.0000 | Freq: Two times a day (BID) | NASAL | 5 refills | Status: DC

## 2023-03-27 NOTE — Telephone Encounter (Signed)
Dymista to replace Ryaltris. LVM letting patient know about change.

## 2023-04-07 ENCOUNTER — Ambulatory Visit: Payer: No Typology Code available for payment source | Admitting: Family Medicine

## 2023-04-07 ENCOUNTER — Encounter: Payer: Self-pay | Admitting: Family Medicine

## 2023-04-07 VITALS — BP 144/82 | HR 78 | Temp 98.1°F | Resp 16 | Ht 63.39 in | Wt 139.8 lb

## 2023-04-07 DIAGNOSIS — T7802XA Anaphylactic reaction due to shellfish (crustaceans), initial encounter: Secondary | ICD-10-CM

## 2023-04-07 DIAGNOSIS — T7802XD Anaphylactic reaction due to shellfish (crustaceans), subsequent encounter: Secondary | ICD-10-CM

## 2023-04-07 NOTE — Addendum Note (Signed)
Addended by: Kellie Simmering, Beya Tipps on: 04/07/2023 05:27 PM   Modules accepted: Orders

## 2023-04-07 NOTE — Progress Notes (Signed)
[address] [place] [address] Dept: [phone]  FOLLOW UP NOTE  Patient ID: Angela Mcconnell, female    DOB: 1961-03-19  Age: 62 y.o. MRN: [id number] Date of Office Visit: 04/07/2023  Assessment  Chief Complaint: Food/Drug Challenge (Shrimps)  HPI Angela Mcconnell is a 62 year old female who presents to the clinic for a follow-up visit and possible food challenge to shellfish.  Angela Mcconnell was last seen in this clinic on 01/15/2023 by Dr. Delorse Lek for evaluation of food allergy, environmental allergies, and frequent red eye or recurrent episcleritis.    At today's visit, Angela Mcconnell reports that Angela Mcconnell feels well overall with no cardiopulmonary, gastrointestinal, or integumentary symptoms.  Angela Mcconnell has not had any antihistamines over the last 3 days.  Her last food allergy skin prick testing was on 02/13/2023 was negative to egg and shellfish.  Her last lab food allergy evaluation was on 02/13/2023 and was negative to shellfish and eggs.  Her epinephrine autoinjector set is up-to-date.  Interestingly, Angela Mcconnell had positive dust mite testing on her environmental allergy skin testing on 02/13/2023.  Drug Allergies:  Allergies  Allergen Reactions   Sulfa Antibiotics Itching   Tape Itching    Causes burn marks    Clotrimazole     Made rash worse   Egg-Derived Products Hives    mouth and throat itching   Hydrocodone Itching   Shellfish Allergy Hives    Mouth and throat itching    Physical Exam: BP (!) 140/82   Pulse 74   Temp 98.1 F (36.7 C) (Temporal)   Resp 16   Ht 5' 3.39" (1.61 m)   Wt 139 lb 12.8 oz (63.4 kg)   SpO2 99%   BMI 24.46 kg/m    Physical Exam Vitals reviewed.  Constitutional:      Appearance: Normal appearance.  HENT:     Head: Normocephalic and atraumatic.     Right Ear: Tympanic membrane normal.     Left Ear: Tympanic membrane normal.     Nose:     Comments: Bilateral nares normal.  Pharynx normal.  Ears normal.  Eyes normal.    Mouth/Throat:     Pharynx:  Oropharynx is clear.  Eyes:     Conjunctiva/sclera: Conjunctivae normal.  Cardiovascular:     Rate and Rhythm: Normal rate and regular rhythm.     Heart sounds: Normal heart sounds. No murmur heard. Pulmonary:     Effort: Pulmonary effort is normal.     Breath sounds: Normal breath sounds.     Comments: Lungs clear to auscultation Musculoskeletal:        General: Normal range of motion.     Cervical back: Normal range of motion and neck supple.  Skin:    General: Skin is warm and dry.  Neurological:     Mental Status: Angela Mcconnell is alert and oriented to person, place, and time.  Psychiatric:        Mood and Affect: Mood normal.        Behavior: Behavior normal.        Thought Content: Thought content normal.        Judgment: Judgment normal.    Procedure note: Written consent obtained Open graded shrimp oral challenge: The patient was not able to tolerate the challenge today without adverse signs or symptoms. Vital signs were stable throughout the challenge and observation period. Angela Mcconnell received 2 doses separated by 15 minutes, each of which was separated by vitals and a brief physical exam. Angela Mcconnell received  the following doses: lip rub and 1 gm.  Angela Mcconnell reported overall pruritus immediately after ingesting the 1 g dose.  Angela Mcconnell denied shortness of breath, wheezing, coughing, throat closing, hives, abdominal pain, or diarrhea with this generalized pruritus.  Angela Mcconnell was given cetirizine 10 mg with resolution of generalized pruritus within 15 minutes.  Angela Mcconnell was monitored for over 60 minutes following cetirizine and resolution of symptoms. Total testing time: 102 minutes  Patient will continue to avoid shellfish and stovetop egg at this time.  Assessment and Plan: 1. 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 - Monitor for allergic symptoms such as rash, wheezing, diarrhea, swelling, and vomiting for the next 24 hours. If severe symptoms occur, treat with EpiPen injection and call 911. For less severe symptoms treat with Benadryl 50 mg every 4 hours and call the clinic.   Food allergy Continue to avoid shellfish and stovetop egg. Continue consuming products containing baked egg as you have tolerated this without adverse reaction. In case of an allergic reaction, take Benadryl 50 mg every 4 hours, and if life-threatening symptoms occur, inject with EpiPen 0.3 mg. Return to the clinic if you would like to have a stovetop egg food challenge.  Remember to stop antihistamines for 3 days before your food challenge appointment.  Call the clinic if you are interested in this option.  Call the clinic if this treatment plan is not working well for you  Follow up in 3 months or sooner if needed.   Return in about 3 months (around 07/08/2023), or if symptoms worsen or fail to improve.    Thank you for the opportunity to care for this patient.  Please do not hesitate to contact me with questions.  Thermon Leyland, FNP [facility]

## 2023-04-07 NOTE — Patient Instructions (Addendum)
In office oral shrimp challenge Angela Mcconnell was not able to tolerate the shrimp food challenge today at the office without adverse signs or symptoms of an allergic reaction. - Monitor for allergic symptoms such as rash, wheezing, diarrhea, swelling, and vomiting for the next 24 hours. If severe symptoms occur, treat with EpiPen injection and call 911. For less severe symptoms treat with Benadryl 50 mg every 4 hours and call the clinic.   Food allergy Continue to avoid shellfish and stovetop egg. Continue consuming products containing baked egg as you have tolerated this without adverse reaction. In case of an allergic reaction, take Benadryl 50 mg every 4 hours, and if life-threatening symptoms occur, inject with EpiPen 0.3 mg. Return to the clinic if you would like to have a stovetop egg food challenge.  Remember to stop antihistamines for 3 days before your food challenge appointment.  Call the clinic if you are interested in this option.  Call the clinic if this treatment plan is not working well for you  Follow up in 3 months or sooner if needed.

## 2023-04-14 ENCOUNTER — Other Ambulatory Visit: Payer: Self-pay

## 2023-04-14 DIAGNOSIS — Z1231 Encounter for screening mammogram for malignant neoplasm of breast: Secondary | ICD-10-CM

## 2023-04-16 ENCOUNTER — Encounter (INDEPENDENT_AMBULATORY_CARE_PROVIDER_SITE_OTHER): Payer: Self-pay | Admitting: Family Medicine

## 2023-04-16 ENCOUNTER — Ambulatory Visit (INDEPENDENT_AMBULATORY_CARE_PROVIDER_SITE_OTHER): Payer: No Typology Code available for payment source | Admitting: Family Medicine

## 2023-04-16 VITALS — BP 135/88 | HR 84 | Temp 98.6°F | Ht 63.0 in | Wt 135.0 lb

## 2023-04-16 DIAGNOSIS — R632 Polyphagia: Secondary | ICD-10-CM

## 2023-04-16 DIAGNOSIS — E669 Obesity, unspecified: Secondary | ICD-10-CM

## 2023-04-16 DIAGNOSIS — Z6823 Body mass index (BMI) 23.0-23.9, adult: Secondary | ICD-10-CM

## 2023-04-16 DIAGNOSIS — Z6824 Body mass index (BMI) 24.0-24.9, adult: Secondary | ICD-10-CM

## 2023-04-16 MED ORDER — SEMAGLUTIDE-WEIGHT MANAGEMENT 1 MG/0.5ML ~~LOC~~ SOAJ
1.0000 mg | SUBCUTANEOUS | 0 refills | Status: DC
Start: 2023-04-16 — End: 2023-06-04

## 2023-04-21 NOTE — Progress Notes (Unsigned)
Chief Complaint:   OBESITY Angela Mcconnell is here to discuss her progress with her obesity treatment plan along with follow-up of her obesity related diagnoses. Angela Mcconnell is on keeping a food journal and adhering to recommended goals of 1200 calories and 75+ grams of protein and states she is following her eating plan approximately 80% of the time. Angela Mcconnell states she is walking 1 mile for 30-60 minutes 7 times per week.  Today's visit was #: 24 Starting weight: 186 lbs Starting date: 06/01/2020 Today's weight: 135 lbs Today's date: 04/16/2023 Total lbs lost to date: 51 Total lbs lost since last in-office visit: 0  Interim History: Angela Mcconnell did some celebration eating over Mother's Day.  She is doing well with her exercise, but her hunger is an issue at times.  She had 1 episode of nausea when her simple carbohydrates increased.  Subjective:   1. Polyphagia Angela Mcconnell is doing well on Ozempic, but she notes polyphagia has increased in the mid morning.  She is working on portion control but her protein at breakfast is below 30 grams.  She also does cardio for 60 minutes in the morning.  Assessment/Plan:   1. Polyphagia We will refill Ozempic at 1 mg once weekly for 1 month.  Angela Mcconnell is to change her exercise to 30 minutes of cardio and 15 minutes of strengthening.  She will increase her protein at breakfast to 30 grams and we will monitor her progress.  - Semaglutide-Weight Management 1 MG/0.5ML SOAJ; Inject 1 mg into the skin once a week.  Dispense: 6 mL; Refill: 0  2. BMI 24.0-24.9, adult  3. Obesity, Beginning BMI 32.95 Angela Mcconnell is currently in the action stage of change. As such, her goal is to continue with weight loss efforts. She has agreed to practicing portion control and making smarter food choices, such as increasing vegetables and decreasing simple carbohydrates with 30 grams of protein at breakfast.   Exercise goals: Increase strengthening exercise.   Behavioral modification  strategies: increasing lean protein intake and no skipping meals.  Angela Mcconnell has agreed to follow-up with our clinic in 3 weeks. She was informed of the importance of frequent follow-up visits to maximize her success with intensive lifestyle modifications for her multiple health conditions.   Objective:   Blood pressure 135/88, pulse 84, temperature 98.6 F (37 C), height 5\' 3"  (1.6 m), weight 135 lb (61.2 kg), SpO2 100 %. Body mass index is 23.91 kg/m.  Lab Results  Component Value Date   CREATININE 0.95 01/02/2023   BUN 10 01/02/2023   NA 142 01/02/2023   K 4.0 01/02/2023   CL 103 01/02/2023   CO2 22 01/02/2023   Lab Results  Component Value Date   ALT 8 01/02/2023   AST 16 01/02/2023   ALKPHOS 101 01/02/2023   BILITOT 0.4 01/02/2023   Lab Results  Component Value Date   HGBA1C 4.9 01/02/2023   HGBA1C 5.4 06/01/2020   Lab Results  Component Value Date   INSULIN 7.2 01/02/2023   INSULIN 10.8 06/01/2020   Lab Results  Component Value Date   TSH 1.070 06/01/2020   Lab Results  Component Value Date   CHOL 205 (H) 01/02/2023   HDL 75 01/02/2023   LDLCALC 117 (H) 01/02/2023   TRIG 70 01/02/2023   CHOLHDL 3.3 02/07/2020   Lab Results  Component Value Date   VD25OH 30.0 01/02/2023   VD25OH 39.6 06/01/2020   Lab Results  Component Value Date   WBC 8.6 06/01/2020  HGB 12.8 06/01/2020   HCT 40.1 06/01/2020   MCV 94 06/01/2020   PLT 319 06/01/2020   Lab Results  Component Value Date   IRON 95 06/01/2020   TIBC 315 06/01/2020   FERRITIN 228 (H) 06/01/2020   Attestation Statements:   Reviewed by clinician on day of visit: allergies, medications, problem list, medical history, surgical history, family history, social history, and previous encounter notes.  Time spent on visit including pre-visit chart review and post-visit care and charting was 30 minutes.   I, Burt Knack, am acting as transcriptionist for Quillian Quince, MD.  I have reviewed the above  documentation for accuracy and completeness, and I agree with the above. -  Quillian Quince, MD

## 2023-05-08 ENCOUNTER — Encounter (INDEPENDENT_AMBULATORY_CARE_PROVIDER_SITE_OTHER): Payer: Self-pay | Admitting: Family Medicine

## 2023-05-08 ENCOUNTER — Ambulatory Visit (INDEPENDENT_AMBULATORY_CARE_PROVIDER_SITE_OTHER): Payer: No Typology Code available for payment source | Admitting: Family Medicine

## 2023-05-08 VITALS — BP 116/70 | HR 90 | Temp 98.5°F | Ht 63.0 in | Wt 134.0 lb

## 2023-05-08 DIAGNOSIS — D171 Benign lipomatous neoplasm of skin and subcutaneous tissue of trunk: Secondary | ICD-10-CM

## 2023-05-08 DIAGNOSIS — E669 Obesity, unspecified: Secondary | ICD-10-CM | POA: Diagnosis not present

## 2023-05-08 DIAGNOSIS — D1779 Benign lipomatous neoplasm of other sites: Secondary | ICD-10-CM

## 2023-05-08 DIAGNOSIS — Z6823 Body mass index (BMI) 23.0-23.9, adult: Secondary | ICD-10-CM | POA: Diagnosis not present

## 2023-05-08 NOTE — Progress Notes (Signed)
.[facility]  Office: [phone]  /  Fax: [phone]  WEIGHT SUMMARY AND BIOMETRICS  Anthropometric Measurements Height: 5\' 3"  (1.6 m) Weight: 134 lb (60.8 kg) BMI (Calculated): 23.74 Weight at Last Visit: 135 lb Weight Lost Since Last Visit: 1 lb Weight Gained Since Last Visit: 0   Body Composition  Body Fat %: 32.9 % Fat Mass (lbs): 44.2 lbs Muscle Mass (lbs): 85.8 lbs Total Body Water (lbs): 59.6 lbs Visceral Fat Rating : 7   Other Clinical Data Fasting: No Labs: No Today's Visit #: 25  Discussed the use of AI scribe software for clinical note transcription with the patient, who gave verbal consent to proceed.  Chief Complaint: OBESITY  Angela Mcconnell is here to discuss her progress with her obesity treatment plan. She is on the practicing portion control and making smarter food choices, such as increasing vegetables and decreasing simple carbohydrates and states she is following her eating plan approximately 95 % of the time. She states she is exercising 30-45 minutes 5 times per week.   History of Present Illness   The patient, with a history of obesity, has been actively working on weight loss and reports a decrease of one pound since the last visit. They have implemented a new dietary regimen, which includes juicing various fruits and vegetables. This change has reportedly helped curb their appetite. However, they have not been tracking the nutritional content of these juices.  The patient also reports a recent struggle with meat consumption due to personal concerns about the meat industry. They have been supplementing their protein intake with Noosa yogurt and ensuring their meals contain protein. However, they have not been tracking their protein intake and are unsure if they are meeting their daily protein needs.  In addition to dietary changes, the patient has been maintaining an active lifestyle with regular use of a Peloton bike, treadmill, and gardening. Despite these  efforts, the patient reports a feeling of stagnation in their weight loss progress.  The patient is currently on Wegovy, a medication for weight management, at a medium dose of 1mg . They report that the medication does not curb their desire to eat but seems to assist in their weight loss efforts.  In addition to their weight management concerns, the patient reports a recent diagnosis of a lipoma located on their back, causing significant pain radiating down to their fingers. They have been prescribed gabapentin to manage the pain. The lipoma is scheduled to be addressed upon their return from an upcoming trip to [place].          PHYSICAL EXAM:  Blood pressure 116/70, pulse 90, temperature 98.5 F (36.9 C), height 5\' 3"  (1.6 m), weight 134 lb (60.8 kg), SpO2 99 %. Body mass index is 23.74 kg/m.  DIAGNOSTIC DATA REVIEWED:  BMET    Component Value Date/Time   NA 142 01/02/2023 0905   K 4.0 01/02/2023 0905   CL 103 01/02/2023 0905   CO2 22 01/02/2023 0905   GLUCOSE 79 01/02/2023 0905   GLUCOSE 137 (H) 10/16/2018 0828   BUN 10 01/02/2023 0905   CREATININE 0.95 01/02/2023 0905   CALCIUM 10.0 01/02/2023 0905   GFRNONAA 76 06/01/2020 1434   GFRAA 88 06/01/2020 1434   Lab Results  Component Value Date   HGBA1C 4.9 01/02/2023   HGBA1C 5.4 06/01/2020   Lab Results  Component Value Date   INSULIN 7.2 01/02/2023   INSULIN 10.8 06/01/2020   Lab Results  Component Value Date   TSH 1.070 06/01/2020  CBC    Component Value Date/Time   WBC 8.6 06/01/2020 1434   WBC 16.0 (H) 10/18/2018 0421   RBC 4.28 06/01/2020 1434   RBC 3.38 (L) 10/18/2018 0421   HGB 12.8 06/01/2020 1434   HCT 40.1 06/01/2020 1434   PLT 319 06/01/2020 1434   MCV 94 06/01/2020 1434   MCH 29.9 06/01/2020 1434   MCH 30.2 10/18/2018 0421   MCHC 31.9 06/01/2020 1434   MCHC 33.1 10/18/2018 0421   RDW 12.8 06/01/2020 1434   Iron Studies    Component Value Date/Time   IRON 95 06/01/2020 1434   TIBC 315  06/01/2020 1434   FERRITIN 228 (H) 06/01/2020 1434   IRONPCTSAT 30 06/01/2020 1434   Lipid Panel     Component Value Date/Time   CHOL 205 (H) 01/02/2023 0905   TRIG 70 01/02/2023 0905   HDL 75 01/02/2023 0905   CHOLHDL 3.3 02/07/2020 1003   CHOLHDL 3.2 10/23/2009 0730   VLDL 9 10/23/2009 0730   LDLCALC 117 (H) 01/02/2023 0905   Hepatic Function Panel     Component Value Date/Time   PROT 7.8 01/02/2023 0905   ALBUMIN 5.2 (H) 01/02/2023 0905   AST 16 01/02/2023 0905   ALT 8 01/02/2023 0905   ALKPHOS 101 01/02/2023 0905   BILITOT 0.4 01/02/2023 0905      Component Value Date/Time   TSH 1.070 06/01/2020 1434   Nutritional Lab Results  Component Value Date   VD25OH 30.0 01/02/2023   VD25OH 39.6 06/01/2020     Assessment and Plan    Weight Management: Continued weight loss with BMI now in the 23 range. Patient has incorporated juicing into her diet and reports improved satiety. Protein intake may be suboptimal, potentially impacting metabolism and weight loss. -Encourage patient to aim for a minimum of 75 grams of protein daily, considering protein supplements if necessary. -Continue Wegovy 1mg  weekly. -Next appointment in six weeks.  Lipoma: Newly identified lipoma causing pain radiating down to the fingers, likely due to nerve impingement. Gabapentin started for pain management. -Plan for removal upon patient's return from travel. -Continue Gabapentin as prescribed.         I have personally spent 30 minutes total time today in preparation, patient care, and documentation for this visit, including the following: review of clinical lab tests; review of medical tests/procedures/services.    She was informed of the importance of frequent follow up visits to maximize her success with intensive lifestyle modifications for her multiple health conditions. Return in about 6 weeks (around 06/19/2023).   Quillian Quince, MD

## 2023-05-19 ENCOUNTER — Other Ambulatory Visit: Payer: Self-pay | Admitting: Physician Assistant

## 2023-05-19 ENCOUNTER — Ambulatory Visit
Admission: RE | Admit: 2023-05-19 | Discharge: 2023-05-19 | Disposition: A | Discharge status: Home / Self Care | Payer: No Typology Code available for payment source | Source: Ambulatory Visit

## 2023-05-19 DIAGNOSIS — Z1231 Encounter for screening mammogram for malignant neoplasm of breast: Secondary | ICD-10-CM

## 2023-06-04 ENCOUNTER — Ambulatory Visit (INDEPENDENT_AMBULATORY_CARE_PROVIDER_SITE_OTHER): Payer: No Typology Code available for payment source | Admitting: Family Medicine

## 2023-06-04 ENCOUNTER — Encounter (INDEPENDENT_AMBULATORY_CARE_PROVIDER_SITE_OTHER): Payer: Self-pay | Admitting: Family Medicine

## 2023-06-04 VITALS — BP 117/80 | HR 85 | Temp 98.2°F | Ht 63.0 in | Wt 137.0 lb

## 2023-06-04 DIAGNOSIS — E669 Obesity, unspecified: Secondary | ICD-10-CM

## 2023-06-04 DIAGNOSIS — Z6824 Body mass index (BMI) 24.0-24.9, adult: Secondary | ICD-10-CM | POA: Diagnosis not present

## 2023-06-04 DIAGNOSIS — R632 Polyphagia: Secondary | ICD-10-CM | POA: Diagnosis not present

## 2023-06-04 MED ORDER — SEMAGLUTIDE-WEIGHT MANAGEMENT 1 MG/0.5ML ~~LOC~~ SOAJ
1.0000 mg | SUBCUTANEOUS | 0 refills | Status: DC
Start: 2023-06-04 — End: 2023-07-16

## 2023-06-04 NOTE — Progress Notes (Signed)
Chief Complaint:   OBESITY Angela Mcconnell is here to discuss her progress with her obesity treatment plan along with follow-up of her obesity related diagnoses. Angela Mcconnell is on keeping a food journal and adhering to recommended goals of 1200 calories and 75 grams of protein and states she is following her eating plan approximately 85% of the time. Angela Mcconnell states she is on the treadmill, biking, and walking for 60 minutes 5 times per week.  Today's visit was #: 26 Starting weight: 186 lbs Starting date: 06/01/2020 Today's weight: 137 lbs Today's date: 06/04/2023 Total lbs lost to date: 49 Total lbs lost since last in-office visit: 0  Interim History: Patient has been on vacation and did well with meeting her protein goal, but she struggled with vegetables due to different selections than she was used to.  She would like to go back to journaling.  Subjective:   1. Polyphagia Patient is on Wegovy and she is doing well overall.  Polyphagia has decreased with no side effects noted.  Assessment/Plan:   1. Polyphagia Patient will continue Wegovy and continue with her diet, and we will refill Wegovy 1 mg once weekly for 1 month.  - Semaglutide-Weight Management 1 MG/0.5ML SOAJ; Inject 1 mg into the skin once a week.  Dispense: 6 mL; Refill: 0  2. BMI 24.0-24.9, adult  3. Obesity, Beginning BMI 32.95 Latiffany is currently in the action stage of change. As such, her goal is to continue with weight loss efforts. She has agreed to keeping a food journal and adhering to recommended goals of 1200 calories and 75 grams of protein daily.   Exercise goals: As is.   Behavioral modification strategies: no skipping meals and travel eating strategies.  Keiyana has agreed to follow-up with our clinic in 6 weeks. She was informed of the importance of frequent follow-up visits to maximize her success with intensive lifestyle modifications for her multiple health conditions.   Objective:   Blood pressure  117/80, pulse 85, temperature 98.2 F (36.8 C), height 5\' 3"  (1.6 m), weight 137 lb (62.1 kg), SpO2 100 %. Body mass index is 24.27 kg/m.  Lab Results  Component Value Date   CREATININE 0.95 01/02/2023   BUN 10 01/02/2023   NA 142 01/02/2023   K 4.0 01/02/2023   CL 103 01/02/2023   CO2 22 01/02/2023   Lab Results  Component Value Date   ALT 8 01/02/2023   AST 16 01/02/2023   ALKPHOS 101 01/02/2023   BILITOT 0.4 01/02/2023   Lab Results  Component Value Date   HGBA1C 4.9 01/02/2023   HGBA1C 5.4 06/01/2020   Lab Results  Component Value Date   INSULIN 7.2 01/02/2023   INSULIN 10.8 06/01/2020   Lab Results  Component Value Date   TSH 1.070 06/01/2020   Lab Results  Component Value Date   CHOL 205 (H) 01/02/2023   HDL 75 01/02/2023   LDLCALC 117 (H) 01/02/2023   TRIG 70 01/02/2023   CHOLHDL 3.3 02/07/2020   Lab Results  Component Value Date   VD25OH 30.0 01/02/2023   VD25OH 39.6 06/01/2020   Lab Results  Component Value Date   WBC 8.6 06/01/2020   HGB 12.8 06/01/2020   HCT 40.1 06/01/2020   MCV 94 06/01/2020   PLT 319 06/01/2020   Lab Results  Component Value Date   IRON 95 06/01/2020   TIBC 315 06/01/2020   FERRITIN 228 (H) 06/01/2020   Attestation Statements:   Reviewed by clinician on  day of visit: allergies, medications, problem list, medical history, surgical history, family history, social history, and previous encounter notes.   I, Burt Knack, am acting as transcriptionist for Quillian Quince, MD.  I have reviewed the above documentation for accuracy and completeness, and I agree with the above. -  Quillian Quince, MD

## 2023-07-16 ENCOUNTER — Ambulatory Visit (INDEPENDENT_AMBULATORY_CARE_PROVIDER_SITE_OTHER): Payer: No Typology Code available for payment source | Admitting: Family Medicine

## 2023-07-16 ENCOUNTER — Encounter (INDEPENDENT_AMBULATORY_CARE_PROVIDER_SITE_OTHER): Payer: Self-pay | Admitting: Family Medicine

## 2023-07-16 ENCOUNTER — Telehealth (INDEPENDENT_AMBULATORY_CARE_PROVIDER_SITE_OTHER): Payer: Self-pay | Admitting: Family Medicine

## 2023-07-16 ENCOUNTER — Other Ambulatory Visit (INDEPENDENT_AMBULATORY_CARE_PROVIDER_SITE_OTHER): Payer: Self-pay | Admitting: Family Medicine

## 2023-07-16 VITALS — BP 107/72 | HR 73 | Temp 97.6°F | Ht 63.0 in | Wt 137.0 lb

## 2023-07-16 DIAGNOSIS — Z6824 Body mass index (BMI) 24.0-24.9, adult: Secondary | ICD-10-CM | POA: Diagnosis not present

## 2023-07-16 DIAGNOSIS — E669 Obesity, unspecified: Secondary | ICD-10-CM

## 2023-07-16 DIAGNOSIS — E559 Vitamin D deficiency, unspecified: Secondary | ICD-10-CM

## 2023-07-16 DIAGNOSIS — F3289 Other specified depressive episodes: Secondary | ICD-10-CM

## 2023-07-16 DIAGNOSIS — F32A Depression, unspecified: Secondary | ICD-10-CM | POA: Insufficient documentation

## 2023-07-16 MED ORDER — ZEPBOUND 10 MG/0.5ML ~~LOC~~ SOAJ
10.0000 mg | SUBCUTANEOUS | 0 refills | Status: DC
Start: 2023-07-16 — End: 2023-08-14

## 2023-07-16 NOTE — Telephone Encounter (Signed)
The patient has requested a PA for Zepbound.

## 2023-07-17 ENCOUNTER — Other Ambulatory Visit (INDEPENDENT_AMBULATORY_CARE_PROVIDER_SITE_OTHER): Payer: Self-pay | Admitting: Family Medicine

## 2023-07-17 DIAGNOSIS — E669 Obesity, unspecified: Secondary | ICD-10-CM

## 2023-07-17 DIAGNOSIS — Z6824 Body mass index (BMI) 24.0-24.9, adult: Secondary | ICD-10-CM

## 2023-07-17 NOTE — Telephone Encounter (Signed)
Angela Mcconnell (Key: [id number])  Your information has been submitted to [employer]. To check for an updated outcome later, reopen this PA request from your dashboard.  If [employer] has not responded to your request within 24 hours, contact [employer] at [phone]. If you think there may be a problem with your PA request, use our live chat feature at the bottom right

## 2023-07-21 NOTE — Progress Notes (Unsigned)
Chief Complaint:   OBESITY Angela Mcconnell is here to discuss her progress with her obesity treatment plan along with follow-up of her obesity related diagnoses. Angela Mcconnell is on keeping a food journal and adhering to recommended goals of 1200 calories and 75 grams of protein and states she is following her eating plan approximately 100% of the time. Angela Mcconnell states she is on the treadmill, peloton, and walking for 30-60 minutes 5 times per week.  Today's visit was #: 27 Starting weight: 186 lbs Starting date: 06/01/2020 Today's weight: 137 lbs Today's date: 07/16/2023 Total lbs lost to date: 49 Total lbs lost since last in-office visit: 0  Interim History: Patient has done well with maintaining her weight loss.  She notes increased cravings recently and she is worried about weight regain.  Subjective:   1. Vitamin D deficiency Patient is stable on OTC vitamin D, and she denies nausea, vomiting, or muscle weakness.  2. Emotional Eating Behavior Patient notes increased cravings when she is on gabapentin.  She was changed to nortriptyline but her cravings are still high.  Assessment/Plan:   1. Vitamin D deficiency Patient will continue OTC vitamin D 4000 IU daily, and we will recheck labs in 1 to 2 months.  2. Emotional Eating Behavior Patient will change to Zepbound which tends to help emotional eating behavior as well as polyphagia.  Patient will continue with her diet and we will follow-up at her next visit in 1 month.  3. BMI 24.0-24.9, adult  - tirzepatide (ZEPBOUND) 10 MG/0.5ML Pen; Inject 10 mg into the skin once a week.  Dispense: 2 mL; Refill: 0  4. Obesity, Beginning BMI 32.95 Patient agreed to discontinue Wegovy, and start Zepbound 10 mg once weekly with no refills.  - tirzepatide (ZEPBOUND) 10 MG/0.5ML Pen; Inject 10 mg into the skin once a week.  Dispense: 2 mL; Refill: 0  Angela Mcconnell is currently in the action stage of change. As such, her goal is to continue with weight loss  efforts. She has agreed to keeping a food journal and adhering to recommended goals of 1200 calories and 75+ grams of protein daily.   Exercise goals: As is.   Behavioral modification strategies: increasing lean protein intake and emotional eating strategies.  Angela Mcconnell has agreed to follow-up with our clinic in 4 weeks. She was informed of the importance of frequent follow-up visits to maximize her success with intensive lifestyle modifications for her multiple health conditions.   Objective:   Blood pressure 107/72, pulse 73, temperature 97.6 F (36.4 C), height 5\' 3"  (1.6 m), weight 137 lb (62.1 kg), SpO2 98%. Body mass index is 24.27 kg/m.  Lab Results  Component Value Date   CREATININE 0.95 01/02/2023   BUN 10 01/02/2023   NA 142 01/02/2023   K 4.0 01/02/2023   CL 103 01/02/2023   CO2 22 01/02/2023   Lab Results  Component Value Date   ALT 8 01/02/2023   AST 16 01/02/2023   ALKPHOS 101 01/02/2023   BILITOT 0.4 01/02/2023   Lab Results  Component Value Date   HGBA1C 4.9 01/02/2023   HGBA1C 5.4 06/01/2020   Lab Results  Component Value Date   INSULIN 7.2 01/02/2023   INSULIN 10.8 06/01/2020   Lab Results  Component Value Date   TSH 1.070 06/01/2020   Lab Results  Component Value Date   CHOL 205 (H) 01/02/2023   HDL 75 01/02/2023   LDLCALC 117 (H) 01/02/2023   TRIG 70 01/02/2023   CHOLHDL  3.3 02/07/2020   Lab Results  Component Value Date   VD25OH 30.0 01/02/2023   VD25OH 39.6 06/01/2020   Lab Results  Component Value Date   WBC 8.6 06/01/2020   HGB 12.8 06/01/2020   HCT 40.1 06/01/2020   MCV 94 06/01/2020   PLT 319 06/01/2020   Lab Results  Component Value Date   IRON 95 06/01/2020   TIBC 315 06/01/2020   FERRITIN 228 (H) 06/01/2020   Attestation Statements:   Reviewed by clinician on day of visit: allergies, medications, problem list, medical history, surgical history, family history, social history, and previous encounter notes.   I,  Burt Knack, am acting as transcriptionist for Quillian Quince, MD.  I have reviewed the above documentation for accuracy and completeness, and I agree with the above. -  Quillian Quince, MD

## 2023-07-24 NOTE — Telephone Encounter (Signed)
PA for Zepbound has been approved.  Outcome Approved on August 15 by [employer] NCPDP 2017 Your PA request has been approved. Additional information will be provided in the approval communication. (Message 1145) Authorization Expiration Date: 03/15/2024

## 2023-08-14 ENCOUNTER — Telehealth (INDEPENDENT_AMBULATORY_CARE_PROVIDER_SITE_OTHER): Payer: No Typology Code available for payment source | Admitting: Family Medicine

## 2023-08-14 ENCOUNTER — Encounter (INDEPENDENT_AMBULATORY_CARE_PROVIDER_SITE_OTHER): Payer: Self-pay | Admitting: Family Medicine

## 2023-08-14 DIAGNOSIS — E669 Obesity, unspecified: Secondary | ICD-10-CM

## 2023-08-14 DIAGNOSIS — Z6824 Body mass index (BMI) 24.0-24.9, adult: Secondary | ICD-10-CM

## 2023-08-14 DIAGNOSIS — R632 Polyphagia: Secondary | ICD-10-CM

## 2023-08-14 DIAGNOSIS — M542 Cervicalgia: Secondary | ICD-10-CM | POA: Diagnosis not present

## 2023-08-14 MED ORDER — ZEPBOUND 10 MG/0.5ML ~~LOC~~ SOAJ
10.0000 mg | SUBCUTANEOUS | 0 refills | Status: DC
Start: 2023-08-14 — End: 2023-09-11

## 2023-08-20 NOTE — Progress Notes (Unsigned)
TeleHealth Visit:  Due to the COVID-19 pandemic, this visit was completed with telemedicine (audio/video) technology to reduce patient and provider exposure as well as to preserve personal protective equipment.   Angela Mcconnell has verbally consented to this TeleHealth visit. The patient is located at home, the provider is located at the [facility] office. The participants in this visit include the listed provider and patient. The visit was conducted today via MyChart video.  Chief Complaint: Angela Mcconnell is here to discuss her progress with her Angela treatment plan along with follow-up of her Angela related diagnoses. Angela Mcconnell is on keeping a food journal and adhering to recommended goals of 1200 calories and 75+ grams of protein and states she is following her eating plan approximately 100% of the time. Angela Mcconnell states she is walking and on the peloton 10 for 30 minutes 5 times per week.  Today's visit was #: 28 Starting weight: 186 lbs Starting date: 06/01/2020  Interim History: Patient is working on meal planning and prepping more.  She notes her hunger is mostly controlled and her portion size has decreased.  Subjective:   1. Polyphagia Patient finished her first 4 weeks of Zepbound.  She has no GI issues, and her hunger is appropriate and portions are decreased.  2. Neck pain Patient was recently diagnosed with pinched nerve in her neck.  She is going for a steroid injection tomorrow and she is concerned about pain with the injection.  She is planning on starting pickleball in addition to her other exercise.  Assessment/Plan:   1. Polyphagia Patient will continue Zepbound 10 mg once weekly, and we will refill for 1 month.  - tirzepatide (ZEPBOUND) 10 MG/0.5ML Pen; Inject 10 mg into the skin once a week.  Dispense: 2 mL; Refill: 0  2. Neck pain Patient was encouraged to have her injection and slowly ease into pickleball to avoid injury.  We will follow  closely.  3. BMI 24.0-24.9, adult  4. Angela, Beginning BMI 32.95 We will refill Zepbound for 1 month.   - tirzepatide (ZEPBOUND) 10 MG/0.5ML Pen; Inject 10 mg into the skin once a week.  Dispense: 2 mL; Refill: 0  Angela Mcconnell is currently in the action stage of change. As such, her goal is to continue with weight loss efforts. She has agreed to keeping a food journal and adhering to recommended goals of 1200 calories and 75+ grams of protein daily.   Exercise goals: As is.   Behavioral modification strategies: no skipping meals and meal planning and cooking strategies.  Angela Mcconnell has agreed to follow-up with our clinic in 4 weeks. She was informed of the importance of frequent follow-up visits to maximize her success with intensive lifestyle modifications for her multiple health conditions.  Objective:   VITALS: Per patient if applicable, see vitals. GENERAL: Alert and in no acute distress. CARDIOPULMONARY: No increased WOB. Speaking in clear sentences.  PSYCH: Pleasant and cooperative. Speech normal rate and rhythm. Affect is appropriate. Insight and judgement are appropriate. Attention is focused, linear, and appropriate.  NEURO: Oriented as arrived to appointment on time with no prompting.   Lab Results  Component Value Date   CREATININE 0.95 01/02/2023   BUN 10 01/02/2023   NA 142 01/02/2023   K 4.0 01/02/2023   CL 103 01/02/2023   CO2 22 01/02/2023   Lab Results  Component Value Date   ALT 8 01/02/2023   AST 16 01/02/2023   ALKPHOS 101 01/02/2023   BILITOT 0.4 01/02/2023  Lab Results  Component Value Date   HGBA1C 4.9 01/02/2023   HGBA1C 5.4 06/01/2020   Lab Results  Component Value Date   INSULIN 7.2 01/02/2023   INSULIN 10.8 06/01/2020   Lab Results  Component Value Date   TSH 1.070 06/01/2020   Lab Results  Component Value Date   CHOL 205 (H) 01/02/2023   HDL 75 01/02/2023   LDLCALC 117 (H) 01/02/2023   TRIG 70 01/02/2023   CHOLHDL 3.3 02/07/2020    Lab Results  Component Value Date   VD25OH 30.0 01/02/2023   VD25OH 39.6 06/01/2020   Lab Results  Component Value Date   WBC 8.6 06/01/2020   HGB 12.8 06/01/2020   HCT 40.1 06/01/2020   MCV 94 06/01/2020   PLT 319 06/01/2020   Lab Results  Component Value Date   IRON 95 06/01/2020   TIBC 315 06/01/2020   FERRITIN 228 (H) 06/01/2020    Attestation Statements:   Reviewed by clinician on day of visit: allergies, medications, problem list, medical history, surgical history, family history, social history, and previous encounter notes.   I, Burt Knack, am acting as transcriptionist for Quillian Quince, MD.  I have reviewed the above documentation for accuracy and completeness, and I agree with the above. - Quillian Quince, MD

## 2023-09-11 ENCOUNTER — Encounter (INDEPENDENT_AMBULATORY_CARE_PROVIDER_SITE_OTHER): Payer: Self-pay | Admitting: Family Medicine

## 2023-09-11 ENCOUNTER — Ambulatory Visit (INDEPENDENT_AMBULATORY_CARE_PROVIDER_SITE_OTHER): Payer: No Typology Code available for payment source | Admitting: Family Medicine

## 2023-09-11 VITALS — BP 125/78 | HR 82 | Temp 97.7°F | Ht 63.0 in | Wt 134.0 lb

## 2023-09-11 DIAGNOSIS — E669 Obesity, unspecified: Secondary | ICD-10-CM | POA: Diagnosis not present

## 2023-09-11 DIAGNOSIS — Z6823 Body mass index (BMI) 23.0-23.9, adult: Secondary | ICD-10-CM | POA: Diagnosis not present

## 2023-09-11 DIAGNOSIS — R632 Polyphagia: Secondary | ICD-10-CM

## 2023-09-11 MED ORDER — ZEPBOUND 10 MG/0.5ML ~~LOC~~ SOAJ
10.0000 mg | SUBCUTANEOUS | 0 refills | Status: DC
Start: 2023-09-11 — End: 2023-10-09

## 2023-09-11 NOTE — Progress Notes (Signed)
Chief Complaint:   OBESITY Angela Mcconnell is here to discuss her progress with her obesity treatment plan along with follow-up of her obesity related diagnoses. Angela Mcconnell is on keeping a food journal and adhering to recommended goals of 1200 calories and 75+ grams of protein and states she is following her eating plan approximately 100% of the time. Angela Mcconnell states she is on the treadmill, exercise class, and using bands for 30-60 minutes 7 times per week.  Today's visit was #: 29 Starting weight: 186 lbs Starting date: 06/01/2020 Today's weight: 134 lbs Today's date: 09/11/2023 Total lbs lost to date: 52 Total lbs lost since last in-office visit: 3  Interim History: Patient continues to do well with her weight loss.  She is working on meeting her protein goals.  She is enjoying her exercise and her hunger is controlled.  She is close to her goal weight of 130 pounds.  Subjective:   1. Polyphagia Patient is working on her diet and Zepbound.  No GI upset or constipation was mentioned.  Assessment/Plan:   1. Polyphagia Patient will continue with her diet and Zepbound, and we will refill Zepbound 10 mg once weekly for 1 month.  - tirzepatide (ZEPBOUND) 10 MG/0.5ML Pen; Inject 10 mg into the skin once a week.  Dispense: 2 mL; Refill: 0  2. BMI 23.0-23.9, adult  3. Obesity, Beginning BMI 32.95 - tirzepatide (ZEPBOUND) 10 MG/0.5ML Pen; Inject 10 mg into the skin once a week.  Dispense: 2 mL; Refill: 0  Angela Mcconnell is currently in the action stage of change. As such, her goal is to continue with weight loss efforts. She has agreed to keeping a food journal and adhering to recommended goals of 1200 calories and 75+ grams of protein daily.   Exercise goals: As is.   Behavioral modification strategies: increasing lean protein intake.  Angela Mcconnell has agreed to follow-up with our clinic in 4 weeks. She was informed of the importance of frequent follow-up visits to maximize her success with intensive  lifestyle modifications for her multiple health conditions.   Objective:   Blood pressure 125/78, pulse 82, temperature 97.7 F (36.5 C), height 5\' 3"  (1.6 m), weight 134 lb (60.8 kg), SpO2 99%. Body mass index is 23.74 kg/m.  Lab Results  Component Value Date   CREATININE 0.95 01/02/2023   BUN 10 01/02/2023   NA 142 01/02/2023   K 4.0 01/02/2023   CL 103 01/02/2023   CO2 22 01/02/2023   Lab Results  Component Value Date   ALT 8 01/02/2023   AST 16 01/02/2023   ALKPHOS 101 01/02/2023   BILITOT 0.4 01/02/2023   Lab Results  Component Value Date   HGBA1C 4.9 01/02/2023   HGBA1C 5.4 06/01/2020   Lab Results  Component Value Date   INSULIN 7.2 01/02/2023   INSULIN 10.8 06/01/2020   Lab Results  Component Value Date   TSH 1.070 06/01/2020   Lab Results  Component Value Date   CHOL 205 (H) 01/02/2023   HDL 75 01/02/2023   LDLCALC 117 (H) 01/02/2023   TRIG 70 01/02/2023   CHOLHDL 3.3 02/07/2020   Lab Results  Component Value Date   VD25OH 30.0 01/02/2023   VD25OH 39.6 06/01/2020   Lab Results  Component Value Date   WBC 8.6 06/01/2020   HGB 12.8 06/01/2020   HCT 40.1 06/01/2020   MCV 94 06/01/2020   PLT 319 06/01/2020   Lab Results  Component Value Date   IRON 95 06/01/2020  TIBC 315 06/01/2020   FERRITIN 228 (H) 06/01/2020   Attestation Statements:   Reviewed by clinician on day of visit: allergies, medications, problem list, medical history, surgical history, family history, social history, and previous encounter notes.  Time spent on visit including pre-visit chart review and post-visit care and charting was 30 minutes.   I, Burt Knack, am acting as transcriptionist for Quillian Quince, MD.  I have reviewed the above documentation for accuracy and completeness, and I agree with the above. -  Quillian Quince, MD

## 2023-09-11 NOTE — Progress Notes (Deleted)
62 y.o. G2P0000 Significant Other African American female here for annual exam.    PCP: Barnie Mort, PA-C   No LMP recorded. Patient has had a hysterectomy.           Sexually active: {yes no:314532}  The current method of family planning is status post hysterectomy.    Exercising: {yes no:314532}  {types:19826} Smoker:  no  OB History  Gravida Para Term Preterm AB Living  2 2 0 0 0    SAB IAB Ectopic Multiple Live Births  0 0 0        # Outcome Date GA Lbr Len/2nd Weight Sex Type Anes PTL Lv  2 Para           1 Para              Health Maintenance: Pap:  2010 WNL History of abnormal Pap:  yes MMG: 05/19/23 Breast Density Cat C, BI-RADS CAT 1 neg Colonoscopy:   [facility] Colonoscopy          Colonoscopy (Every 10 Years) Next due on 11/18/2032    11/18/2022  Done - [facility]   Only the first 1 history entries have been loaded, but more history exists.           BMD:  n/a  Result  n/a  HIV: 02/07/20 neg Hep C: 02/07/20 neg  Immunization History  Administered Date(s) Administered   PFIZER(Purple Top)SARS-COV-2 Vaccination 09/19/2020   Pfizer(Comirnaty)Fall Seasonal Vaccine 12 years and older 10/31/2022     {Labs (Optional):23779}   reports that she has never smoked. She has never used smokeless tobacco. She reports that she does not drink alcohol and does not use drugs.  Past Medical History:  Diagnosis Date   Anemia    Arthritis    Asthma    Blood transfusion without reported diagnosis 11/2018   Chest pain    Depression    Fibroid    GERD (gastroesophageal reflux disease)    Insomnia    Joint pain    Multiple food allergies    Osteoarthritis    Palpitations    Shortness of breath    Vitamin D deficiency     Past Surgical History:  Procedure Laterality Date   ABDOMINAL HYSTERECTOMY     [facility]   CORONARY ANGIOGRAM  Nov 2010   Normal coronary arteries   ESOPHAGEAL MANOMETRY N/A 08/12/2018   Procedure: ESOPHAGEAL MANOMETRY (EM);   Surgeon: Vida Rigger, MD;  Location: [facility];  Service: Endoscopy;  Laterality: N/A;   HERNIA REPAIR     INSERTION OF MESH N/A 10/14/2018   Procedure: INSERTION OF MESH;  Surgeon: Karie Soda, MD;  Location: [facility];  Service: General;  Laterality: N/A;   KNEE SURGERY     OOPHORECTOMY      Current Outpatient Medications  Medication Sig Dispense Refill   Azelastine-Fluticasone 137-50 MCG/ACT SUSP Place 1 spray into the nose 2 (two) times daily. 23 g 5   Bromfenac Sodium 0.07 % SOLN INSTILL 1 DROP IN THE RIGHT EYE THREE TIMES A DAY     Cholecalciferol (VITAMIN D) 50 MCG (2000 UT) CAPS Take 2 capsules (4,000 Units total) by mouth daily. 30 capsule 0   Difluprednate 0.05 % EMUL INSTILL 1 DROP IN RIGHT EYE FOUR TIMES DAILY FOR 7 DAYS     EPINEPHrine 0.3 mg/0.3 mL IJ SOAJ injection Inject 0.3 mg into the muscle as needed for anaphylaxis. 2 each 2   hydrOXYzine (ATARAX/VISTARIL) 25 MG tablet  TAKE 1 TABLET BY MOUTH AT BEDTIME AS NEEDED. 30 tablet 0   levocetirizine (XYZAL) 5 MG tablet Take 1 tablet (5 mg total) by mouth every evening. 90 tablet 1   Multiple Vitamin (MULTIVITAMIN) tablet Take 2 tablets by mouth daily.     Multiple Vitamins-Minerals (AIRBORNE PO) Take by mouth.     nortriptyline (PAMELOR) 10 MG capsule Take 10 mg by mouth. 3 tablets at bedtime     Olopatadine-Mometasone (RYALTRIS) 665-25 MCG/ACT SUSP two sprays per nostril 1-2 times daily as needed for runny or stuffy nose. 29 g 5   OVER THE COUNTER MEDICATION [place] Vitamin     tirzepatide (ZEPBOUND) 10 MG/0.5ML Pen Inject 10 mg into the skin once a week. 2 mL 0   traZODone (DESYREL) 100 MG tablet Take 100 mg by mouth at bedtime.     No current facility-administered medications for this visit.    Family History  Problem Relation Age of Onset   Hypertension Mother    Heart disease Mother    Stroke Mother    Obesity Mother    Hypertension Father    Lung cancer Father 69       smoked   Breast cancer Father 57 - 42        recurred in 79s   Thyroid cancer Sister 60 - 54   Ovarian cancer Sister 46   Heart attack Brother 76   Cancer Maternal Aunt        unknown type   Cancer Maternal Aunt        unknown type   Cancer Maternal Uncle        unknown type   Cancer Paternal Aunt        unknown type   Cancer Paternal Aunt        unknown type   Cancer Paternal Grandfather        unknown type   Cancer Half-Sister        paternal half-sister, unknown type of cancer   Cancer Half-Brother        paternal half-brother, blood cancer    Review of Systems  Exam:   There were no vitals taken for this visit.    General appearance: alert, cooperative and appears stated age Head: normocephalic, without obvious abnormality, atraumatic Neck: no adenopathy, supple, symmetrical, trachea midline and thyroid normal to inspection and palpation Lungs: clear to auscultation bilaterally Breasts: normal appearance, no masses or tenderness, No nipple retraction or dimpling, No nipple discharge or bleeding, No axillary adenopathy Heart: regular rate and rhythm Abdomen: soft, non-tender; no masses, no organomegaly Extremities: extremities normal, atraumatic, no cyanosis or edema Skin: skin color, texture, turgor normal. No rashes or lesions Lymph nodes: cervical, supraclavicular, and axillary nodes normal. Neurologic: grossly normal  Pelvic: External genitalia:  no lesions              No abnormal inguinal nodes palpated.              Urethra:  normal appearing urethra with no masses, tenderness or lesions              Bartholins and Skenes: normal                 Vagina: normal appearing vagina with normal color and discharge, no lesions              Cervix: no lesions              Pap taken: {yes no:314532} Bimanual Exam:  Uterus:  normal size, contour, position, consistency, mobility, non-tender              Adnexa: no mass, fullness, tenderness              Rectal exam: {yes no:314532}.  Confirms.              Anus:   normal sphincter tone, no lesions  Chaperone was present for exam:  {BSCHAPERONE:31226::"Zanyiah Posten F, CMA"}   Assessment and Plan:   ***  Mammogram screening discussed. Self breast awareness reviewed. Guidelines for Calcium, Vitamin D, regular exercise program including cardiovascular and weight bearing exercise.   No follow-ups on file.   Additional counseling given.  {yes [id number]. _______ minutes face to face time of which over 50% was spent in counseling.    After visit summary provided.

## 2023-09-25 ENCOUNTER — Ambulatory Visit: Payer: No Typology Code available for payment source | Admitting: Obstetrics and Gynecology

## 2023-10-09 ENCOUNTER — Ambulatory Visit (INDEPENDENT_AMBULATORY_CARE_PROVIDER_SITE_OTHER): Payer: No Typology Code available for payment source | Admitting: Family Medicine

## 2023-10-09 ENCOUNTER — Encounter (INDEPENDENT_AMBULATORY_CARE_PROVIDER_SITE_OTHER): Payer: Self-pay | Admitting: Family Medicine

## 2023-10-09 VITALS — BP 119/81 | HR 98 | Temp 98.0°F | Ht 63.0 in | Wt 136.0 lb

## 2023-10-09 DIAGNOSIS — E669 Obesity, unspecified: Secondary | ICD-10-CM | POA: Diagnosis not present

## 2023-10-09 DIAGNOSIS — M542 Cervicalgia: Secondary | ICD-10-CM | POA: Diagnosis not present

## 2023-10-09 DIAGNOSIS — Z6824 Body mass index (BMI) 24.0-24.9, adult: Secondary | ICD-10-CM

## 2023-10-09 DIAGNOSIS — R632 Polyphagia: Secondary | ICD-10-CM | POA: Diagnosis not present

## 2023-10-09 MED ORDER — ZEPBOUND 10 MG/0.5ML ~~LOC~~ SOAJ
10.0000 mg | SUBCUTANEOUS | 0 refills | Status: DC
Start: 2023-10-09 — End: 2023-11-24

## 2023-10-09 NOTE — Progress Notes (Signed)
.smr  Office: [phone]  /  Fax: [phone]  WEIGHT SUMMARY AND BIOMETRICS  Anthropometric Measurements Height: 5\' 3"  (1.6 m) Weight: 136 lb (61.7 kg) BMI (Calculated): 24.1 Weight at Last Visit: 134 lb Weight Lost Since Last Visit: 0 Weight Gained Since Last Visit: 2 lb Starting Weight: 186 lb Total Weight Loss (lbs): 50 lb (22.7 kg)   Body Composition  Body Fat %: 35.1 % Fat Mass (lbs): 47.8 lbs Muscle Mass (lbs): 83.8 lbs Total Body Water (lbs): 62.8 lbs Visceral Fat Rating : 8   Other Clinical Data Fasting: No Labs: No Today's Visit #: 30 Starting Date: 06/01/20    Chief Complaint: OBESITY   History of Present Illness   The patient, diagnosed with polyphasia and obesity, reports a recent weight gain of two pounds over the past month. She attributes this to a series of stressful events, including a death in the family, a spider bite, and the delay of a neck injection due to the spider bite. The patient also mentions increased frequency of eating out due to her son's varsity basketball schedule.  The patient is currently on Zepbound 10mg  weekly for polyphasia and reports no gastrointestinal side effects. She has been adhering to a calorie goal of 1200 per day and a protein goal of 75 grams or more. She also engages in 30-60 minutes of exercise five times a week, including stretching, walking, and resistance bands.  The patient has recently started physical therapy for neck stiffness and arthritis. She reports that the therapy has increased her exercise frequency. She also mentions a recent cortisone injection in the neck, which resulted in a three-day headache.  Despite the recent weight gain, the patient is making efforts to increase protein intake through yogurt cups and jerky sticks. She reports that these changes have helped to curb nighttime hunger. The patient acknowledges the need for better meal planning, particularly given the demands of her son's basketball  schedule.          PHYSICAL EXAM:  Blood pressure 119/81, pulse 98, temperature 98 F (36.7 C), height 5\' 3"  (1.6 m), weight 136 lb (61.7 kg), SpO2 100%. Body mass index is 24.09 kg/m.  DIAGNOSTIC DATA REVIEWED:  BMET    Component Value Date/Time   NA 142 01/02/2023 0905   K 4.0 01/02/2023 0905   CL 103 01/02/2023 0905   CO2 22 01/02/2023 0905   GLUCOSE 79 01/02/2023 0905   GLUCOSE 137 (H) 10/16/2018 0828   BUN 10 01/02/2023 0905   CREATININE 0.95 01/02/2023 0905   CALCIUM 10.0 01/02/2023 0905   GFRNONAA 76 06/01/2020 1434   GFRAA 88 06/01/2020 1434   Lab Results  Component Value Date   HGBA1C 4.9 01/02/2023   HGBA1C 5.4 06/01/2020   Lab Results  Component Value Date   INSULIN 7.2 01/02/2023   INSULIN 10.8 06/01/2020   Lab Results  Component Value Date   TSH 1.070 06/01/2020   CBC    Component Value Date/Time   WBC 8.6 06/01/2020 1434   WBC 16.0 (H) 10/18/2018 0421   RBC 4.28 06/01/2020 1434   RBC 3.38 (L) 10/18/2018 0421   HGB 12.8 06/01/2020 1434   HCT 40.1 06/01/2020 1434   PLT 319 06/01/2020 1434   MCV 94 06/01/2020 1434   MCH 29.9 06/01/2020 1434   MCH 30.2 10/18/2018 0421   MCHC 31.9 06/01/2020 1434   MCHC 33.1 10/18/2018 0421   RDW 12.8 06/01/2020 1434   Iron Studies    Component Value Date/Time  IRON 95 06/01/2020 1434   TIBC 315 06/01/2020 1434   FERRITIN 228 (H) 06/01/2020 1434   IRONPCTSAT 30 06/01/2020 1434   Lipid Panel     Component Value Date/Time   CHOL 205 (H) 01/02/2023 0905   TRIG 70 01/02/2023 0905   HDL 75 01/02/2023 0905   CHOLHDL 3.3 02/07/2020 1003   CHOLHDL 3.2 10/23/2009 0730   VLDL 9 10/23/2009 0730   LDLCALC 117 (H) 01/02/2023 0905   Hepatic Function Panel     Component Value Date/Time   PROT 7.8 01/02/2023 0905   ALBUMIN 5.2 (H) 01/02/2023 0905   AST 16 01/02/2023 0905   ALT 8 01/02/2023 0905   ALKPHOS 101 01/02/2023 0905   BILITOT 0.4 01/02/2023 0905      Component Value Date/Time   TSH 1.070  06/01/2020 1434   Nutritional Lab Results  Component Value Date   VD25OH 30.0 01/02/2023   VD25OH 39.6 06/01/2020     Assessment and Plan    Obesity with polyphagia Recent weight gain of 2 pounds in the context of increased stress and eating out more frequently. Patient is on Zepbound 10mg  weekly for polyphasia. She is exercising 30-60 minutes five times a week and has a calorie goal of 1200 calories per day with 75 or more grams of protein. -Continue Zepbound 10mg  weekly. -Encourage consistent food journaling and adherence to calorie and protein goals. -Plan for follow-up before Christmas to discuss strategy.  Neck Pain Recent cortisone shot in the neck caused a three-day headache. Patient is currently in physical therapy and has started dry needling. -Continue physical therapy and dry needling as tolerated. -Continue current pain management regimen.  General Health Maintenance -Continue current exercise regimen of stretching, walking, and resistance bands. -Plan for meal prep to avoid eating out, especially during high school basketball season.          She was informed of the importance of frequent follow up visits to maximize her success with intensive lifestyle modifications for her multiple health conditions.    Quillian Quince, MD

## 2023-11-14 DIAGNOSIS — D649 Anemia, unspecified: Secondary | ICD-10-CM | POA: Insufficient documentation

## 2023-11-14 DIAGNOSIS — S0990XA Unspecified injury of head, initial encounter: Secondary | ICD-10-CM | POA: Diagnosis present

## 2023-11-14 DIAGNOSIS — W01198A Fall on same level from slipping, tripping and stumbling with subsequent striking against other object, initial encounter: Secondary | ICD-10-CM | POA: Diagnosis not present

## 2023-11-14 DIAGNOSIS — R42 Dizziness and giddiness: Secondary | ICD-10-CM | POA: Diagnosis not present

## 2023-11-14 DIAGNOSIS — Y9389 Activity, other specified: Secondary | ICD-10-CM | POA: Diagnosis not present

## 2023-11-14 DIAGNOSIS — S0083XA Contusion of other part of head, initial encounter: Secondary | ICD-10-CM | POA: Insufficient documentation

## 2023-11-15 ENCOUNTER — Emergency Department (HOSPITAL_COMMUNITY)
Admission: EM | Admit: 2023-11-15 | Discharge: 2023-11-15 | Disposition: A | Discharge status: Home / Self Care | Payer: No Typology Code available for payment source | Attending: Emergency Medicine | Admitting: Emergency Medicine

## 2023-11-15 ENCOUNTER — Encounter (HOSPITAL_COMMUNITY): Payer: Self-pay | Admitting: Emergency Medicine

## 2023-11-15 ENCOUNTER — Other Ambulatory Visit: Payer: Self-pay

## 2023-11-15 ENCOUNTER — Emergency Department (HOSPITAL_COMMUNITY): Payer: No Typology Code available for payment source

## 2023-11-15 DIAGNOSIS — S0083XA Contusion of other part of head, initial encounter: Secondary | ICD-10-CM

## 2023-11-15 DIAGNOSIS — D649 Anemia, unspecified: Secondary | ICD-10-CM

## 2023-11-15 DIAGNOSIS — R42 Dizziness and giddiness: Secondary | ICD-10-CM

## 2023-11-15 LAB — CBC
HCT: 32.3 % — ABNORMAL LOW (ref 36.0–46.0)
Hemoglobin: 10.7 g/dL — ABNORMAL LOW (ref 12.0–15.0)
MCH: 31.4 pg (ref 26.0–34.0)
MCHC: 33.1 g/dL (ref 30.0–36.0)
MCV: 94.7 fL (ref 80.0–100.0)
Platelets: 276 10*3/uL (ref 150–400)
RBC: 3.41 MIL/uL — ABNORMAL LOW (ref 3.87–5.11)
RDW: 13.6 % (ref 11.5–15.5)
WBC: 9.1 10*3/uL (ref 4.0–10.5)
nRBC: 0 % (ref 0.0–0.2)

## 2023-11-15 LAB — CBG MONITORING, ED: Glucose-Capillary: 79 mg/dL (ref 70–99)

## 2023-11-15 LAB — BASIC METABOLIC PANEL
Anion gap: 8 (ref 5–15)
BUN: 16 mg/dL (ref 8–23)
CO2: 23 mmol/L (ref 22–32)
Calcium: 8.8 mg/dL — ABNORMAL LOW (ref 8.9–10.3)
Chloride: 106 mmol/L (ref 98–111)
Creatinine, Ser: 0.99 mg/dL (ref 0.44–1.00)
GFR, Estimated: >60 mL/min (ref >60)
Glucose, Bld: 99 mg/dL (ref 70–99)
Potassium: 3.7 mmol/L (ref 3.5–5.1)
Sodium: 137 mmol/L (ref 135–145)

## 2023-11-15 LAB — URINALYSIS, ROUTINE W REFLEX MICROSCOPIC
Bilirubin Urine: NEGATIVE
Glucose, UA: NEGATIVE mg/dL
Hgb urine dipstick: NEGATIVE
Ketones, ur: NEGATIVE mg/dL
Leukocytes,Ua: NEGATIVE
Nitrite: NEGATIVE
Protein, ur: NEGATIVE mg/dL
Specific Gravity, Urine: 1.024 (ref 1.005–1.030)
pH: 6 (ref 5.0–8.0)

## 2023-11-15 MED ORDER — ACETAMINOPHEN 500 MG PO TABS
1000.0000 mg | ORAL_TABLET | Freq: Once | ORAL | Status: AC
Start: 2023-11-15 — End: 2023-11-15
  Administered 2023-11-15: 1000 mg via ORAL
  Filled 2023-11-15: qty 2

## 2023-11-15 NOTE — ED Triage Notes (Signed)
Pt arrives via POV ambulatory to triage accompanied by S/o Sherilyn Cooter. Pt sts she was bending over when she felt dizzy subsequently falling into a doorway. Presents with head injury, has hematoma to forehead. Denies dizziness at this time. Reports head pain 10/10, pain associated with nausea - no vomiting - and light sensitivity. No neck pain. No other injury from fall. No blood thinner.

## 2023-11-15 NOTE — ED Notes (Signed)
Requested provider assessment in triage. Pt remains in triage 2 for available PA

## 2023-11-15 NOTE — ED Provider Notes (Signed)
[place] EMERGENCY DEPARTMENT AT [facility] Provider Note   CSN: [id number] Arrival date & time: 11/14/23  2359     History  Chief Complaint  Patient presents with   Dizziness   Fall    Ronnald Ramp Jacqulyn Amesquita is a 62 y.o. female.  The history is provided by the patient.  Dizziness Fall  Shundreka Offord is a 62 y.o. female who presents to the Emergency Department complaining of dizziness, fall.  She presents to the emergency department for evaluation of injuries after she got dizzy and fell.  She states that she was cleaning up in the closet when she bent forward and she got dizzy with a spinning sensation and she struck her head on the closet door.  She did not lose consciousness but she had associated nausea, pain to her head.  Dizziness has resolved.  No chest pain, difficulty breathing, palpitations, vomiting, abdominal pain.  No hematochezia or melena.  She has no known medical problems and takes no routine medications.  No numbness or weakness.     Home Medications Prior to Admission medications   Medication Sig Start Date End Date Taking? Authorizing Provider  Azelastine-Fluticasone 137-50 MCG/ACT SUSP Place 1 spray into the nose 2 (two) times daily. 03/27/23   Marcelyn Bruins, MD  Bromfenac Sodium 0.07 % SOLN INSTILL 1 DROP IN THE RIGHT EYE THREE TIMES A DAY    [provider]  Cholecalciferol (VITAMIN D) 50 MCG (2000 UT) CAPS Take 2 capsules (4,000 Units total) by mouth daily. 01/30/23   Whitmire, Dawn, FNP  Difluprednate 0.05 % EMUL INSTILL 1 DROP IN RIGHT EYE FOUR TIMES DAILY FOR 7 DAYS    [provider]  EPINEPHrine 0.3 mg/0.3 mL IJ SOAJ injection Inject 0.3 mg into the muscle as needed for anaphylaxis. 02/13/23   Marcelyn Bruins, MD  hydrOXYzine (ATARAX/VISTARIL) 25 MG tablet TAKE 1 TABLET BY MOUTH AT BEDTIME AS NEEDED. 05/02/21   Hoy Register, MD  levocetirizine (XYZAL) 5 MG tablet Take 1 tablet (5 mg total) by  mouth every evening. 02/13/23   Marcelyn Bruins, MD  Multiple Vitamin (MULTIVITAMIN) tablet Take 2 tablets by mouth daily.    [provider]  Multiple Vitamins-Minerals (AIRBORNE PO) Take by mouth.    [provider]  OVER THE COUNTER MEDICATION [place] Vitamin    [provider]  tirzepatide (ZEPBOUND) 10 MG/0.5ML Pen Inject 10 mg into the skin once a week. 10/09/23   Quillian Quince D, MD  traZODone (DESYREL) 100 MG tablet Take 100 mg by mouth at bedtime. 07/30/22   [provider]      Allergies    Sulfa antibiotics, Tape, Clotrimazole, Egg-derived products, Hydrocodone, and Shellfish allergy    Review of Systems   Review of Systems  Neurological:  Positive for dizziness.  All other systems reviewed and are negative.   Physical Exam Updated Vital Signs BP (!) 145/96   Pulse 75   Temp 98 F (36.7 C) (Oral)   Resp (!) 22   Ht 5\' 3"  (1.6 m)   Wt 61.7 kg   SpO2 100%   BMI 24.09 kg/m  Physical Exam Vitals and nursing note reviewed.  Constitutional:      Appearance: She is well-developed.  HENT:     Head: Normocephalic.     Comments: Hematoma to right forehead Eyes:     Extraocular Movements: Extraocular movements intact.     Pupils: Pupils are equal, round, and reactive to light.  Cardiovascular:     Rate and Rhythm: Normal rate and regular rhythm.     Heart sounds: No murmur heard. Pulmonary:     Effort: Pulmonary effort is normal. No respiratory distress.     Breath sounds: Normal breath sounds.  Abdominal:     Palpations: Abdomen is soft.     Tenderness: There is no abdominal tenderness. There is no guarding or rebound.  Musculoskeletal:        General: No tenderness.  Skin:    General: Skin is warm and dry.  Neurological:     Mental Status: She is alert and oriented to person, place, and time.     Comments: No asymmetry of facial movements.  Visual fields grossly intact.  5 out of 5 strength in all 4 extremities with  sensation to light touch intact in all 4 extremities.  Psychiatric:        Behavior: Behavior normal.     ED Results / Procedures / Treatments   Labs (all labs ordered are listed, but only abnormal results are displayed) Labs Reviewed  BASIC METABOLIC PANEL - Abnormal; Notable for the following components:      Result Value   Calcium 8.8 (*)    All other components within normal limits  CBC - Abnormal; Notable for the following components:   RBC 3.41 (*)    Hemoglobin 10.7 (*)    HCT 32.3 (*)    All other components within normal limits  URINALYSIS, ROUTINE W REFLEX MICROSCOPIC  CBG MONITORING, ED    EKG EKG Interpretation Date/Time:  Saturday November 15 2023 00:27:33 EST Ventricular Rate:  67 PR Interval:  132 QRS Duration:  124 QT Interval:  444 QTC Calculation: 469 R Axis:   75  Text Interpretation: Normal sinus rhythm Right bundle branch block Abnormal ECG Confirmed by Tilden Fossa [phone]) on 11/15/2023 2:38:34 AM  Radiology CT Head Wo Contrast Result Date: 11/15/2023 CLINICAL DATA:  Blunt polytrauma, fall injury with frontal head impact. EXAM: CT HEAD WITHOUT CONTRAST CT CERVICAL SPINE WITHOUT CONTRAST TECHNIQUE: Multidetector CT imaging of the head and cervical spine was performed following the standard protocol without intravenous contrast. Multiplanar CT image reconstructions of the cervical spine were also generated. RADIATION DOSE REDUCTION: This exam was performed according to the departmental dose-optimization program which includes automated exposure control, adjustment of the mA and/or kV according to patient size and/or use of iterative reconstruction technique. COMPARISON:  None Available. FINDINGS: CT HEAD FINDINGS Brain: No evidence of acute infarction, hemorrhage, hydrocephalus, extra-axial collection or mass lesion/mass effect. There is mild cerebral cortical volume loss without evidence of significant small-vessel disease. There are benign dystrophic  dural calcifications in the falx at the vertex. Vascular: There are calcifications in both carotid siphons. No hyperdense central vessel is seen. Skull: Right frontal scalp hematoma in the forehead. Negative for fractures or focal skull lesions. Sinuses/Orbits: No acute findings. Other: None. CT CERVICAL SPINE FINDINGS Alignment: There is a reversed cervical lordosis without evidence of listhesis. There is bone-on-bone anterior atlantodental joint space loss with osteophytes. Skull base and vertebrae: No acute fracture is evident. No primary bone lesion or focal pathologic process. Soft tissues and spinal canal: No prevertebral fluid or swelling. No visible canal hematoma. Disc levels:  The discs are normal in height only at C2-3 and C7-T1. At the intervening levels there is disc space loss with bidirectional margin osteophytes. Posterior disc osteophyte complexes at these 4 levels are present but do not cause cord compression. No herniated discs  or cord compromise are seen. There is mild facet joint and uncinate spurring C3-6, but only mild foraminal stenosis at C4-5 and C5-6. Other foramina are clear.  There are no bulky bridging osteophytes. Upper chest: Negative. Other: None. IMPRESSION: 1. Right frontal scalp hematoma without evidence of depressed skull fractures or acute intracranial CT findings. 2. Reversed cervical lordosis without evidence of fractures or listhesis. 3. 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 Electronically Signed   By: Almira Bar M.D.   On: 11/15/2023 02:55   CT Cervical Spine Wo Contrast Result Date: 11/15/2023 CLINICAL DATA:  Blunt polytrauma, fall injury with frontal head impact. EXAM: CT HEAD WITHOUT CONTRAST CT CERVICAL SPINE WITHOUT CONTRAST TECHNIQUE: Multidetector CT imaging of the head and cervical spine was performed following the standard protocol without intravenous contrast. Multiplanar CT image reconstructions of the cervical spine  were also generated. RADIATION DOSE REDUCTION: This exam was performed according to the departmental dose-optimization program which includes automated exposure control, adjustment of the mA and/or kV according to patient size and/or use of iterative reconstruction technique. COMPARISON:  None Available. FINDINGS: CT HEAD FINDINGS Brain: No evidence of acute infarction, hemorrhage, hydrocephalus, extra-axial collection or mass lesion/mass effect. There is mild cerebral cortical volume loss without evidence of significant small-vessel disease. There are benign dystrophic dural calcifications in the falx at the vertex. Vascular: There are calcifications in both carotid siphons. No hyperdense central vessel is seen. Skull: Right frontal scalp hematoma in the forehead. Negative for fractures or focal skull lesions. Sinuses/Orbits: No acute findings. Other: None. CT CERVICAL SPINE FINDINGS Alignment: There is a reversed cervical lordosis without evidence of listhesis. There is bone-on-bone anterior atlantodental joint space loss with osteophytes. Skull base and vertebrae: No acute fracture is evident. No primary bone lesion or focal pathologic process. Soft tissues and spinal canal: No prevertebral fluid or swelling. No visible canal hematoma. Disc levels:  The discs are normal in height only at C2-3 and C7-T1. At the intervening levels there is disc space loss with bidirectional margin osteophytes. Posterior disc osteophyte complexes at these 4 levels are present but do not cause cord compression. No herniated discs or cord compromise are seen. There is mild facet joint and uncinate spurring C3-6, but only mild foraminal stenosis at C4-5 and C5-6. Other foramina are clear.  There are no bulky bridging osteophytes. Upper chest: Negative. Other: None. IMPRESSION: 1. Right frontal scalp hematoma without evidence of depressed skull fractures or acute intracranial CT findings. 2. Reversed cervical lordosis without evidence  of fractures or listhesis. 3. 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 Electronically Signed   By: Almira Bar M.D.   On: 11/15/2023 02:55    Procedures Procedures    Medications Ordered in ED Medications  acetaminophen (TYLENOL) tablet 1,000 mg (1,000 mg Oral Given 11/15/23 0341)    ED Course/ Medical Decision Making/ A&P                                 Medical Decision Making Amount and/or Complexity of Data Reviewed Labs: ordered.  Risk OTC drugs.   Patient here for evaluation of injuries after she got dizzy bending over and struck her head.  Dizziness has resolved.  She is neurologically intact on examination.  No evidence of acute CVA, serious intracranial injury on imaging.  Presentation is not consistent with central vertigo.  CBC with mild anemia, no recent bleeding.  Discussed that she will need follow-up regarding her anemia.  Discussed with patient home care for contusion as well as outpatient follow-up and return precautions.  No significant electrolyte abnormality on BMP.  Presentation is not consistent with arrhythmia, PE.        Final Clinical Impression(s) / ED Diagnoses Final diagnoses:  Dizziness  Anemia, unspecified type  Contusion of forehead, initial encounter    Rx / DC Orders ED Discharge Orders     None         Tilden Fossa, MD 11/15/23 0425

## 2023-11-15 NOTE — ED Notes (Signed)
Pt continues to await in triage - no change in s/s

## 2023-11-24 ENCOUNTER — Other Ambulatory Visit (INDEPENDENT_AMBULATORY_CARE_PROVIDER_SITE_OTHER): Payer: Self-pay | Admitting: Family Medicine

## 2023-11-24 ENCOUNTER — Ambulatory Visit (INDEPENDENT_AMBULATORY_CARE_PROVIDER_SITE_OTHER): Payer: No Typology Code available for payment source | Admitting: Family Medicine

## 2023-11-24 DIAGNOSIS — E669 Obesity, unspecified: Secondary | ICD-10-CM

## 2023-11-24 DIAGNOSIS — R632 Polyphagia: Secondary | ICD-10-CM

## 2023-11-24 MED ORDER — ZEPBOUND 10 MG/0.5ML ~~LOC~~ SOAJ: 10.0000 mg | SUBCUTANEOUS | 0 refills | Status: DC

## 2023-11-24 NOTE — Telephone Encounter (Signed)
12/23 Cld pt to r/s appt. Dr. Dalbert Garnet out of the office due to illness. Pt states that she needs a refill of Zepbound since appt has been rescheduled for January 2025. [facility].

## 2023-11-24 NOTE — Telephone Encounter (Signed)
Rescheduled from [place] schedule today.  LAST APPOINTMENT DATE: 10/09/2023 NEXT APPOINTMENT DATE: 12/23/2023   [id number] - [place], [place] - [address] [place]  [address] Phone: [phone] Fax: [phone]  [facility] - [place], [place] - [phone] [place] [address] Phone: [phone] Fax: [phone]  Patient is requesting a refill of the following medications: Requested Prescriptions   Pending Prescriptions Disp Refills   tirzepatide (ZEPBOUND) 10 MG/0.5ML Pen 2 mL 0    Sig: Inject 10 mg into the skin once a week.    Date last filled: 10/09/2023 Previously prescribed by Dr. Dalbert Garnet  Lab Results  Component Value Date   HGBA1C 4.9 01/02/2023   HGBA1C 5.4 06/01/2020   Lab Results  Component Value Date   LDLCALC 117 (H) 01/02/2023   CREATININE 0.99 11/15/2023   Lab Results  Component Value Date   VD25OH 30.0 01/02/2023   VD25OH 39.6 06/01/2020    BP Readings from Last 3 Encounters:  11/15/23 (!) 145/96  10/09/23 119/81  09/11/23 125/78

## 2023-12-23 ENCOUNTER — Encounter (INDEPENDENT_AMBULATORY_CARE_PROVIDER_SITE_OTHER): Payer: Self-pay | Admitting: Family Medicine

## 2023-12-23 ENCOUNTER — Ambulatory Visit (INDEPENDENT_AMBULATORY_CARE_PROVIDER_SITE_OTHER): Payer: No Typology Code available for payment source | Admitting: Family Medicine

## 2023-12-23 VITALS — BP 115/73 | HR 74 | Temp 98.0°F | Ht 63.0 in | Wt 128.0 lb

## 2023-12-23 DIAGNOSIS — E559 Vitamin D deficiency, unspecified: Secondary | ICD-10-CM

## 2023-12-23 DIAGNOSIS — E669 Obesity, unspecified: Secondary | ICD-10-CM | POA: Diagnosis not present

## 2023-12-23 DIAGNOSIS — Z6822 Body mass index (BMI) 22.0-22.9, adult: Secondary | ICD-10-CM

## 2023-12-23 DIAGNOSIS — G4709 Other insomnia: Secondary | ICD-10-CM

## 2023-12-23 DIAGNOSIS — G4700 Insomnia, unspecified: Secondary | ICD-10-CM | POA: Diagnosis not present

## 2023-12-23 NOTE — Progress Notes (Signed)
.smr  Office: [phone]  /  Fax: [phone]  WEIGHT SUMMARY AND BIOMETRICS  Anthropometric Measurements Height: 5\' 3"  (1.6 m) Weight: 128 lb (58.1 kg) BMI (Calculated): 22.68 Weight at Last Visit: 136 lb Weight Lost Since Last Visit: 8 lb Weight Gained Since Last Visit: 0 Starting Weight: 186 lb Total Weight Loss (lbs): 58 lb (26.3 kg)   Body Composition  Body Fat %: 0 % (No Strip due to patient having on tights)   Other Clinical Data Fasting: No Labs: No Today's Visit #: 31 Starting Date: 06/01/20    Chief Complaint: OBESITY    History of Present Illness   The patient, with a history of obesity and vitamin D deficiency, presents for a follow-up visit. She has been on prescription vitamin D and Zeppelin for weight loss, and has lost eight pounds in the last two months. She reports adhering to a 1200 calorie diet about 50% of the time and exercising, primarily cardio, for 30 minutes five times per week.  Recently, the patient participated in a church fast, the [employer], which involved consuming only vegetables, some fruit, and water. She reports significant weight loss during this period. However, she also expresses concerns about the nutritional adequacy of this diet and has incorporated tofu and mushrooms for protein.  The patient's insurance company recently discontinued coverage for her weight loss medication, Zepbound. She has one remaining prescription which she plans to taper off, taking it every other week. She expresses concerns about managing hunger levels without the medication.  The patient also reports issues with sleep, which began while she was pursuing her master's degree. She has been taking Trazodone, which she finds effective in shutting down her mind and enabling sleep.  The patient's primary challenge in managing her weight is late-night cravings, particularly for sweet foods. She has developed strategies to manage these cravings, such as  consuming a piece of peppermint or a yogurt with water. She is considering a trip in the next six weeks but is unsure due to concerns about maintaining her diet and exercise regimen while traveling.          PHYSICAL EXAM:  Blood pressure 115/73, pulse 74, temperature 98 F (36.7 C), height 5\' 3"  (1.6 m), weight 128 lb (58.1 kg), SpO2 100%. Body mass index is 22.67 kg/m.  DIAGNOSTIC DATA REVIEWED:  BMET    Component Value Date/Time   NA 137 11/15/2023 0022   NA 142 01/02/2023 0905   K 3.7 11/15/2023 0022   CL 106 11/15/2023 0022   CO2 23 11/15/2023 0022   GLUCOSE 99 11/15/2023 0022   BUN 16 11/15/2023 0022   BUN 10 01/02/2023 0905   CREATININE 0.99 11/15/2023 0022   CALCIUM 8.8 (L) 11/15/2023 0022   GFRNONAA >60 11/15/2023 0022   GFRAA 88 06/01/2020 1434   Lab Results  Component Value Date   HGBA1C 4.9 01/02/2023   HGBA1C 5.4 06/01/2020   Lab Results  Component Value Date   INSULIN 7.2 01/02/2023   INSULIN 10.8 06/01/2020   Lab Results  Component Value Date   TSH 1.070 06/01/2020   CBC    Component Value Date/Time   WBC 9.1 11/15/2023 0022   RBC 3.41 (L) 11/15/2023 0022   HGB 10.7 (L) 11/15/2023 0022   HGB 12.8 06/01/2020 1434   HCT 32.3 (L) 11/15/2023 0022   HCT 40.1 06/01/2020 1434   PLT 276 11/15/2023 0022   PLT 319 06/01/2020 1434   MCV 94.7 11/15/2023 0022   MCV  94 06/01/2020 1434   MCH 31.4 11/15/2023 0022   MCHC 33.1 11/15/2023 0022   RDW 13.6 11/15/2023 0022   RDW 12.8 06/01/2020 1434   Iron Studies    Component Value Date/Time   IRON 95 06/01/2020 1434   TIBC 315 06/01/2020 1434   FERRITIN 228 (H) 06/01/2020 1434   IRONPCTSAT 30 06/01/2020 1434   Lipid Panel     Component Value Date/Time   CHOL 205 (H) 01/02/2023 0905   TRIG 70 01/02/2023 0905   HDL 75 01/02/2023 0905   CHOLHDL 3.3 02/07/2020 1003   CHOLHDL 3.2 10/23/2009 0730   VLDL 9 10/23/2009 0730   LDLCALC 117 (H) 01/02/2023 0905   Hepatic Function Panel     Component  Value Date/Time   PROT 7.8 01/02/2023 0905   ALBUMIN 5.2 (H) 01/02/2023 0905   AST 16 01/02/2023 0905   ALT 8 01/02/2023 0905   ALKPHOS 101 01/02/2023 0905   BILITOT 0.4 01/02/2023 0905      Component Value Date/Time   TSH 1.070 06/01/2020 1434   Nutritional Lab Results  Component Value Date   VD25OH 30.0 01/02/2023   VD25OH 39.6 06/01/2020     Assessment and Plan    Obesity   Patient has lost eight pounds in the last two months on Zepbound and a 1200 calorie diet, despite being at her weight goal. She exercises cardio for 30 minutes five times per week. Risk of malnutrition due to recent [employer] has stopped covering Zepbound; patient has one prescription left. Discussed tapering Zepbound to every other week to acclimate to hunger levels. Provided a handout on a healthier version of the [employer].   - Taper Zepbound to every other week.   - Provide a handout on a healthier version of the [employer].   - Monitor nutritional intake, goal is to maintain weight loss and not lose weight further.  Insomnia   Long-standing insomnia since master's program. Trazodone has been effective in aiding sleep.   - Continue trazodone.  -importance of sleep to health including weight maintenance discussed today   Vitamin D Deficiency   Patient is on prescription vitamin D.   - Continue prescription vitamin D.    General Health Maintenance   Patient exercises regularly using a Peloton bike and treadmill. She manages late-night cravings with healthier snack options and hydrates well during the fast.   - Encourage continued exercise and hydration.   - Discuss healthier snack options for late-night cravings.    Follow-up   - Schedule follow-up appointment in six weeks.         I have personally spent 30 minutes total time today in preparation, patient care, and documentation for this visit, including the following: review of clinical lab tests; review of medical  tests/procedures/services.    She was informed of the importance of frequent follow up visits to maximize her success with intensive lifestyle modifications for her multiple health conditions.    Quillian Quince, MD

## 2024-01-12 NOTE — Progress Notes (Deleted)
 63 y.o. G2P0000 Significant Other African American female here for annual exam.    PCP: Barnie Mort, PA-C   No LMP recorded. Patient has had a hysterectomy.           Sexually active: No.  The current method of family planning is status post hysterectomy.    Menopausal hormone therapy:  n/a Exercising: {yes no:314532}  {types:19826} Smoker:  no  OB History  Gravida Para Term Preterm AB Living  2 2 0 0 0   SAB IAB Ectopic Multiple Live Births  0 0 0      # Outcome Date GA Lbr Len/2nd Weight Sex Type Anes PTL Lv  2 Para           1 Para              HEALTH MAINTENANCE: Last 2 paps:  2010 History of abnormal Pap or positive HPV:  yes Mammogram:   05/19/23 Breast Density Cat C, BI-RADS CAT 1 neg Colonoscopy:  11/18/22 Bone Density:  n/a  Result  n/a   Immunization History  Administered Date(s) Administered   PFIZER(Purple Top)SARS-COV-2 Vaccination 09/19/2020   Pfizer(Comirnaty)Fall Seasonal Vaccine 12 years and older 10/31/2022      reports that she has never smoked. She has never used smokeless tobacco. She reports that she does not drink alcohol and does not use drugs.  Past Medical History:  Diagnosis Date   Anemia    Arthritis    Asthma    Blood transfusion without reported diagnosis 11/2018   Chest pain    Depression    Fibroid    GERD (gastroesophageal reflux disease)    Insomnia    Joint pain    Multiple food allergies    Osteoarthritis    Palpitations    Shortness of breath    Vitamin D deficiency     Past Surgical History:  Procedure Laterality Date   ABDOMINAL HYSTERECTOMY     [facility]   CORONARY ANGIOGRAM  Nov 2010   Normal coronary arteries   ESOPHAGEAL MANOMETRY N/A 08/12/2018   Procedure: ESOPHAGEAL MANOMETRY (EM);  Surgeon: Vida Rigger, MD;  Location: [facility];  Service: Endoscopy;  Laterality: N/A;   HERNIA REPAIR     INSERTION OF MESH N/A 10/14/2018   Procedure: INSERTION OF MESH;  Surgeon: Karie Soda, MD;  Location: [facility];   Service: General;  Laterality: N/A;   KNEE SURGERY     OOPHORECTOMY      Current Outpatient Medications  Medication Sig Dispense Refill   Azelastine-Fluticasone 137-50 MCG/ACT SUSP Place 1 spray into the nose 2 (two) times daily. 23 g 5   Bromfenac Sodium 0.07 % SOLN INSTILL 1 DROP IN THE RIGHT EYE THREE TIMES A DAY     Cholecalciferol (VITAMIN D) 50 MCG (2000 UT) CAPS Take 2 capsules (4,000 Units total) by mouth daily. 30 capsule 0   Difluprednate 0.05 % EMUL INSTILL 1 DROP IN RIGHT EYE FOUR TIMES DAILY FOR 7 DAYS     EPINEPHrine 0.3 mg/0.3 mL IJ SOAJ injection Inject 0.3 mg into the muscle as needed for anaphylaxis. 2 each 2   hydrOXYzine (ATARAX/VISTARIL) 25 MG tablet TAKE 1 TABLET BY MOUTH AT BEDTIME AS NEEDED. 30 tablet 0   levocetirizine (XYZAL) 5 MG tablet Take 1 tablet (5 mg total) by mouth every evening. 90 tablet 1   Multiple Vitamin (MULTIVITAMIN) tablet Take 2 tablets by mouth daily.     Multiple Vitamins-Minerals (AIRBORNE PO) Take by mouth.  OVER THE COUNTER MEDICATION [place] Vitamin     tirzepatide (ZEPBOUND) 10 MG/0.5ML Pen Inject 10 mg into the skin once a week. 2 mL 0   traZODone (DESYREL) 100 MG tablet Take 100 mg by mouth at bedtime.     No current facility-administered medications for this visit.    ALLERGIES: Sulfa antibiotics, Tape, Clotrimazole, Egg-derived products, Hydrocodone, and Shellfish allergy  Family History  Problem Relation Age of Onset   Hypertension Mother    Heart disease Mother    Stroke Mother    Obesity Mother    Hypertension Father    Lung cancer Father 67       smoked   Breast cancer Father 48 - 61       recurred in 27s   Thyroid cancer Sister 43 - 60   Ovarian cancer Sister 65   Heart attack Brother 44   Cancer Maternal Aunt        unknown type   Cancer Maternal Aunt        unknown type   Cancer Maternal Uncle        unknown type   Cancer Paternal Aunt        unknown type   Cancer Paternal Aunt        unknown type   Cancer  Paternal Grandfather        unknown type   Cancer Half-Sister        paternal half-sister, unknown type of cancer   Cancer Half-Brother        paternal half-brother, blood cancer    Review of Systems  PHYSICAL EXAM:  There were no vitals taken for this visit.    General appearance: alert, cooperative and appears stated age Head: normocephalic, without obvious abnormality, atraumatic Neck: no adenopathy, supple, symmetrical, trachea midline and thyroid normal to inspection and palpation Lungs: clear to auscultation bilaterally Breasts: normal appearance, no masses or tenderness, No nipple retraction or dimpling, No nipple discharge or bleeding, No axillary adenopathy Heart: regular rate and rhythm Abdomen: soft, non-tender; no masses, no organomegaly Extremities: extremities normal, atraumatic, no cyanosis or edema Skin: skin color, texture, turgor normal. No rashes or lesions Lymph nodes: cervical, supraclavicular, and axillary nodes normal. Neurologic: grossly normal  Pelvic: External genitalia:  no lesions              No abnormal inguinal nodes palpated.              Urethra:  normal appearing urethra with no masses, tenderness or lesions              Bartholins and Skenes: normal                 Vagina: normal appearing vagina with normal color and discharge, no lesions              Cervix: no lesions              Pap taken: {yes no:314532} Bimanual Exam:  Uterus:  normal size, contour, position, consistency, mobility, non-tender              Adnexa: no mass, fullness, tenderness              Rectal exam: {yes no:314532}.  Confirms.              Anus:  normal sphincter tone, no lesions  Chaperone was present for exam:  {BSCHAPERONE:31226::"Dama Hedgepeth F, CMA"}  ASSESSMENT: Well woman visit with gynecologic exam  ***  PLAN:  Mammogram screening discussed. Self breast awareness reviewed. Pap and HRV collected:  {yes no:314532} Guidelines for Calcium, Vitamin D, regular  exercise program including cardiovascular and weight bearing exercise. Medication refills:  *** {LABS (Optional):23779} Follow up:  ***    Additional counseling given.  {yes [id number]. ***  total time was spent for this patient encounter, including preparation, face-to-face counseling with the patient, coordination of care, and documentation of the encounter in addition to doing the well woman visit with gynecologic exam.

## 2024-01-26 ENCOUNTER — Ambulatory Visit: Payer: No Typology Code available for payment source | Admitting: Obstetrics and Gynecology

## 2024-02-09 ENCOUNTER — Encounter (INDEPENDENT_AMBULATORY_CARE_PROVIDER_SITE_OTHER): Payer: Self-pay | Admitting: Family Medicine

## 2024-02-09 ENCOUNTER — Ambulatory Visit (INDEPENDENT_AMBULATORY_CARE_PROVIDER_SITE_OTHER): Payer: No Typology Code available for payment source | Admitting: Family Medicine

## 2024-02-09 VITALS — BP 100/66 | HR 82 | Temp 98.1°F | Ht 63.0 in | Wt 125.0 lb

## 2024-02-09 DIAGNOSIS — R632 Polyphagia: Secondary | ICD-10-CM

## 2024-02-09 DIAGNOSIS — J309 Allergic rhinitis, unspecified: Secondary | ICD-10-CM

## 2024-02-09 DIAGNOSIS — E669 Obesity, unspecified: Secondary | ICD-10-CM

## 2024-02-09 DIAGNOSIS — Z6822 Body mass index (BMI) 22.0-22.9, adult: Secondary | ICD-10-CM | POA: Diagnosis not present

## 2024-02-09 NOTE — Progress Notes (Signed)
 Office: [phone]  /  Fax: [phone]  WEIGHT SUMMARY AND BIOMETRICS  Anthropometric Measurements Height: 5\' 3"  (1.6 m) Weight: 125 lb (56.7 kg) BMI (Calculated): 22.15 Weight at Last Visit: 128lb Weight Lost Since Last Visit: 3lb Weight Gained Since Last Visit: 0 Starting Weight: 186lb Total Weight Loss (lbs): 61 lb (27.7 kg)   Body Composition  Body Fat %: 32.9 % Fat Mass (lbs): 41.2 lbs Muscle Mass (lbs): 79.6 lbs Total Body Water (lbs): 54.6 lbs Visceral Fat Rating : 7   Other Clinical Data Fasting: yes Labs: no Today's Visit #: 32 Starting Date: 06/01/20    Chief Complaint: OBESITY   History of Present Illness   The patient presents to discuss her obesity treatment plan and monitor her progress.  She has lost three pounds in the last six weeks, attributing this to avoiding sweets and maintaining a calorie goal of 1200 calories with at least 75 grams of protein daily. She journals her food intake and feels she is adhering to her dietary plan successfully. She also uses protein shakes in the morning to help meet her protein goals.   She exercises 30 to 60 minutes five times per week, engaging in activities such as treadmill, biking, and walking. She enjoys the exercise routine but is still adjusting to using the Peloton bike, as she has not yet found a class that suits her.  She was previously on Zepbound 10 mg weekly for polyphagia but has been off the medication for about two to three weeks due to insurance issues. She is concerned about maintaining her weight without the medication.  She takes over-the-counter vitamin D at a dose of 4000 IU daily.   She experienced a persistent cough around Christmas time, which has mostly resolved, though she occasionally experiences a mild cough. She attributes the cough to post-nasal drip and uses Xyzal for allergies.          PHYSICAL EXAM:  Blood pressure 100/66, pulse 82, temperature 98.1 F (36.7 C), height  5\' 3"  (1.6 m), weight 125 lb (56.7 kg), SpO2 99%. Body mass index is 22.14 kg/m.  DIAGNOSTIC DATA REVIEWED:  BMET    Component Value Date/Time   NA 137 11/15/2023 0022   NA 142 01/02/2023 0905   K 3.7 11/15/2023 0022   CL 106 11/15/2023 0022   CO2 23 11/15/2023 0022   GLUCOSE 99 11/15/2023 0022   BUN 16 11/15/2023 0022   BUN 10 01/02/2023 0905   CREATININE 0.99 11/15/2023 0022   CALCIUM 8.8 (L) 11/15/2023 0022   GFRNONAA >60 11/15/2023 0022   GFRAA 88 06/01/2020 1434   Lab Results  Component Value Date   HGBA1C 4.9 01/02/2023   HGBA1C 5.4 06/01/2020   Lab Results  Component Value Date   INSULIN 7.2 01/02/2023   INSULIN 10.8 06/01/2020   Lab Results  Component Value Date   TSH 1.070 06/01/2020   CBC    Component Value Date/Time   WBC 9.1 11/15/2023 0022   RBC 3.41 (L) 11/15/2023 0022   HGB 10.7 (L) 11/15/2023 0022   HGB 12.8 06/01/2020 1434   HCT 32.3 (L) 11/15/2023 0022   HCT 40.1 06/01/2020 1434   PLT 276 11/15/2023 0022   PLT 319 06/01/2020 1434   MCV 94.7 11/15/2023 0022   MCV 94 06/01/2020 1434   MCH 31.4 11/15/2023 0022   MCHC 33.1 11/15/2023 0022   RDW 13.6 11/15/2023 0022   RDW 12.8 06/01/2020 1434   Iron Studies    Component  Value Date/Time   IRON 95 06/01/2020 1434   TIBC 315 06/01/2020 1434   FERRITIN 228 (H) 06/01/2020 1434   IRONPCTSAT 30 06/01/2020 1434   Lipid Panel     Component Value Date/Time   CHOL 205 (H) 01/02/2023 0905   TRIG 70 01/02/2023 0905   HDL 75 01/02/2023 0905   CHOLHDL 3.3 02/07/2020 1003   CHOLHDL 3.2 10/23/2009 0730   VLDL 9 10/23/2009 0730   LDLCALC 117 (H) 01/02/2023 0905   Hepatic Function Panel     Component Value Date/Time   PROT 7.8 01/02/2023 0905   ALBUMIN 5.2 (H) 01/02/2023 0905   AST 16 01/02/2023 0905   ALT 8 01/02/2023 0905   ALKPHOS 101 01/02/2023 0905   BILITOT 0.4 01/02/2023 0905      Component Value Date/Time   TSH 1.070 06/01/2020 1434   Nutritional Lab Results  Component Value  Date   VD25OH 30.0 01/02/2023   VD25OH 39.6 06/01/2020     Assessment and Plan    Obesity  She is actively managing her weight, having lost three pounds in the last six weeks. She follows a 1200 calorie diet with a goal of 75 grams of protein daily and exercises 30 to 60 minutes five times per week. She has discontinued Zepbound for two to three weeks due to insurance issues and is concerned about potential weight gain. Emphasized the importance of not feeling deprived to prevent binge eating and suggested small indulgences like low-calorie chocolates. Discussed the role of exercise in maintaining muscle mass and improving metabolism. She expressed difficulty in finding new recipes and maintaining dietary variety, which could impact adherence to the diet plan. Suggested using online resources and AI tools to find new high-protein, low-calorie recipes. - Continue current diet and exercise regimen - Consider small indulgences to prevent feelings of deprivation - Follow up in six weeks to assess weight maintenance - Explore allrecipe.com and AI tools like ChatGPT for new recipes - Ensure protein intake meets daily goals   Polyphagia d/c zepbound as it is no longer covered, and she is doing well avoiding weight gain so far.   Allergic Rhinitis She has a persistent cough that has mostly resolved. With allergy season approaching, continuing Xyzal is recommended to manage symptoms and prevent post-nasal drip. - Continue Xyzal for allergy management   FU 4 weeks        She was informed of the importance of frequent follow up visits to maximize her success with intensive lifestyle modifications for her multiple health conditions.    Quillian Quince, MD

## 2024-03-16 ENCOUNTER — Telehealth (INDEPENDENT_AMBULATORY_CARE_PROVIDER_SITE_OTHER): Payer: Self-pay | Admitting: *Deleted

## 2024-03-16 NOTE — Telephone Encounter (Signed)
 PA SUBMITTED VIA [employer] FOR ZEPBOUND  Angela Mcconnell (Key: [id number])  Your demographic data has been sent to [employer] successfully!  [employer] typically takes up to one hour to respond, but it may take a little longer in some cases. You will be notified by email when available. You can also check for an update later by opening this request from your dashboard. Please do not fax or call [employer] to resubmit this request. If you need assistance, please chat with [employer] or call us  at [phone].  If it has been longer than 24 hours, please reach out to [employer].

## 2024-03-16 NOTE — Telephone Encounter (Signed)
 This request has received an approval. View the bottom of the request for an electronic copy of the approval letter.  Patient sent message via Mychart.

## 2024-03-16 NOTE — Telephone Encounter (Signed)
 SUBMITTED ADDITIONAL INFORMATION  Yumi Arnette (Key: [id number])  [employer] has not yet replied to your PA request. Depending on the information you've provided, additional questions may be returned by the plan. You may close this dialog, return to your dashboard, and perform other tasks.  To check for an update later, open this request again from your dashboard.  If [employer] has not replied to your request within 24 hours please contact [employer] at [phone].

## 2024-03-21 ENCOUNTER — Encounter (INDEPENDENT_AMBULATORY_CARE_PROVIDER_SITE_OTHER): Payer: Self-pay | Admitting: *Deleted

## 2024-03-22 ENCOUNTER — Telehealth (INDEPENDENT_AMBULATORY_CARE_PROVIDER_SITE_OTHER): Payer: Self-pay | Admitting: Family Medicine

## 2024-03-22 ENCOUNTER — Ambulatory Visit (INDEPENDENT_AMBULATORY_CARE_PROVIDER_SITE_OTHER): Admitting: Family Medicine

## 2024-03-22 NOTE — Telephone Encounter (Signed)
 04/21 pt upst about having to rs appt out until June, pt stated she was supposed to go over winging her off the zepbound  and would like a call to talk about options

## 2024-03-23 ENCOUNTER — Telehealth (INDEPENDENT_AMBULATORY_CARE_PROVIDER_SITE_OTHER): Admitting: Family Medicine

## 2024-03-23 ENCOUNTER — Encounter (INDEPENDENT_AMBULATORY_CARE_PROVIDER_SITE_OTHER): Payer: Self-pay | Admitting: *Deleted

## 2024-03-23 DIAGNOSIS — E7849 Other hyperlipidemia: Secondary | ICD-10-CM

## 2024-03-23 DIAGNOSIS — E669 Obesity, unspecified: Secondary | ICD-10-CM | POA: Diagnosis not present

## 2024-03-23 DIAGNOSIS — E559 Vitamin D deficiency, unspecified: Secondary | ICD-10-CM | POA: Diagnosis not present

## 2024-03-23 DIAGNOSIS — R632 Polyphagia: Secondary | ICD-10-CM | POA: Diagnosis not present

## 2024-03-23 MED ORDER — TIRZEPATIDE-WEIGHT MANAGEMENT 7.5 MG/0.5ML ~~LOC~~ SOLN: 7.5000 mg | SUBCUTANEOUS | 0 refills | Status: AC

## 2024-03-24 NOTE — Progress Notes (Signed)
 Office: [phone]  /  Fax: [phone]  WEIGHT SUMMARY AND BIOMETRICS  No data recorded No data recorded No data recorded  Virtual Visit via Telephone Note  I connected with Angela Mcconnell on 03/24/24 at  3:40 PM EDT by audiovisual telehealth and verified that I am speaking with the correct person using two identifiers.  Location: Patient: home Provider: home   I discussed the limitations, risks, security and privacy concerns of performing an evaluation and management service by AV telehealth and the availability of in person appointments. I also discussed with the patient that there may be a patient responsible charge related to this service. The patient expressed understanding and agreed to proceed.    Chief Complaint: OBESITY   History of Present Illness Angela Mcconnell "Angela Mcconnell" is a 63 year old female who presents for obesity treatment.  She has been following a diet of 1200 calories with at least 80 grams of protein per day, achieving this about 80% of the time except during vacations. Recently, she gained approximately five pounds during a vacation, which she attributes to increased sodium intake leading to fluid retention, as evidenced by swelling in her feet and ankles.  She has been off her medication, Zepbound , for approximately six weeks and reports an increase in hunger. Her insurance initially refused to cover Zepbound , but now indicates potential coverage, though the copay details are uncertain.  She engages in significant physical activity, performing 90 minutes of cardio exercise six times a week. She notes that this level of exercise increases her hunger, leading her to consume more calories than she burns.      PHYSICAL EXAM:  There were no vitals taken for this visit. There is no height or weight on file to calculate BMI.  DIAGNOSTIC DATA REVIEWED:  BMET    Component Value Date/Time   NA 137 11/15/2023 0022   NA 142 01/02/2023 0905    K 3.7 11/15/2023 0022   CL 106 11/15/2023 0022   CO2 23 11/15/2023 0022   GLUCOSE 99 11/15/2023 0022   BUN 16 11/15/2023 0022   BUN 10 01/02/2023 0905   CREATININE 0.99 11/15/2023 0022   CALCIUM 8.8 (L) 11/15/2023 0022   GFRNONAA >60 11/15/2023 0022   GFRAA 88 06/01/2020 1434   Lab Results  Component Value Date   HGBA1C 4.9 01/02/2023   HGBA1C 5.4 06/01/2020   Lab Results  Component Value Date   INSULIN  7.2 01/02/2023   INSULIN  10.8 06/01/2020   Lab Results  Component Value Date   TSH 1.070 06/01/2020   CBC    Component Value Date/Time   WBC 9.1 11/15/2023 0022   RBC 3.41 (L) 11/15/2023 0022   HGB 10.7 (L) 11/15/2023 0022   HGB 12.8 06/01/2020 1434   HCT 32.3 (L) 11/15/2023 0022   HCT 40.1 06/01/2020 1434   PLT 276 11/15/2023 0022   PLT 319 06/01/2020 1434   MCV 94.7 11/15/2023 0022   MCV 94 06/01/2020 1434   MCH 31.4 11/15/2023 0022   MCHC 33.1 11/15/2023 0022   RDW 13.6 11/15/2023 0022   RDW 12.8 06/01/2020 1434   Iron Studies    Component Value Date/Time   IRON 95 06/01/2020 1434   TIBC 315 06/01/2020 1434   FERRITIN 228 (H) 06/01/2020 1434   IRONPCTSAT 30 06/01/2020 1434   Lipid Panel     Component Value Date/Time   CHOL 205 (H) 01/02/2023 0905   TRIG 70 01/02/2023 0905   HDL 75 01/02/2023 0905  CHOLHDL 3.3 02/07/2020 1003   CHOLHDL 3.2 10/23/2009 0730   VLDL 9 10/23/2009 0730   LDLCALC 117 (H) 01/02/2023 0905   Hepatic Function Panel     Component Value Date/Time   PROT 7.8 01/02/2023 0905   ALBUMIN 5.2 (H) 01/02/2023 0905   AST 16 01/02/2023 0905   ALT 8 01/02/2023 0905   ALKPHOS 101 01/02/2023 0905   BILITOT 0.4 01/02/2023 0905      Component Value Date/Time   TSH 1.070 06/01/2020 1434   Nutritional Lab Results  Component Value Date   VD25OH 30.0 01/02/2023   VD25OH 39.6 06/01/2020     Assessment and Plan Assessment & Plan Obesity Obesity management with dietary and exercise modifications. She follows a 1200 calorie  diet with adequate protein intake, achieving 80% compliance. Recent weight gain of 5 pounds likely due to fluid retention from increased sodium intake during vacation. Off Zepbound  for 6 weeks, leading to increased hunger. Current exercise regimen includes 90 minutes of cardio 6 times a week, which may increase hunger signals. Recommended adjustment to 30 minutes of cardio and 30 minutes of strengthening exercises to improve metabolism and reduce polyphagia risk. - Prescribe Zepbound  7.5 mg every other week to manage obesity and reduce hunger. - Instruct her to contact [facility] to determine copay for Zepbound  before pickup. - Continue 1200 calorie diet with 80 or more grams of protein daily. - Adjust exercise regimen to 30 minutes of cardio most days and 30 minutes of strengthening exercises.  Polyphagia Polyphagia associated with increased hunger after discontinuation of Zepbound . Hunger signals exacerbated by current high-intensity cardio exercise regimen. Insurance now covers Zepbound , but copay details are uncertain. - Manage polyphagia with Zepbound  7.5 mg every other week. - Continue dietary management with 1200 calorie diet and adequate protein intake. - Modify exercise regimen to include 30 minutes of cardio and 30 minutes of strengthening exercises.    She was informed of the importance of frequent follow up visits to maximize her success with intensive lifestyle modifications for her multiple health conditions.    Jasmine Mesi, MD

## 2024-04-15 ENCOUNTER — Telehealth (INDEPENDENT_AMBULATORY_CARE_PROVIDER_SITE_OTHER): Payer: Self-pay | Admitting: Family Medicine

## 2024-04-15 NOTE — Telephone Encounter (Signed)
 Patient called in stating her insurance will not approve medication until she has a TSH, A1C Can you order these labs for her next visit on 05/05/24

## 2024-04-19 NOTE — Addendum Note (Signed)
 Addended by: Jasmine Mesi D on: 04/19/2024 05:17 PM   Modules accepted: Orders

## 2024-04-30 ENCOUNTER — Other Ambulatory Visit: Payer: Self-pay | Admitting: Physician Assistant

## 2024-04-30 DIAGNOSIS — Z1231 Encounter for screening mammogram for malignant neoplasm of breast: Secondary | ICD-10-CM

## 2024-04-30 LAB — CMP14+EGFR
ALT: 15 IU/L (ref 0–32)
AST: 18 IU/L (ref 0–40)
Albumin: 4.3 g/dL (ref 3.9–4.9)
Alkaline Phosphatase: 81 IU/L (ref 44–121)
BUN/Creatinine Ratio: 17 (ref 12–28)
BUN: 14 mg/dL (ref 8–27)
Bilirubin Total: 0.3 mg/dL (ref 0.0–1.2)
CO2: 21 mmol/L (ref 20–29)
Calcium: 9.4 mg/dL (ref 8.7–10.3)
Chloride: 105 mmol/L (ref 96–106)
Creatinine, Ser: 0.83 mg/dL (ref 0.57–1.00)
Globulin, Total: 2.4 g/dL (ref 1.5–4.5)
Glucose: 73 mg/dL (ref 70–99)
Potassium: 4.2 mmol/L (ref 3.5–5.2)
Sodium: 142 mmol/L (ref 134–144)
Total Protein: 6.7 g/dL (ref 6.0–8.5)
eGFR: 80 mL/min/{1.73_m2} (ref >59)

## 2024-04-30 LAB — LIPID PANEL WITH LDL/HDL RATIO
Cholesterol, Total: 204 mg/dL — ABNORMAL HIGH (ref 100–199)
HDL: 78 mg/dL (ref >39)
LDL Chol Calc (NIH): 113 mg/dL — ABNORMAL HIGH (ref 0–99)
LDL/HDL Ratio: 1.4 ratio (ref 0.0–3.2)
Triglycerides: 70 mg/dL (ref 0–149)
VLDL Cholesterol Cal: 13 mg/dL (ref 5–40)

## 2024-04-30 LAB — HEMOGLOBIN A1C
Est. average glucose Bld gHb Est-mCnc: 91 mg/dL
Hgb A1c MFr Bld: 4.8 % (ref 4.8–5.6)

## 2024-04-30 LAB — VITAMIN B12: Vitamin B-12: 616 pg/mL (ref 232–1245)

## 2024-04-30 LAB — INSULIN, RANDOM: INSULIN: 8.3 u[IU]/mL (ref 2.6–24.9)

## 2024-04-30 LAB — VITAMIN D 25 HYDROXY (VIT D DEFICIENCY, FRACTURES): Vit D, 25-Hydroxy: 27.5 ng/mL — ABNORMAL LOW (ref 30.0–100.0)

## 2024-04-30 LAB — TSH: TSH: 2.45 u[IU]/mL (ref 0.450–4.500)

## 2024-05-05 ENCOUNTER — Other Ambulatory Visit (INDEPENDENT_AMBULATORY_CARE_PROVIDER_SITE_OTHER): Payer: Self-pay | Admitting: Family Medicine

## 2024-05-05 ENCOUNTER — Encounter (INDEPENDENT_AMBULATORY_CARE_PROVIDER_SITE_OTHER): Payer: Self-pay | Admitting: Family Medicine

## 2024-05-05 ENCOUNTER — Ambulatory Visit (INDEPENDENT_AMBULATORY_CARE_PROVIDER_SITE_OTHER): Admitting: Family Medicine

## 2024-05-05 VITALS — BP 147/89 | HR 81 | Temp 97.9°F | Ht 63.0 in | Wt 124.0 lb

## 2024-05-05 DIAGNOSIS — Z6821 Body mass index (BMI) 21.0-21.9, adult: Secondary | ICD-10-CM

## 2024-05-05 DIAGNOSIS — E66811 Obesity, class 1: Secondary | ICD-10-CM

## 2024-05-05 DIAGNOSIS — Z6832 Body mass index (BMI) 32.0-32.9, adult: Secondary | ICD-10-CM

## 2024-05-05 DIAGNOSIS — R030 Elevated blood-pressure reading, without diagnosis of hypertension: Secondary | ICD-10-CM | POA: Diagnosis not present

## 2024-05-05 DIAGNOSIS — S92909A Unspecified fracture of unspecified foot, initial encounter for closed fracture: Secondary | ICD-10-CM

## 2024-05-05 DIAGNOSIS — R632 Polyphagia: Secondary | ICD-10-CM

## 2024-05-05 DIAGNOSIS — M6284 Sarcopenia: Secondary | ICD-10-CM | POA: Diagnosis not present

## 2024-05-05 DIAGNOSIS — E669 Obesity, unspecified: Secondary | ICD-10-CM

## 2024-05-05 DIAGNOSIS — Z6822 Body mass index (BMI) 22.0-22.9, adult: Secondary | ICD-10-CM

## 2024-05-05 MED ORDER — TIRZEPATIDE-WEIGHT MANAGEMENT 7.5 MG/0.5ML ~~LOC~~ SOLN
7.5000 mg | SUBCUTANEOUS | 0 refills | Status: DC
Start: 2024-05-05 — End: 2024-09-22

## 2024-05-05 NOTE — Progress Notes (Signed)
 Office: [phone]  /  Fax: [phone]  WEIGHT SUMMARY AND BIOMETRICS  Anthropometric Measurements Height: 5\' 3"  (1.6 m) Weight: 124 lb (56.2 kg) BMI (Calculated): 21.97 Weight at Last Visit: 125 lb Weight Lost Since Last Visit: 1 lb Weight Gained Since Last Visit: 0 Starting Weight: 186 lb Total Weight Loss (lbs): 62 lb (28.1 kg) Peak Weight: 198 lb   Body Composition  Body Fat %: 32.6 % Fat Mass (lbs): 40.6 lbs Muscle Mass (lbs): 79.8 lbs Total Body Water (lbs): 59.4 lbs Visceral Fat Rating : 7   Other Clinical Data Fasting: no Labs: no Today's Visit #: 34 Starting Date: 06/01/20    Chief Complaint: OBESITY    History of Present Illness Angela Mcconnell "Angela Mcconnell" is a 63 year old female who presents with concerns about obesity management and elevated blood pressure.  She has been following a category two plan and maintaining a 1200 calorie diet for the past three months. Despite walking 30 to 60 minutes five times a week, she has only lost one pound. She is concerned about her protein intake and is incorporating more protein into her diet through smoothies and other sources. She is also trying to maintain her physical activity despite a foot injury.  She experienced a foot injury when a cast iron skillet fell on her foot, leading to a suspected small fracture in the middle of her foot as per urgent care. She is currently wearing a boot and awaiting an appointment with an orthopedic doctor.  Her blood pressure was recorded at 153/81 and 147/89 during the visit, while it was 127/87 at urgent care the previous night. She has access to a blood pressure cuff at home and is monitoring her blood pressure regularly.  She has been taking Zepbound , initially at a dose of 7.5 mg, which she self-funded due to insurance issues. She experienced a bruise and leg pain after her first injection, which resolved by the next morning. She is concerned about her cholesterol levels  and is taking oil of oregano, black seed oil, and thymoquinone to manage it without medication.  She is experiencing a decrease in muscle mass and acknowledges not consuming enough protein, estimating her intake at half to a third of the recommended amount. She is trying to increase her protein intake through dietary changes, including growing her own vegetables and using protein supplements.      PHYSICAL EXAM:  Blood pressure (!) 147/89, pulse 81, temperature 97.9 F (36.6 C), height 5\' 3"  (1.6 m), weight 124 lb (56.2 kg), SpO2 100%. Body mass index is 21.97 kg/m.  DIAGNOSTIC DATA REVIEWED:  BMET    Component Value Date/Time   NA 142 04/29/2024 0912   K 4.2 04/29/2024 0912   CL 105 04/29/2024 0912   CO2 21 04/29/2024 0912   GLUCOSE 73 04/29/2024 0912   GLUCOSE 99 11/15/2023 0022   BUN 14 04/29/2024 0912   CREATININE 0.83 04/29/2024 0912   CALCIUM 9.4 04/29/2024 0912   GFRNONAA >60 11/15/2023 0022   GFRAA 88 06/01/2020 1434   Lab Results  Component Value Date   HGBA1C 4.8 04/29/2024   HGBA1C 5.4 06/01/2020   Lab Results  Component Value Date   INSULIN  8.3 04/29/2024   INSULIN  10.8 06/01/2020   Lab Results  Component Value Date   TSH 2.450 04/29/2024   CBC    Component Value Date/Time   WBC 9.1 11/15/2023 0022   RBC 3.41 (L) 11/15/2023 0022   HGB 10.7 (L) 11/15/2023 0022  HGB 12.8 06/01/2020 1434   HCT 32.3 (L) 11/15/2023 0022   HCT 40.1 06/01/2020 1434   PLT 276 11/15/2023 0022   PLT 319 06/01/2020 1434   MCV 94.7 11/15/2023 0022   MCV 94 06/01/2020 1434   MCH 31.4 11/15/2023 0022   MCHC 33.1 11/15/2023 0022   RDW 13.6 11/15/2023 0022   RDW 12.8 06/01/2020 1434   Iron Studies    Component Value Date/Time   IRON 95 06/01/2020 1434   TIBC 315 06/01/2020 1434   FERRITIN 228 (H) 06/01/2020 1434   IRONPCTSAT 30 06/01/2020 1434   Lipid Panel     Component Value Date/Time   CHOL 204 (H) 04/29/2024 0912   TRIG 70 04/29/2024 0912   HDL 78 04/29/2024  0912   CHOLHDL 3.3 02/07/2020 1003   CHOLHDL 3.2 10/23/2009 0730   VLDL 9 10/23/2009 0730   LDLCALC 113 (H) 04/29/2024 0912   Hepatic Function Panel     Component Value Date/Time   PROT 6.7 04/29/2024 0912   ALBUMIN 4.3 04/29/2024 0912   AST 18 04/29/2024 0912   ALT 15 04/29/2024 0912   ALKPHOS 81 04/29/2024 0912   BILITOT 0.3 04/29/2024 0912      Component Value Date/Time   TSH 2.450 04/29/2024 0912   Nutritional Lab Results  Component Value Date   VD25OH 27.5 (L) 04/29/2024   VD25OH 30.0 01/02/2023   VD25OH 39.6 06/01/2020     Assessment and Plan Assessment & Plan Foot Fracture Sustained a foot injury from a cast iron skillet, resulting in a suspected small fracture in the midfoot. Currently wearing a boot and has not yet seen an orthopedic specialist. - Refer to orthopedic specialist for evaluation of foot fracture. -Delay exercise until a treatment plan is in place  Obesity Following a category two plan with a caloric intake of 1200 calories daily and walking 30-60 minutes five times a week. Despite these efforts, she has only lost one pound. BMI is now 21, indicating resolution of obesity, but there is concern about muscle mass loss due to sarcopenia from insufficient protein intake, consuming only half to a third of the recommended amount, which can lead to potential kidney issues. - Continue current diet and exercise regimen. - Monitor weight and muscle mass. - Increase protein intake to at least 75 grams per day. - Incorporate strengthening exercises once foot is evaluated and cleared by ortho. - Discussed protein-rich food options and recipes.  Polyphagia Being treated with Zepbound  7.5 mg. Experienced transient leg pain after the last injection, which resolved by the next morning. Emphasized the importance of not skipping meals to prevent malnutrition. Using [employer] to pay for Zepbound  due to insurance issues. - Refill Zepbound  7.5 mg. - Ensure adequate meal  intake to prevent malnutrition.  Sarcopenia Protein intake is inadequate and muscle mass is decreasing -Must increase protein intake to at least 75 gm or more per day - follow up in 4-6 weeks to assess prgress  Elevated Blood Pressure Blood pressure readings were elevated at 153/81 and 147/89 during the visit, with a lower reading of 127/87 at urgent care. No history of hypertension. Elevation may be related to dietary sodium intake from a recent meal. Advised to monitor blood pressure at home to ensure this is not a trend. - Monitor blood pressure at home 2-3 times a week. - Record blood pressure readings for follow-up. - Increase hydration to manage sodium intake.  General Health Maintenance Actively maintaining a healthy weight and improving overall health  through diet and exercise. Taking supplements to manage cholesterol levels without medication due to concerns about side effects. - Continue current lifestyle modifications. - Monitor cholesterol levels with future lab tests.    She was informed of the importance of frequent follow up visits to maximize her success with intensive lifestyle modifications for her multiple health conditions.    Jasmine Mesi, MD

## 2024-05-06 ENCOUNTER — Other Ambulatory Visit (INDEPENDENT_AMBULATORY_CARE_PROVIDER_SITE_OTHER): Payer: Self-pay | Admitting: Family Medicine

## 2024-05-06 DIAGNOSIS — R632 Polyphagia: Secondary | ICD-10-CM

## 2024-05-06 DIAGNOSIS — Z6832 Body mass index (BMI) 32.0-32.9, adult: Secondary | ICD-10-CM

## 2024-05-19 ENCOUNTER — Ambulatory Visit
Admission: RE | Admit: 2024-05-19 | Discharge: 2024-05-19 | Disposition: A | Discharge status: Home / Self Care | Source: Ambulatory Visit | Attending: Physician Assistant | Admitting: Physician Assistant

## 2024-05-19 DIAGNOSIS — Z1231 Encounter for screening mammogram for malignant neoplasm of breast: Secondary | ICD-10-CM

## 2024-05-21 ENCOUNTER — Other Ambulatory Visit: Payer: Self-pay | Admitting: Physician Assistant

## 2024-05-21 DIAGNOSIS — R928 Other abnormal and inconclusive findings on diagnostic imaging of breast: Secondary | ICD-10-CM

## 2024-05-28 ENCOUNTER — Ambulatory Visit

## 2024-05-28 ENCOUNTER — Ambulatory Visit
Admission: RE | Admit: 2024-05-28 | Discharge: 2024-05-28 | Disposition: A | Discharge status: Home / Self Care | Source: Ambulatory Visit | Attending: Physician Assistant | Admitting: Physician Assistant

## 2024-05-28 DIAGNOSIS — R928 Other abnormal and inconclusive findings on diagnostic imaging of breast: Secondary | ICD-10-CM

## 2024-07-01 ENCOUNTER — Ambulatory Visit (INDEPENDENT_AMBULATORY_CARE_PROVIDER_SITE_OTHER): Admitting: Family Medicine

## 2024-07-08 ENCOUNTER — Ambulatory Visit (INDEPENDENT_AMBULATORY_CARE_PROVIDER_SITE_OTHER): Admitting: Family Medicine

## 2024-07-27 ENCOUNTER — Ambulatory Visit (INDEPENDENT_AMBULATORY_CARE_PROVIDER_SITE_OTHER): Admitting: Family Medicine

## 2024-07-27 ENCOUNTER — Encounter (INDEPENDENT_AMBULATORY_CARE_PROVIDER_SITE_OTHER): Payer: Self-pay | Admitting: Family Medicine

## 2024-07-27 VITALS — BP 130/84 | HR 97 | Temp 97.9°F | Ht 63.0 in | Wt 131.0 lb

## 2024-07-27 DIAGNOSIS — E669 Obesity, unspecified: Secondary | ICD-10-CM

## 2024-07-27 DIAGNOSIS — F5089 Other specified eating disorder: Secondary | ICD-10-CM | POA: Diagnosis not present

## 2024-07-27 DIAGNOSIS — Z6823 Body mass index (BMI) 23.0-23.9, adult: Secondary | ICD-10-CM | POA: Diagnosis not present

## 2024-07-27 DIAGNOSIS — Z8639 Personal history of other endocrine, nutritional and metabolic disease: Secondary | ICD-10-CM

## 2024-07-27 DIAGNOSIS — E66811 Obesity, class 1: Secondary | ICD-10-CM

## 2024-07-27 DIAGNOSIS — F3289 Other specified depressive episodes: Secondary | ICD-10-CM

## 2024-07-27 MED ORDER — TOPIRAMATE 50 MG PO TABS: 50.0000 mg | ORAL_TABLET | Freq: Every day | ORAL | 1 refills | Status: DC

## 2024-07-27 NOTE — Progress Notes (Signed)
 Office: [phone]  /  Fax: [phone]  WEIGHT SUMMARY AND BIOMETRICS  Anthropometric Measurements Height: 5' 3 (1.6 m) Weight: 131 lb (59.4 kg) BMI (Calculated): 23.21 Weight at Last Visit: 124 lb Weight Lost Since Last Visit: 0 Weight Gained Since Last Visit: 7 lb Starting Weight: 186 lb Total Weight Loss (lbs): 55 lb (24.9 kg) Peak Weight: 193 lb   Body Composition  Body Fat %: 33.7 % Fat Mass (lbs): 44.2 lbs Muscle Mass (lbs): 82.6 lbs Total Body Water (lbs): 61 lbs Visceral Fat Rating : 7   Other Clinical Data Fasting: no Labs: no Today's Visit #: 35 Starting Date: 06/01/20    Chief Complaint: OBESITY    History of Present Illness Angela Mcconnell is a 63 year old female who presents for a review of her obesity treatment.  She has been on Zepbound , currently at a dose of 7.5 mg, and has experienced significant weight loss. Her BMI had previously dropped to 22. However, she has gained 7 pounds over the last two and a half months, bringing her BMI to 23.  Of the weight gained, 3 pounds are muscle mass. She had previously been losing muscle due to calorie and protein malnutrition. She has also gained a small amount of fat and water, but her visceral fat remains at goal at 7. She is consuming approximately 1200 calories daily, focusing on increasing protein and vegetable intake. While she is doing well with vegetables, she struggles with protein intake. She exercises five days a week, walking for 30 to 60 minutes each session.  Her insurance will no longer cover Zepbound . She prefers medication that reduces cravings rather than physical hunger. No clear history of kidney stones or seizure disorders. She sometimes experiences migraines.  Her current medication list is not extensive, and she has been trying to increase her protein intake, having a protein shake for lunch and yogurt for breakfast. She is pleased with the muscle gain and has been  actively working on her nutrition and exercise regimen.      PHYSICAL EXAM:  Blood pressure 130/84, pulse 97, temperature 97.9 F (36.6 C), height 5' 3 (1.6 m), weight 131 lb (59.4 kg), SpO2 99%. Body mass index is 23.21 kg/m.  DIAGNOSTIC DATA REVIEWED:  BMET    Component Value Date/Time   NA 142 04/29/2024 0912   K 4.2 04/29/2024 0912   CL 105 04/29/2024 0912   CO2 21 04/29/2024 0912   GLUCOSE 73 04/29/2024 0912   GLUCOSE 99 11/15/2023 0022   BUN 14 04/29/2024 0912   CREATININE 0.83 04/29/2024 0912   CALCIUM 9.4 04/29/2024 0912   GFRNONAA >60 11/15/2023 0022   GFRAA 88 06/01/2020 1434   Lab Results  Component Value Date   HGBA1C 4.8 04/29/2024   HGBA1C 5.4 06/01/2020   Lab Results  Component Value Date   INSULIN  8.3 04/29/2024   INSULIN  10.8 06/01/2020   Lab Results  Component Value Date   TSH 2.450 04/29/2024   CBC    Component Value Date/Time   WBC 9.1 11/15/2023 0022   RBC 3.41 (L) 11/15/2023 0022   HGB 10.7 (L) 11/15/2023 0022   HGB 12.8 06/01/2020 1434   HCT 32.3 (L) 11/15/2023 0022   HCT 40.1 06/01/2020 1434   PLT 276 11/15/2023 0022   PLT 319 06/01/2020 1434   MCV 94.7 11/15/2023 0022   MCV 94 06/01/2020 1434   MCH 31.4 11/15/2023 0022   MCHC 33.1 11/15/2023 0022   RDW 13.6 11/15/2023  0022   RDW 12.8 06/01/2020 1434   Iron Studies    Component Value Date/Time   IRON 95 06/01/2020 1434   TIBC 315 06/01/2020 1434   FERRITIN 228 (H) 06/01/2020 1434   IRONPCTSAT 30 06/01/2020 1434   Lipid Panel     Component Value Date/Time   CHOL 204 (H) 04/29/2024 0912   TRIG 70 04/29/2024 0912   HDL 78 04/29/2024 0912   CHOLHDL 3.3 02/07/2020 1003   CHOLHDL 3.2 10/23/2009 0730   VLDL 9 10/23/2009 0730   LDLCALC 113 (H) 04/29/2024 0912   Hepatic Function Panel     Component Value Date/Time   PROT 6.7 04/29/2024 0912   ALBUMIN 4.3 04/29/2024 0912   AST 18 04/29/2024 0912   ALT 15 04/29/2024 0912   ALKPHOS 81 04/29/2024 0912   BILITOT 0.3  04/29/2024 0912      Component Value Date/Time   TSH 2.450 04/29/2024 0912   Nutritional Lab Results  Component Value Date   VD25OH 27.5 (L) 04/29/2024   VD25OH 30.0 01/02/2023   VD25OH 39.6 06/01/2020     Assessment and Plan Assessment & Plan Obesity with history of calorie and protein malnutrition She has experienced significant weight loss, with her BMI previously dropping to 22. She has since gained 7 pounds over the last two and a half months, with a current BMI of 23. Of the weight gained, 3 pounds were muscle, which is beneficial given her previous muscle mass loss due to calorie and protein malnutrition. Her visceral fat remains at goal. She is consuming approximately 1200 calories daily, focusing on increasing protein and vegetables, though she struggles with protein intake. She exercises five days a week, walking 30 to 60 minutes each session. Her insurance no longer covers Zepbound , necessitating a change in her treatment plan. - Discontinue Zepbound  due to insurance coverage issues. - Encourage continued focus on protein intake and vegetable consumption. - Maintain current exercise regimen of walking 30 to 60 minutes, five days a week.  Emotional eating behaviors She reports issues with cravings rather than physical hunger. Her medical history for kidney stones and seizure disorders is negative. Topiramate  targets emotional eating behaviors and binge eating, with low doses providing effective weight loss and minimal side effects. Common side effects at low doses include taste disturbances, particularly with carbonated beverages, and rare occurrences of tingling in fingertips. The medication is contraindicated in pregnancy due to risk of birth defects. She is post menopausal. - Prescribe topiramate , starting with 25 mg daily for the first week, then increase to 50 mg daily if well-tolerated and needed. - Monitor for side effects such as taste disturbances and tingling in  fingertips. - Ensure medication is covered under the diagnosis of emotional eating behaviors.  Follow-Up She agrees to a follow-up timeline that aligns with her schedule and upcoming events. - Schedule follow-up appointment in 8 to 10 weeks, before the holiday season.      She was informed of the importance of frequent follow up visits to maximize her success with intensive lifestyle modifications for her multiple health conditions.    Louann Penton, MD

## 2024-09-19 ENCOUNTER — Other Ambulatory Visit (INDEPENDENT_AMBULATORY_CARE_PROVIDER_SITE_OTHER): Payer: Self-pay | Admitting: Family Medicine

## 2024-09-22 ENCOUNTER — Ambulatory Visit (INDEPENDENT_AMBULATORY_CARE_PROVIDER_SITE_OTHER): Admitting: Family Medicine

## 2024-09-22 ENCOUNTER — Encounter (INDEPENDENT_AMBULATORY_CARE_PROVIDER_SITE_OTHER): Payer: Self-pay | Admitting: Family Medicine

## 2024-09-22 VITALS — BP 105/69 | HR 83 | Temp 97.6°F | Ht 63.0 in | Wt 129.0 lb

## 2024-09-22 DIAGNOSIS — Z6832 Body mass index (BMI) 32.0-32.9, adult: Secondary | ICD-10-CM

## 2024-09-22 DIAGNOSIS — E559 Vitamin D deficiency, unspecified: Secondary | ICD-10-CM

## 2024-09-22 DIAGNOSIS — Z6822 Body mass index (BMI) 22.0-22.9, adult: Secondary | ICD-10-CM

## 2024-09-22 DIAGNOSIS — M6284 Sarcopenia: Secondary | ICD-10-CM

## 2024-09-22 MED ORDER — TOPIRAMATE 50 MG PO TABS: 50.0000 mg | ORAL_TABLET | Freq: Every day | ORAL | 1 refills | Status: DC

## 2024-09-22 NOTE — Addendum Note (Signed)
 Addended by: LAFE BAKER CROME on: 09/22/2024 10:24 AM   Modules accepted: Level of Service

## 2024-09-22 NOTE — Progress Notes (Signed)
 Office: [phone]  /  Fax: [phone]  WEIGHT SUMMARY AND BIOMETRICS  Anthropometric Measurements Height: 5' 3 (1.6 m) Weight: 129 lb (58.5 kg) BMI (Calculated): 22.86 Weight at Last Visit: 131 lb Weight Lost Since Last Visit: 2 lb Weight Gained Since Last Visit: 0 Starting Weight: 186 lb Total Weight Loss (lbs): 57 lb (25.9 kg) Peak Weight: 193 lb   Body Composition  Body Fat %: 32.7 % Fat Mass (lbs): 82.2 lbs Muscle Mass (lbs): 82.6 lbs Total Body Water (lbs): 58.4 lbs Visceral Fat Rating : 7   Other Clinical Data Fasting: yes Labs: no Today's Visit #: 36 Starting Date: 06/01/20    Chief Complaint: OBESITY    History of Present Illness Angela Mcconnell is a 63 year old female with obesity who presents for a follow-up on her obesity treatment plan and progress assessment.  She adheres to a 1200 calorie diet plan and exercises most days of the week, using a treadmill or Peloton bike for 60 minutes. Over the last two months, she has lost two pounds, and her BMI is now 22.9.  She experiences improved hunger management and is trying to manage her protein intake, exploring vegetarian options due to being 'sick of meat'. Her diet includes chicken, fish, mushrooms, yogurt, nuts, tofu, and veggie burgers. She enjoys a protein shake from [employer] and uses yogurt and peanut butter as protein sources.  She has not been on Zepbound  due to insurance issues but has been taking topiramate . She monitors her weight and fat percentage, noting fluctuations attributed to water retention. She maintains hydration with a [employer] full of ice and water.  She has a history of vitamin D  deficiency and takes an over-the-counter vitamin D  supplement of 4000 IU per day. Her last vitamin D  level was checked in May and was low at 27.5. She is due for lab work in seven to eight weeks.  No lightheadedness or dizziness.      PHYSICAL EXAM:  Blood pressure 105/69,  pulse 83, temperature 97.6 F (36.4 C), height 5' 3 (1.6 m), weight 129 lb (58.5 kg), SpO2 100%. Body mass index is 22.85 kg/m.  DIAGNOSTIC DATA REVIEWED:  BMET    Component Value Date/Time   NA 142 04/29/2024 0912   K 4.2 04/29/2024 0912   CL 105 04/29/2024 0912   CO2 21 04/29/2024 0912   GLUCOSE 73 04/29/2024 0912   GLUCOSE 99 11/15/2023 0022   BUN 14 04/29/2024 0912   CREATININE 0.83 04/29/2024 0912   CALCIUM 9.4 04/29/2024 0912   GFRNONAA >60 11/15/2023 0022   GFRAA 88 06/01/2020 1434   Lab Results  Component Value Date   HGBA1C 4.8 04/29/2024   HGBA1C 5.4 06/01/2020   Lab Results  Component Value Date   INSULIN  8.3 04/29/2024   INSULIN  10.8 06/01/2020   Lab Results  Component Value Date   TSH 2.450 04/29/2024   CBC    Component Value Date/Time   WBC 9.1 11/15/2023 0022   RBC 3.41 (L) 11/15/2023 0022   HGB 10.7 (L) 11/15/2023 0022   HGB 12.8 06/01/2020 1434   HCT 32.3 (L) 11/15/2023 0022   HCT 40.1 06/01/2020 1434   PLT 276 11/15/2023 0022   PLT 319 06/01/2020 1434   MCV 94.7 11/15/2023 0022   MCV 94 06/01/2020 1434   MCH 31.4 11/15/2023 0022   MCHC 33.1 11/15/2023 0022   RDW 13.6 11/15/2023 0022   RDW 12.8 06/01/2020 1434   Iron Studies  Component Value Date/Time   IRON 95 06/01/2020 1434   TIBC 315 06/01/2020 1434   FERRITIN 228 (H) 06/01/2020 1434   IRONPCTSAT 30 06/01/2020 1434   Lipid Panel     Component Value Date/Time   CHOL 204 (H) 04/29/2024 0912   TRIG 70 04/29/2024 0912   HDL 78 04/29/2024 0912   CHOLHDL 3.3 02/07/2020 1003   CHOLHDL 3.2 10/23/2009 0730   VLDL 9 10/23/2009 0730   LDLCALC 113 (H) 04/29/2024 0912   Hepatic Function Panel     Component Value Date/Time   PROT 6.7 04/29/2024 0912   ALBUMIN 4.3 04/29/2024 0912   AST 18 04/29/2024 0912   ALT 15 04/29/2024 0912   ALKPHOS 81 04/29/2024 0912   BILITOT 0.3 04/29/2024 0912      Component Value Date/Time   TSH 2.450 04/29/2024 0912   Nutritional Lab Results   Component Value Date   VD25OH 27.5 (L) 04/29/2024   VD25OH 30.0 01/02/2023   VD25OH 39.6 06/01/2020     Assessment and Plan Assessment & Plan Obesity, resolved with risk of malnutrition due to low BMI Obesity has resolved with a current BMI of 22.9, indicating a risk of malnutrition. She has lost two pounds in the last two months and maintained muscle mass. She is on topiramate . She is exercising regularly and maintaining a caloric intake of 1200 calories per day, with a focus on protein intake. She is tired of eating meat and fish and would like a way  to meet her nutritional goals without these. - Continue topiramate . - Provide a vegetarian eating plan to meet protein and caloric needs. - Advise maintaining caloric intake between 1200-1500 calories per day to maintain her weight. - Advise 80 or more grams of protein daily. - Encourage continued exercise, aiming for 60 minutes most days of the week. - Monitor weight and BMI to prevent malnutrition.  Vitamin D  deficiency Vitamin D  deficiency was noted with a level of 27.5 in May. She is on an over-the-counter vitamin D  supplement of 4000 IU per day. - Continue over-the-counter vitamin D  supplementation of 4000 IU per day. - Check vitamin D  levels in 7-8 weeks at the follow-up visit.  Sarcopenia Sarcopenia is improved as muscle mass has been maintained over the last two months despite weight loss. Regular exercise is contributing to muscle maintenance. - Encourage continued regular exercise to maintain muscle mass. - Encourage at least 80 or more grams of protein daily to decrease muscle loss.   Adonis was informed of the importance of frequent follow up visits to maximize her success with intensive lifestyle modifications for her obesity and obesity related health conditions as recommended by USPSTF and CMS guidelines   Louann Penton, MD

## 2024-10-23 ENCOUNTER — Other Ambulatory Visit (INDEPENDENT_AMBULATORY_CARE_PROVIDER_SITE_OTHER): Payer: Self-pay | Admitting: Family Medicine

## 2024-11-09 ENCOUNTER — Encounter (INDEPENDENT_AMBULATORY_CARE_PROVIDER_SITE_OTHER): Payer: Self-pay | Admitting: Family Medicine

## 2024-11-09 ENCOUNTER — Ambulatory Visit (INDEPENDENT_AMBULATORY_CARE_PROVIDER_SITE_OTHER): Payer: Self-pay | Admitting: Family Medicine

## 2024-11-09 VITALS — BP 110/72 | HR 73 | Temp 98.0°F | Ht 63.0 in | Wt 126.0 lb

## 2024-11-09 DIAGNOSIS — Z6822 Body mass index (BMI) 22.0-22.9, adult: Secondary | ICD-10-CM

## 2024-11-09 DIAGNOSIS — F3289 Other specified depressive episodes: Secondary | ICD-10-CM

## 2024-11-09 DIAGNOSIS — M6284 Sarcopenia: Secondary | ICD-10-CM

## 2024-11-09 DIAGNOSIS — E669 Obesity, unspecified: Secondary | ICD-10-CM

## 2024-11-09 MED ORDER — TOPIRAMATE 50 MG PO TABS: 50.0000 mg | ORAL_TABLET | Freq: Every day | ORAL | 0 refills | Status: DC

## 2024-11-09 NOTE — Progress Notes (Signed)
 Office: [phone]  /  Fax: [phone]  WEIGHT SUMMARY AND BIOMETRICS  Anthropometric Measurements Height: 5' 3 (1.6 m) Weight: 126 lb (57.2 kg) BMI (Calculated): 22.33 Weight at Last Visit: 129 lb Weight Lost Since Last Visit: 3 lb Weight Gained Since Last Visit: 0 Starting Weight: 186 lb Total Weight Loss (lbs): 60 lb (27.2 kg) Peak Weight: 193 lb   Body Composition  Body Fat %: 32.1 % Fat Mass (lbs): 40.6 lbs Muscle Mass (lbs): 81.4 lbs Total Body Water (lbs): 55.8 lbs Visceral Fat Rating : 7   Other Clinical Data Fasting: no Labs: no Today's Visit #: 37 Starting Date: 06/01/20    Chief Complaint: OBESITY    History of Present Illness Angela Mcconnell is a 63 year old female with obesity and sarcopenia who presents for obesity treatment and progress assessment.  She is following her prescribed journaling plan with a caloric intake of 1200 to 1500 calories and is attempting to meet a protein intake goal of 85 or more grams daily, though she is not certain she is consistently meeting the protein target. She exercises for 30 minutes, five times a week, using a treadmill and a Peloton bike. Since her last visit, she has lost three pounds, although her BMI is now 22.4, and she is supposed to be maintaining her weight.  She has a history of sarcopenia with significant muscle mass loss. She is uncertain about her protein goals. At her last visit, she maintained her muscle mass, but this month she has had  further muscle loss. Her visceral fat has remained stable at a level of seven.  In addition to obesity, she is being treated for emotional eating. She is currently taking topiramate  50 mg daily and requests a refill. She reports no issues with the medication and tolerates it well.  She will be starting the Angela Mcconnell fast with her Angela Mcconnell on a few weeks.  During the review of symptoms, she shared an incident from Thanksgiving where she ate a large piece of  carrot cake and ham, which made her feel sick shortly after. She experienced stomach noises and a headache, indicating a negative reaction to the meal.  She plans to spend the holidays with her family, including her grandchildren.      PHYSICAL EXAM:  Blood pressure 110/72, pulse 73, temperature 98 F (36.7 C), height 5' 3 (1.6 m), weight 126 lb (57.2 kg), SpO2 100%. Body mass index is 22.32 kg/m.  DIAGNOSTIC DATA REVIEWED:  BMET    Component Value Date/Time   NA 142 04/29/2024 0912   K 4.2 04/29/2024 0912   CL 105 04/29/2024 0912   CO2 21 04/29/2024 0912   GLUCOSE 73 04/29/2024 0912   GLUCOSE 99 11/15/2023 0022   BUN 14 04/29/2024 0912   CREATININE 0.83 04/29/2024 0912   CALCIUM 9.4 04/29/2024 0912   GFRNONAA >60 11/15/2023 0022   GFRAA 88 06/01/2020 1434   Lab Results  Component Value Date   HGBA1C 4.8 04/29/2024   HGBA1C 5.4 06/01/2020   Lab Results  Component Value Date   INSULIN  8.3 04/29/2024   INSULIN  10.8 06/01/2020   Lab Results  Component Value Date   TSH 2.450 04/29/2024   CBC    Component Value Date/Time   WBC 9.1 11/15/2023 0022   RBC 3.41 (L) 11/15/2023 0022   HGB 10.7 (L) 11/15/2023 0022   HGB 12.8 06/01/2020 1434   HCT 32.3 (L) 11/15/2023 0022   HCT 40.1 06/01/2020 1434  PLT 276 11/15/2023 0022   PLT 319 06/01/2020 1434   MCV 94.7 11/15/2023 0022   MCV 94 06/01/2020 1434   MCH 31.4 11/15/2023 0022   MCHC 33.1 11/15/2023 0022   RDW 13.6 11/15/2023 0022   RDW 12.8 06/01/2020 1434   Iron Studies    Component Value Date/Time   IRON 95 06/01/2020 1434   TIBC 315 06/01/2020 1434   FERRITIN 228 (H) 06/01/2020 1434   IRONPCTSAT 30 06/01/2020 1434   Lipid Panel     Component Value Date/Time   CHOL 204 (H) 04/29/2024 0912   TRIG 70 04/29/2024 0912   HDL 78 04/29/2024 0912   CHOLHDL 3.3 02/07/2020 1003   CHOLHDL 3.2 10/23/2009 0730   VLDL 9 10/23/2009 0730   LDLCALC 113 (H) 04/29/2024 0912   Hepatic Function Panel     Component  Value Date/Time   PROT 6.7 04/29/2024 0912   ALBUMIN 4.3 04/29/2024 0912   AST 18 04/29/2024 0912   ALT 15 04/29/2024 0912   ALKPHOS 81 04/29/2024 0912   BILITOT 0.3 04/29/2024 0912      Component Value Date/Time   TSH 2.450 04/29/2024 0912   Nutritional Lab Results  Component Value Date   VD25OH 27.5 (L) 04/29/2024   VD25OH 30.0 01/02/2023   VD25OH 39.6 06/01/2020     Assessment and Plan Assessment & Plan Obesity and weight management BMI is 22.4, indicating weight maintenance. Visceral fat remains at goal level of 7. Weight loss of 3 pounds since last visit, but goal is weight maintenance. Exercise regimen includes 30 minutes of treadmill and Peloton bike 5 times a week. Uncertainty about protein intake suggests possible non-adherence to dietary plan. - Continue current exercise regimen. - Ensure protein intake of 85 grams or more daily. - Consider protein supplement such as a 30-gram protein drink. - Provided handout for Angela Mcconnell Fast with modifications for protein intake.  Sarcopenia Significant muscle loss with recent increase in muscle loss. Uncertainty about protein goals may contribute to muscle loss. Exercise provides cardiovascular benefits but insufficient for muscle maintenance without adequate protein intake. - Ensure protein intake of 85 grams or more daily. - Consider protein supplement such as a 30-gram protein drink.  Emotional eating Managed with topiramate  50 mg daily. No reported issues with medication tolerance. Plans to maintain dietary habits during holidays with occasional indulgences. - Continue topiramate  50 mg daily. - Sent in refills for topiramate .      Patients who are on anti-obesity medications are counseled on the importance of maintaining healthy lifestyle habits, including balanced nutrition, regular physical activity, and behavioral modifications,  Medication is an adjunct to, not a replacement for, lifestyle changes and that the long-term  success and weight maintenance depend on continued adherence to these strategies.   Angela Mcconnell was informed of the importance of frequent follow up visits to maximize her success with intensive lifestyle modifications for her obesity and obesity related health conditions as recommended by USPSTF and CMS guidelines   Louann Penton, MD

## 2025-01-04 ENCOUNTER — Telehealth (INDEPENDENT_AMBULATORY_CARE_PROVIDER_SITE_OTHER): Admitting: Family Medicine

## 2025-01-04 ENCOUNTER — Encounter (INDEPENDENT_AMBULATORY_CARE_PROVIDER_SITE_OTHER): Payer: Self-pay | Admitting: Family Medicine

## 2025-01-04 VITALS — Ht 63.0 in | Wt 126.0 lb

## 2025-01-04 DIAGNOSIS — F3289 Other specified depressive episodes: Secondary | ICD-10-CM

## 2025-01-04 DIAGNOSIS — E669 Obesity, unspecified: Secondary | ICD-10-CM

## 2025-01-04 DIAGNOSIS — M6284 Sarcopenia: Secondary | ICD-10-CM

## 2025-01-04 DIAGNOSIS — Z6822 Body mass index (BMI) 22.0-22.9, adult: Secondary | ICD-10-CM

## 2025-01-04 DIAGNOSIS — F5089 Other specified eating disorder: Secondary | ICD-10-CM | POA: Diagnosis not present

## 2025-01-04 MED ORDER — TOPIRAMATE 50 MG PO TABS: 50.0000 mg | ORAL_TABLET | Freq: Every day | ORAL | 0 refills | Status: AC

## 2025-02-14 ENCOUNTER — Ambulatory Visit (INDEPENDENT_AMBULATORY_CARE_PROVIDER_SITE_OTHER): Admitting: Family Medicine
